# Patient Record
Sex: Female | Born: 1945 | Race: Black or African American | Hispanic: No | Marital: Single
Health system: Midwestern US, Community
[De-identification: ages and names within clinical notes are randomized; demographics above are authoritative.]

## PROBLEM LIST (undated history)

## (undated) DIAGNOSIS — R000 Tachycardia, unspecified: Secondary | ICD-10-CM

## (undated) DIAGNOSIS — I10 Essential (primary) hypertension: Secondary | ICD-10-CM

## (undated) DIAGNOSIS — E119 Type 2 diabetes mellitus without complications: Secondary | ICD-10-CM

## (undated) DIAGNOSIS — J449 Chronic obstructive pulmonary disease, unspecified: Secondary | ICD-10-CM

## (undated) DIAGNOSIS — J40 Bronchitis, not specified as acute or chronic: Secondary | ICD-10-CM

## (undated) DIAGNOSIS — M1A9XX0 Chronic gout, unspecified, without tophus (tophi): Secondary | ICD-10-CM

## (undated) HISTORY — PX: OTHER SURGICAL HISTORY: SHX169

## (undated) HISTORY — PX: CHOLECYSTECTOMY: SHX55

## (undated) HISTORY — PX: ABDOMINAL HYSTERECTOMY: SHX81

---

## 2013-03-11 ENCOUNTER — Emergency Department (HOSPITAL_BASED_OUTPATIENT_CLINIC_OR_DEPARTMENT_OTHER): Payer: Medicare Other

## 2013-03-11 ENCOUNTER — Emergency Department (HOSPITAL_BASED_OUTPATIENT_CLINIC_OR_DEPARTMENT_OTHER)
Admission: EM | Admit: 2013-03-11 | Discharge: 2013-03-11 | Disposition: A | Discharge status: Home / Self Care | Payer: Medicare Other | Attending: Emergency Medicine | Admitting: Emergency Medicine

## 2013-03-11 ENCOUNTER — Encounter (HOSPITAL_BASED_OUTPATIENT_CLINIC_OR_DEPARTMENT_OTHER): Payer: Self-pay

## 2013-03-11 DIAGNOSIS — E119 Type 2 diabetes mellitus without complications: Secondary | ICD-10-CM | POA: Insufficient documentation

## 2013-03-11 DIAGNOSIS — I10 Essential (primary) hypertension: Secondary | ICD-10-CM | POA: Insufficient documentation

## 2013-03-11 DIAGNOSIS — Z794 Long term (current) use of insulin: Secondary | ICD-10-CM | POA: Insufficient documentation

## 2013-03-11 DIAGNOSIS — Z8709 Personal history of other diseases of the respiratory system: Secondary | ICD-10-CM | POA: Insufficient documentation

## 2013-03-11 DIAGNOSIS — R0989 Other specified symptoms and signs involving the circulatory and respiratory systems: Secondary | ICD-10-CM | POA: Insufficient documentation

## 2013-03-11 DIAGNOSIS — J449 Chronic obstructive pulmonary disease, unspecified: Secondary | ICD-10-CM | POA: Insufficient documentation

## 2013-03-11 DIAGNOSIS — Z8679 Personal history of other diseases of the circulatory system: Secondary | ICD-10-CM | POA: Insufficient documentation

## 2013-03-11 DIAGNOSIS — I509 Heart failure, unspecified: Secondary | ICD-10-CM | POA: Insufficient documentation

## 2013-03-11 DIAGNOSIS — J4489 Other specified chronic obstructive pulmonary disease: Secondary | ICD-10-CM | POA: Insufficient documentation

## 2013-03-11 DIAGNOSIS — F17200 Nicotine dependence, unspecified, uncomplicated: Secondary | ICD-10-CM | POA: Insufficient documentation

## 2013-03-11 DIAGNOSIS — R0609 Other forms of dyspnea: Secondary | ICD-10-CM | POA: Insufficient documentation

## 2013-03-11 DIAGNOSIS — R0601 Orthopnea: Secondary | ICD-10-CM | POA: Insufficient documentation

## 2013-03-11 DIAGNOSIS — R0602 Shortness of breath: Secondary | ICD-10-CM | POA: Insufficient documentation

## 2013-03-11 HISTORY — DX: Chronic obstructive pulmonary disease, unspecified: J44.9

## 2013-03-11 HISTORY — DX: Bronchitis, not specified as acute or chronic: J40

## 2013-03-11 HISTORY — DX: Tachycardia, unspecified: R00.0

## 2013-03-11 HISTORY — DX: Essential (primary) hypertension: I10

## 2013-03-11 HISTORY — DX: Type 2 diabetes mellitus without complications: E11.9

## 2013-03-11 LAB — COMPREHENSIVE METABOLIC PANEL
AST: 28 U/L (ref 0–37)
Albumin: 3 g/dL — ABNORMAL LOW (ref 3.5–5.2)
Calcium: 9.3 mg/dL (ref 8.4–10.5)
Creatinine, Ser: 1.4 mg/dL — ABNORMAL HIGH (ref 0.50–1.10)
GFR calc non Af Amer: 38 mL/min — ABNORMAL LOW (ref >90)

## 2013-03-11 LAB — CBC WITH DIFFERENTIAL/PLATELET
Basophils Absolute: 0 10*3/uL (ref 0.0–0.1)
Basophils Relative: 1 % (ref 0–1)
Eosinophils Relative: 1 % (ref 0–5)
HCT: 37.5 % (ref 36.0–46.0)
MCHC: 33.9 g/dL (ref 30.0–36.0)
MCV: 87.2 fL (ref 78.0–100.0)
Monocytes Absolute: 0.4 10*3/uL (ref 0.1–1.0)
RDW: 13.2 % (ref 11.5–15.5)

## 2013-03-11 LAB — PRO B NATRIURETIC PEPTIDE: Pro B Natriuretic peptide (BNP): 19107 pg/mL — ABNORMAL HIGH (ref 0–125)

## 2013-03-11 MED ORDER — FUROSEMIDE 10 MG/ML IJ SOLN
40.0000 mg | Freq: Once | INTRAMUSCULAR | Status: AC
  Administered 2013-03-11: 40 mg via INTRAVENOUS
  Filled 2013-03-11: qty 4

## 2013-03-11 MED ORDER — FUROSEMIDE 10 MG/ML IJ SOLN: 60.0000 mg | Freq: Once | INTRAMUSCULAR | Status: DC

## 2013-03-11 MED ORDER — FUROSEMIDE 20 MG PO TABS: 20.0000 mg | ORAL_TABLET | Freq: Two times a day (BID) | ORAL | Status: DC

## 2013-03-11 NOTE — ED Provider Notes (Addendum)
History     CSN: [id number]  Arrival date & time 03/11/13  1203   First MD Initiated Contact with Patient 03/11/13 1216      Chief Complaint  Patient presents with  . Tachycardia  . Shortness of Breath  . Chest Pain     HPI Patient's had some fast heartbeat associated with shortness of breath worse the last few days.  Also has nocturnal dyspnea and orthopnea.  Patient is a smoker has COPD and has history of tachycardia.  Patient has some very brief chest pains but nothing that lasts for very long. Past Medical History  Diagnosis Date  . COPD (chronic obstructive pulmonary disease)   . Diabetes mellitus without complication   . Hypertension   . Bronchitis   . Tachycardia     Past Surgical History  Procedure Laterality Date  . Abdominal hysterectomy    . Cholecystectomy    . Spleenectomy      No family history on file.  History  Substance Use Topics  . Smoking status: Current Every Day Smoker  . Smokeless tobacco: Not on file  . Alcohol Use: No    OB History   Grav Para Term Preterm Abortions TAB SAB Ect Mult Living                  Review of Systems  Constitutional: Negative for fever.  Cardiovascular: Negative for leg swelling.    Allergies  Penicillins  Home Medications   Current Outpatient Rx  Name  Route  Sig  Dispense  Refill  . ATENOLOL PO   Oral   Take by mouth.         . insulin glargine (LANTUS) 100 UNIT/ML injection   Subcutaneous   Inject 10 Units into the skin every morning.         Marland Kitchen METFORMIN HCL PO   Oral   Take 1,000 mg by mouth.         . Pantoprazole Sodium (PROTONIX PO)   Oral   Take by mouth.         . furosemide (LASIX) 20 MG tablet   Oral   Take 1 tablet (20 mg total) by mouth 2 (two) times daily.   15 tablet   0     There were no vitals taken for this visit.  Physical Exam  Nursing note and vitals reviewed. Constitutional: She is oriented to person, place, and time. She appears well-developed and  well-nourished. No distress.  HENT:  Head: Normocephalic and atraumatic.  Eyes: Pupils are equal, round, and reactive to light.  Neck: Normal range of motion. JVD (Begins at about 45) present.  Cardiovascular: Normal rate and intact distal pulses.   Pulmonary/Chest: No respiratory distress. She has rales (Scattered at the bases).  Abdominal: Normal appearance. She exhibits no distension.  Musculoskeletal: Normal range of motion.  Neurological: She is alert and oriented to person, place, and time. No cranial nerve deficit.  Skin: Skin is warm and dry. No rash noted.  Psychiatric: She has a normal mood and affect. Her behavior is normal.    ED Course  Procedures (including critical care time)  Date: 03/11/2013  Rate: 96  Rhythm: normal sinus rhythm  QRS Axis: normal  Intervals: normal  ST/T Wave abnormalities: normal  Conduction Disutrbances: Poor R. wave progression  Narrative Interpretation: Nonspecific EKG  Meds ordered this encounter  Medications  . DISCONTD: furosemide (LASIX) injection 60 mg    Sig:   . furosemide (LASIX) injection  40 mg    Sig:   . furosemide (LASIX) 20 MG tablet    Sig: Take 1 tablet (20 mg total) by mouth 2 (two) times daily.    Dispense:  15 tablet    Refill:  0      Labs Reviewed  CBC WITH DIFFERENTIAL - Abnormal; Notable for the following:    WBC 3.4 (*)    Neutrophils Relative 30 (*)    Neutro Abs 1.0 (*)    Lymphocytes Relative 59 (*)    All other components within normal limits  COMPREHENSIVE METABOLIC PANEL - Abnormal; Notable for the following:    Sodium 128 (*)    CO2 16 (*)    Glucose, Bld 165 (*)    BUN 28 (*)    Creatinine, Ser 1.40 (*)    Albumin 3.0 (*)    GFR calc non Af Amer 38 (*)    GFR calc Af Amer 44 (*)    All other components within normal limits  PRO B NATRIURETIC PEPTIDE - Abnormal; Notable for the following:    Pro B Natriuretic peptide (BNP) 19107.0 (*)    All other components within normal limits  TROPONIN  I   Dg Chest 2 View  03/11/2013  *RADIOLOGY REPORT*  Clinical Data: Intermittent chest pain, shortness of breath on exertion, heart palpitations, history of COPD, hypertension, diabetes, chronic bronchitis, smoker, tachycardia  CHEST - 2 VIEW  Comparison: None  Findings: Minimal enlargement of cardiac silhouette. Tortuous aorta. Pulmonary vascularity normal. Lungs clear. No pleural effusion or pneumothorax. Minimal central peribronchial thickening. Bones unremarkable. Surgical clips right upper quadrant question cholecystectomy.  IMPRESSION: Minimal enlargement of cardiac silhouette and bronchitic changes. No acute abnormalities.   Original Report Authenticated By: Ulyses Southward, M.D.      1. CHF (congestive heart failure)       MDM  I discussed the findings with the patient and recommended that she be admitted.  Patient refused admission to the hospital but states she would follow up with her doctor tomorrow.  I did give her some Lasix and prescribe some Lasix prior to discharge.  The patient is comfortable with our plan.       Nelia Shi, MD 03/11/13 1346  Nelia Shi, MD 03/11/13 [phone]

## 2013-03-11 NOTE — ED Notes (Signed)
Pt. Has vague c/o palpitations and chest pain.  States she has this constantly since 1991.  C/o SHOB.  Denies n/v.  Denies diaphoresis.

## 2013-03-11 NOTE — ED Notes (Signed)
Pt reports she has a fast heartbeat all the time and "since 1987".  The past 2 days she reports intermittent chest pain, SHOB on exertion and heart palpitations.

## 2014-10-19 DIAGNOSIS — E0500 Thyrotoxicosis with diffuse goiter without thyrotoxic crisis or storm: Secondary | ICD-10-CM | POA: Insufficient documentation

## 2016-11-03 DIAGNOSIS — R6510 Systemic inflammatory response syndrome (SIRS) of non-infectious origin without acute organ dysfunction: Secondary | ICD-10-CM | POA: Insufficient documentation

## 2016-11-03 DIAGNOSIS — I10 Essential (primary) hypertension: Secondary | ICD-10-CM | POA: Insufficient documentation

## 2016-11-03 DIAGNOSIS — N189 Chronic kidney disease, unspecified: Secondary | ICD-10-CM | POA: Insufficient documentation

## 2016-11-03 DIAGNOSIS — I509 Heart failure, unspecified: Secondary | ICD-10-CM | POA: Insufficient documentation

## 2016-11-03 DIAGNOSIS — E119 Type 2 diabetes mellitus without complications: Secondary | ICD-10-CM | POA: Insufficient documentation

## 2016-11-03 DIAGNOSIS — J984 Other disorders of lung: Secondary | ICD-10-CM | POA: Insufficient documentation

## 2016-11-04 DIAGNOSIS — J9601 Acute respiratory failure with hypoxia: Secondary | ICD-10-CM | POA: Insufficient documentation

## 2016-11-04 DIAGNOSIS — J449 Chronic obstructive pulmonary disease, unspecified: Secondary | ICD-10-CM | POA: Insufficient documentation

## 2018-02-13 ENCOUNTER — Other Ambulatory Visit: Payer: Self-pay

## 2018-02-13 ENCOUNTER — Encounter (HOSPITAL_BASED_OUTPATIENT_CLINIC_OR_DEPARTMENT_OTHER): Payer: Self-pay | Admitting: *Deleted

## 2018-02-13 ENCOUNTER — Emergency Department (HOSPITAL_BASED_OUTPATIENT_CLINIC_OR_DEPARTMENT_OTHER): Payer: Medicare Other

## 2018-02-13 ENCOUNTER — Emergency Department (HOSPITAL_BASED_OUTPATIENT_CLINIC_OR_DEPARTMENT_OTHER)
Admission: EM | Admit: 2018-02-13 | Discharge: 2018-02-13 | Discharge status: Against Medical Advice | Payer: Medicare Other | Attending: Emergency Medicine | Admitting: Emergency Medicine

## 2018-02-13 DIAGNOSIS — F17200 Nicotine dependence, unspecified, uncomplicated: Secondary | ICD-10-CM | POA: Insufficient documentation

## 2018-02-13 DIAGNOSIS — Z7982 Long term (current) use of aspirin: Secondary | ICD-10-CM | POA: Diagnosis not present

## 2018-02-13 DIAGNOSIS — J449 Chronic obstructive pulmonary disease, unspecified: Secondary | ICD-10-CM | POA: Diagnosis not present

## 2018-02-13 DIAGNOSIS — Z9049 Acquired absence of other specified parts of digestive tract: Secondary | ICD-10-CM | POA: Insufficient documentation

## 2018-02-13 DIAGNOSIS — Z794 Long term (current) use of insulin: Secondary | ICD-10-CM | POA: Insufficient documentation

## 2018-02-13 DIAGNOSIS — R1010 Upper abdominal pain, unspecified: Secondary | ICD-10-CM | POA: Diagnosis not present

## 2018-02-13 DIAGNOSIS — E119 Type 2 diabetes mellitus without complications: Secondary | ICD-10-CM | POA: Diagnosis not present

## 2018-02-13 DIAGNOSIS — R1013 Epigastric pain: Secondary | ICD-10-CM | POA: Diagnosis present

## 2018-02-13 DIAGNOSIS — I10 Essential (primary) hypertension: Secondary | ICD-10-CM | POA: Diagnosis not present

## 2018-02-13 DIAGNOSIS — Z79899 Other long term (current) drug therapy: Secondary | ICD-10-CM | POA: Insufficient documentation

## 2018-02-13 LAB — CBC WITH DIFFERENTIAL/PLATELET
Basophils Absolute: 0 10*3/uL (ref 0.0–0.1)
Basophils Relative: 0 %
EOS ABS: 0 10*3/uL (ref 0.0–0.7)
EOS PCT: 0 %
HCT: 40.5 % (ref 36.0–46.0)
HEMOGLOBIN: 14.2 g/dL (ref 12.0–15.0)
LYMPHS ABS: 2 10*3/uL (ref 0.7–4.0)
LYMPHS PCT: 27 %
MCH: 31.6 pg (ref 26.0–34.0)
MCHC: 35.1 g/dL (ref 30.0–36.0)
MCV: 90.2 fL (ref 78.0–100.0)
MONOS PCT: 8 %
Monocytes Absolute: 0.6 10*3/uL (ref 0.1–1.0)
Neutro Abs: 4.6 10*3/uL (ref 1.7–7.7)
Neutrophils Relative %: 65 %
PLATELETS: 198 10*3/uL (ref 150–400)
RBC: 4.49 MIL/uL (ref 3.87–5.11)
RDW: 15.1 % (ref 11.5–15.5)
WBC: 7.2 10*3/uL (ref 4.0–10.5)

## 2018-02-13 LAB — COMPREHENSIVE METABOLIC PANEL
ALK PHOS: 83 U/L (ref 38–126)
ALT: 17 U/L (ref 14–54)
ANION GAP: 8 (ref 5–15)
AST: 21 U/L (ref 15–41)
Albumin: 3.5 g/dL (ref 3.5–5.0)
BILIRUBIN TOTAL: 0.8 mg/dL (ref 0.3–1.2)
BUN: 61 mg/dL — ABNORMAL HIGH (ref 6–20)
CALCIUM: 9.1 mg/dL (ref 8.9–10.3)
CO2: 21 mmol/L — ABNORMAL LOW (ref 22–32)
Chloride: 98 mmol/L — ABNORMAL LOW (ref 101–111)
Creatinine, Ser: 2.02 mg/dL — ABNORMAL HIGH (ref 0.44–1.00)
GFR, EST AFRICAN AMERICAN: 27 mL/min — AB (ref >60)
GFR, EST NON AFRICAN AMERICAN: 23 mL/min — AB (ref >60)
Glucose, Bld: 186 mg/dL — ABNORMAL HIGH (ref 65–99)
Potassium: 5.6 mmol/L — ABNORMAL HIGH (ref 3.5–5.1)
SODIUM: 127 mmol/L — AB (ref 135–145)
Total Protein: 8.1 g/dL (ref 6.5–8.1)

## 2018-02-13 LAB — I-STAT CG4 LACTIC ACID, ED: Lactic Acid, Venous: 0.8 mmol/L (ref 0.5–1.9)

## 2018-02-13 LAB — LIPASE, BLOOD: LIPASE: 99 U/L — AB (ref 11–51)

## 2018-02-13 LAB — CBG MONITORING, ED: GLUCOSE-CAPILLARY: 105 mg/dL — AB (ref 65–99)

## 2018-02-13 LAB — TROPONIN I: Troponin I: <0.03 ng/mL (ref <0.03)

## 2018-02-13 MED ORDER — FENTANYL CITRATE (PF) 100 MCG/2ML IJ SOLN
25.0000 ug | Freq: Once | INTRAMUSCULAR | Status: AC
  Administered 2018-02-13: 25 ug via INTRAVENOUS
  Filled 2018-02-13: qty 2

## 2018-02-13 MED ORDER — INSULIN ASPART 100 UNIT/ML IV SOLN
10.0000 [IU] | Freq: Once | INTRAVENOUS | Status: AC
Start: 2018-02-13 — End: 2018-02-13
  Administered 2018-02-13: 10 [IU] via INTRAVENOUS
  Filled 2018-02-13: qty 1

## 2018-02-13 MED ORDER — DEXTROSE 50 % IV SOLN
1.0000 | Freq: Once | INTRAVENOUS | Status: AC
  Administered 2018-02-13: 50 mL via INTRAVENOUS
  Filled 2018-02-13: qty 50

## 2018-02-13 MED ORDER — SODIUM BICARBONATE 8.4 % IV SOLN
INTRAVENOUS | Status: AC
  Filled 2018-02-13: qty 50

## 2018-02-13 MED ORDER — SODIUM CHLORIDE 0.9 % IV SOLN
INTRAVENOUS | Status: AC
  Filled 2018-02-13: qty 10

## 2018-02-13 MED ORDER — SODIUM CHLORIDE 0.9 % IV SOLN
1.0000 g | Freq: Once | INTRAVENOUS | Status: AC
  Administered 2018-02-13: 1 g via INTRAVENOUS
  Filled 2018-02-13: qty 10

## 2018-02-13 MED ORDER — SODIUM BICARBONATE 8.4 % IV SOLN
50.0000 meq | Freq: Once | INTRAVENOUS | Status: AC
Start: 2018-02-13 — End: 2018-02-13
  Administered 2018-02-13: 50 meq via INTRAVENOUS
  Filled 2018-02-13: qty 50

## 2018-02-13 MED ORDER — ONDANSETRON HCL 4 MG/2ML IJ SOLN
4.0000 mg | Freq: Once | INTRAMUSCULAR | Status: AC
  Administered 2018-02-13: 4 mg via INTRAVENOUS
  Filled 2018-02-13: qty 2

## 2018-02-13 MED ORDER — SODIUM CHLORIDE 0.9 % IV BOLUS (SEPSIS)
1000.0000 mL | Freq: Once | INTRAVENOUS | Status: AC
  Administered 2018-02-13: 1000 mL via INTRAVENOUS

## 2018-02-13 MED ORDER — SODIUM CHLORIDE 0.9 % IV BOLUS (SEPSIS): 500.0000 mL | Freq: Once | INTRAVENOUS | Status: DC

## 2018-02-13 NOTE — ED Provider Notes (Signed)
[facility] EMERGENCY DEPARTMENT Provider Note   CSN: [id number] Arrival date & time: 02/13/18  1003     History   Chief Complaint Chief Complaint  Patient presents with  . Abdominal Pain    HPI Joyce Palmer is a 72 y.o. female.  The history is provided by the patient and medical records. No language interpreter was used.  Abdominal Pain   Associated symptoms include nausea. Pertinent negatives include diarrhea, vomiting and constipation.  Joyce Palmer is a 72 y.o. female  with a PMH of prior episode of pancreatitis leading to cholecystectomy, hypertension, diabetes, possible chronic kidney disease, congestive heart failure unknown ejection fraction, Graves' diseasewho presents to the Emergency Department complaining of epigastric pain over the last 2 weeks. Unable to characterize pain. Feels as if pain has gotten worse over the last 3-4 days. She has not been taking any medication other than her Zantac due to pain. Pain is worse with eating. She is on Atenolol and has not taken this medication in 2 weeks, again due to the pain. She has been nauseous, but not having any vomiting or diarrhea. No fever, chills, shortness of breath, chest pain.    Past Medical History:  Diagnosis Date  . Bronchitis   . COPD (chronic obstructive pulmonary disease) (HCC)   . Diabetes mellitus without complication (HCC)   . Hypertension   . Tachycardia     There are no active problems to display for this patient.   Past Surgical History:  Procedure Laterality Date  . ABDOMINAL HYSTERECTOMY    . CHOLECYSTECTOMY    . spleenectomy      OB History    No data available       Home Medications    Prior to Admission medications   Medication Sig Start Date End Date Taking? Authorizing Provider  amitriptyline (ELAVIL) 50 MG tablet Take 50 mg by mouth at bedtime.   Yes [provider]  aspirin 325 MG tablet Take 325 mg by mouth daily.   Yes [provider]    meloxicam (MOBIC) 15 MG tablet Take 15 mg by mouth daily.   Yes [provider]  methocarbamol (ROBAXIN) 500 MG tablet Take 500 mg by mouth 4 (four) times daily.   Yes [provider]  oxyCODONE-acetaminophen (PERCOCET) 7.5-325 MG tablet Take 1 tablet by mouth every 4 (four) hours as needed for severe pain.   Yes [provider]  pioglitazone (ACTOS) 30 MG tablet Take 30 mg by mouth daily.   Yes [provider]  ranitidine (ZANTAC) 150 MG capsule Take 150 mg by mouth 2 (two) times daily.   Yes [provider]  sertraline (ZOLOFT) 50 MG tablet Take 50 mg by mouth daily.   Yes [provider]  Vitamin D, Ergocalciferol, (DRISDOL) 50000 units CAPS capsule Take 50,000 Units by mouth every 7 (seven) days.   Yes [provider]  ATENOLOL PO Take by mouth.    [provider]  furosemide (LASIX) 20 MG tablet Take 1 tablet (20 mg total) by mouth 2 (two) times daily. 03/11/13   Nelva NayBeaton, Robert, MD  insulin glargine (LANTUS) 100 UNIT/ML injection Inject 10 Units into the skin every morning.    [provider]  METFORMIN HCL PO Take 1,000 mg by mouth.    [provider]  Pantoprazole Sodium (PROTONIX PO) Take by mouth.    [provider]    Family History History reviewed. No pertinent family history.  Social History Social  History   Tobacco Use  . Smoking status: Current Every Day Smoker  . Smokeless tobacco: Never Used  Substance Use Topics  . Alcohol use: No  . Drug use: No     Allergies   Penicillins   Review of Systems Review of Systems  Gastrointestinal: Positive for abdominal pain and nausea. Negative for constipation, diarrhea and vomiting.  All other systems reviewed and are negative.    Physical Exam Updated Vital Signs BP (!) 150/84   Pulse (!) 111   Temp 98.1 F (36.7 C) (Oral)   Resp 17   SpO2 95%   Physical Exam  Constitutional: She is oriented to person, place, and  time. She appears well-developed and well-nourished. No distress.  HENT:  Head: Normocephalic and atraumatic.  Neck: Neck supple.  Cardiovascular: Normal rate, regular rhythm and normal heart sounds.  No murmur heard. Pulmonary/Chest: Effort normal and breath sounds normal. No respiratory distress.  Abdominal: Soft. She exhibits no distension.  Diffuse tenderness to palpation of the upper abdomen. Negative Murphy's. No rebound or guarding.  Neurological: She is alert and oriented to person, place, and time.  Skin: Skin is warm and dry.  Nursing note and vitals reviewed.    ED Treatments / Results  Labs (all labs ordered are listed, but only abnormal results are displayed) Labs Reviewed  COMPREHENSIVE METABOLIC PANEL - Abnormal; Notable for the following components:      Result Value   Sodium 127 (*)    Potassium 5.6 (*)    Chloride 98 (*)    CO2 21 (*)    Glucose, Bld 186 (*)    BUN 61 (*)    Creatinine, Ser 2.02 (*)    GFR calc non Af Amer 23 (*)    GFR calc Af Amer 27 (*)    All other components within normal limits  LIPASE, BLOOD - Abnormal; Notable for the following components:   Lipase 99 (*)    All other components within normal limits  CBG MONITORING, ED - Abnormal; Notable for the following components:   Glucose-Capillary 105 (*)    All other components within normal limits  CBC WITH DIFFERENTIAL/PLATELET  TROPONIN I  I-STAT CG4 LACTIC ACID, ED  I-STAT CG4 LACTIC ACID, ED    EKG  EKG Interpretation  Date/Time:  Thursday February 13 2018 10:22:43 EST Ventricular Rate:  126 PR Interval:    QRS Duration: 86 QT Interval:  309 QTC Calculation: 448 R Axis:   36 Text Interpretation:  Sinus tachycardia Confirmed by Raeford Razor [phone]) on 02/13/2018 11:54:39 AM       Radiology Ct Abdomen Pelvis Wo Contrast  Result Date: 02/13/2018 CLINICAL DATA:  Acute epigastric abdominal pain. EXAM: CT ABDOMEN AND PELVIS WITHOUT CONTRAST TECHNIQUE: Multidetector CT imaging  of the abdomen and pelvis was performed following the standard protocol without IV contrast. COMPARISON:  None. FINDINGS: Lower chest: No acute abnormality. Hepatobiliary: No focal liver abnormality is seen. Status post cholecystectomy. No biliary dilatation. Pancreas: Unremarkable. No pancreatic ductal dilatation or surrounding inflammatory changes. Spleen: Normal in size without focal abnormality. Adrenals/Urinary Tract: Adrenal glands are unremarkable. Small left-sided renal stone is noted. Exophytic left renal cyst is noted. No hydronephrosis or renal obstruction is noted. Urinary bladder is unremarkable. Stomach/Bowel: Stomach is within normal limits. Appendix appears normal. No evidence of bowel wall thickening, distention, or inflammatory changes. Vascular/Lymphatic: Aortic atherosclerosis. No enlarged abdominal or pelvic lymph nodes. Reproductive: Status post hysterectomy. No adnexal masses. Other: No abdominal wall hernia or abnormality.  No abdominopelvic ascites. Musculoskeletal: No acute or significant osseous findings. IMPRESSION: Nonobstructive left nephrolithiasis. No hydronephrosis or renal obstruction is noted. No acute abnormality seen in the abdomen or pelvis. Aortic Atherosclerosis (ICD10-I70.0). Electronically Signed   By: Lupita Raider, M.D.   On: 02/13/2018 15:18    Procedures Procedures (including critical care time)  Medications Ordered in ED Medications  sodium chloride 0.9 % with calcium gluconate ADS Med (not administered)  ondansetron (ZOFRAN) injection 4 mg (4 mg Intravenous Given 02/13/18 1128)  fentaNYL (SUBLIMAZE) injection 25 mcg (25 mcg Intravenous Given 02/13/18 1128)  sodium chloride 0.9 % bolus 1,000 mL (0 mLs Intravenous Stopped 02/13/18 1522)  calcium chloride 1 g in sodium chloride 0.9 % 100 mL IVPB (0 g Intravenous Stopped 02/13/18 1356)  sodium bicarbonate injection 50 mEq (50 mEq Intravenous Given 02/13/18 1343)  insulin aspart (novoLOG) injection 10 Units (10 Units  Intravenous Given 02/13/18 1344)  dextrose 50 % solution 50 mL (50 mLs Intravenous Given 02/13/18 1344)     Initial Impression / Assessment and Plan / ED Course  I have reviewed the triage vital signs and the nursing notes.  Pertinent labs & imaging results that were available during my care of the patient were reviewed by me and considered in my medical decision making (see chart for details).    JASLYNE BEECK is a 72 y.o. female who presents to ED for upper abdominal pain which has been worsening over the last 2 weeks. She is tachycardic. She has not taken atenolol in the last two weeks, so tachycardia could be related to this vs. Infectious etiology. She is afebrile in ED today. She has tenderness across upper abdomen but no peritoneal signs. Will obtain labs and CT scan for further evaluation.   Lactic and wbc count normal.  CMP reviewed with multiple abnormalities. BUN and Cr elevated. Hyponatremic at 127 and hyperkalemia at 5.6. Fluids, calcium, insulin/dextrose and bicarb given for hyperkalemia.   CT with no acute findings. Given multiple lab abnormalities and persistent symptoms, recommended admission.   Patient seen by and discussed with Dr. Juleen China who agrees with treatment plan.   Discussed admission recommendations with patient who would like to go home.   I have discussed my concerns as a provider and the possibility that this may worsen. I spoke with family in the room as well. We discussed the nature, risks and benefits, and alternatives to treatment. I have specifically discussed that without further evaluation I cannot guarantee there is not a life threatening event occuring.  Time was given to allow the opportunity to ask questions. I gave patient time to discuss option with family as well and after the discussion, the patient decided to refuse admission. Pt is A&Ox4, her own POA and states understanding of my concerns and the possible consequences. After refusal, I made every  reasonable opportunity to treat them to the best of my ability. I discussed that she must  I have made the patient aware that this is an AMA discharge, but he may return at any time for further evaluation and treatment.   Discussed plan of care change with oncoming attending, Dr. Rush Landmark who agrees with plan as well.           Final Clinical Impressions(s) / ED Diagnoses   Final diagnoses:  Pain of upper abdomen    ED Discharge Orders    None       Cataleya Cristina, Chase Picket, PA-C 02/13/18 1751    Raeford Razor, MD  02/14/18 0813  

## 2018-02-13 NOTE — ED Triage Notes (Signed)
Pt reports epigastric pains with nauea, no vomitting, states she is having good bm's "2 today". ekg performed while pt being triaged, hr noted at 130's. Pt denies any cp,denies any sob beyond her usual sob. md at bedside for eval.

## 2018-02-13 NOTE — Discharge Instructions (Signed)
It was my pleasure taking care of you today!   You must follow up with your primary care doctor for recheck of your lab work today. I especially want them to recheck your potassium and kidney function.   Return to ER immediately for fever, vomiting, new or worsening symptoms or any additional concerns. As we discussed, you can return to ER at any point!

## 2021-01-17 DIAGNOSIS — E871 Hypo-osmolality and hyponatremia: Secondary | ICD-10-CM | POA: Insufficient documentation

## 2021-01-25 ENCOUNTER — Encounter (HOSPITAL_BASED_OUTPATIENT_CLINIC_OR_DEPARTMENT_OTHER): Payer: Self-pay | Admitting: Radiology

## 2021-01-25 ENCOUNTER — Other Ambulatory Visit: Payer: Self-pay

## 2021-01-25 ENCOUNTER — Emergency Department (HOSPITAL_BASED_OUTPATIENT_CLINIC_OR_DEPARTMENT_OTHER): Payer: Medicare Other

## 2021-01-25 ENCOUNTER — Inpatient Hospital Stay (HOSPITAL_BASED_OUTPATIENT_CLINIC_OR_DEPARTMENT_OTHER)
Admission: EM | Admit: 2021-01-25 | Discharge: 2021-01-30 | DRG: 175 | Disposition: A | Discharge status: Home Health | Payer: Medicare Other | Attending: Internal Medicine | Admitting: Internal Medicine

## 2021-01-25 DIAGNOSIS — Z9049 Acquired absence of other specified parts of digestive tract: Secondary | ICD-10-CM

## 2021-01-25 DIAGNOSIS — R4701 Aphasia: Secondary | ICD-10-CM | POA: Diagnosis present

## 2021-01-25 DIAGNOSIS — R4182 Altered mental status, unspecified: Secondary | ICD-10-CM

## 2021-01-25 DIAGNOSIS — R531 Weakness: Secondary | ICD-10-CM

## 2021-01-25 DIAGNOSIS — E876 Hypokalemia: Secondary | ICD-10-CM | POA: Diagnosis present

## 2021-01-25 DIAGNOSIS — I110 Hypertensive heart disease with heart failure: Secondary | ICD-10-CM | POA: Diagnosis present

## 2021-01-25 DIAGNOSIS — E860 Dehydration: Secondary | ICD-10-CM | POA: Diagnosis present

## 2021-01-25 DIAGNOSIS — I071 Rheumatic tricuspid insufficiency: Secondary | ICD-10-CM | POA: Diagnosis present

## 2021-01-25 DIAGNOSIS — Z9081 Acquired absence of spleen: Secondary | ICD-10-CM

## 2021-01-25 DIAGNOSIS — Z20822 Contact with and (suspected) exposure to covid-19: Secondary | ICD-10-CM | POA: Diagnosis present

## 2021-01-25 DIAGNOSIS — E440 Moderate protein-calorie malnutrition: Secondary | ICD-10-CM | POA: Diagnosis present

## 2021-01-25 DIAGNOSIS — R000 Tachycardia, unspecified: Secondary | ICD-10-CM | POA: Diagnosis present

## 2021-01-25 DIAGNOSIS — F419 Anxiety disorder, unspecified: Secondary | ICD-10-CM | POA: Diagnosis present

## 2021-01-25 DIAGNOSIS — E039 Hypothyroidism, unspecified: Secondary | ICD-10-CM | POA: Diagnosis present

## 2021-01-25 DIAGNOSIS — J449 Chronic obstructive pulmonary disease, unspecified: Secondary | ICD-10-CM | POA: Diagnosis present

## 2021-01-25 DIAGNOSIS — I2693 Single subsegmental pulmonary embolism without acute cor pulmonale: Principal | ICD-10-CM | POA: Diagnosis present

## 2021-01-25 DIAGNOSIS — M25561 Pain in right knee: Secondary | ICD-10-CM | POA: Diagnosis present

## 2021-01-25 DIAGNOSIS — F17200 Nicotine dependence, unspecified, uncomplicated: Secondary | ICD-10-CM | POA: Diagnosis present

## 2021-01-25 DIAGNOSIS — G8929 Other chronic pain: Secondary | ICD-10-CM | POA: Diagnosis present

## 2021-01-25 DIAGNOSIS — J9601 Acute respiratory failure with hypoxia: Secondary | ICD-10-CM | POA: Diagnosis not present

## 2021-01-25 DIAGNOSIS — E871 Hypo-osmolality and hyponatremia: Secondary | ICD-10-CM | POA: Diagnosis present

## 2021-01-25 DIAGNOSIS — R5381 Other malaise: Secondary | ICD-10-CM | POA: Diagnosis present

## 2021-01-25 DIAGNOSIS — Z681 Body mass index (BMI) 19 or less, adult: Secondary | ICD-10-CM

## 2021-01-25 DIAGNOSIS — Z7984 Long term (current) use of oral hypoglycemic drugs: Secondary | ICD-10-CM

## 2021-01-25 DIAGNOSIS — Z79899 Other long term (current) drug therapy: Secondary | ICD-10-CM

## 2021-01-25 DIAGNOSIS — E119 Type 2 diabetes mellitus without complications: Secondary | ICD-10-CM | POA: Diagnosis present

## 2021-01-25 DIAGNOSIS — R471 Dysarthria and anarthria: Secondary | ICD-10-CM | POA: Diagnosis present

## 2021-01-25 DIAGNOSIS — G252 Other specified forms of tremor: Secondary | ICD-10-CM | POA: Diagnosis present

## 2021-01-25 DIAGNOSIS — G9341 Metabolic encephalopathy: Secondary | ICD-10-CM | POA: Diagnosis present

## 2021-01-25 DIAGNOSIS — E785 Hyperlipidemia, unspecified: Secondary | ICD-10-CM | POA: Diagnosis present

## 2021-01-25 DIAGNOSIS — R52 Pain, unspecified: Secondary | ICD-10-CM

## 2021-01-25 DIAGNOSIS — Z90710 Acquired absence of both cervix and uterus: Secondary | ICD-10-CM

## 2021-01-25 DIAGNOSIS — N179 Acute kidney failure, unspecified: Secondary | ICD-10-CM | POA: Diagnosis present

## 2021-01-25 DIAGNOSIS — F32A Depression, unspecified: Secondary | ICD-10-CM | POA: Diagnosis present

## 2021-01-25 DIAGNOSIS — I5032 Chronic diastolic (congestive) heart failure: Secondary | ICD-10-CM | POA: Diagnosis present

## 2021-01-25 LAB — HEPATIC FUNCTION PANEL
ALT: 13 U/L (ref 0–44)
AST: 20 U/L (ref 15–41)
Albumin: 3.2 g/dL — ABNORMAL LOW (ref 3.5–5.0)
Alkaline Phosphatase: 50 U/L (ref 38–126)
Bilirubin, Direct: 0.4 mg/dL — ABNORMAL HIGH (ref 0.0–0.2)
Indirect Bilirubin: 0.7 mg/dL (ref 0.3–0.9)
Total Bilirubin: 1.1 mg/dL (ref 0.3–1.2)
Total Protein: 7.9 g/dL (ref 6.5–8.1)

## 2021-01-25 LAB — BASIC METABOLIC PANEL
Anion gap: 11 (ref 5–15)
BUN: 21 mg/dL (ref 8–23)
CO2: 25 mmol/L (ref 22–32)
Calcium: 8.7 mg/dL — ABNORMAL LOW (ref 8.9–10.3)
Chloride: 95 mmol/L — ABNORMAL LOW (ref 98–111)
Creatinine, Ser: 1.14 mg/dL — ABNORMAL HIGH (ref 0.44–1.00)
GFR, Estimated: 50 mL/min — ABNORMAL LOW (ref >60)
Glucose, Bld: 139 mg/dL — ABNORMAL HIGH (ref 70–99)
Potassium: 4.3 mmol/L (ref 3.5–5.1)
Sodium: 131 mmol/L — ABNORMAL LOW (ref 135–145)

## 2021-01-25 LAB — URINALYSIS, ROUTINE W REFLEX MICROSCOPIC
Bilirubin Urine: NEGATIVE
Glucose, UA: NEGATIVE mg/dL
Hgb urine dipstick: NEGATIVE
Ketones, ur: NEGATIVE mg/dL
Leukocytes,Ua: NEGATIVE
Nitrite: NEGATIVE
Protein, ur: NEGATIVE mg/dL
Specific Gravity, Urine: 1.005 (ref 1.005–1.030)
pH: 7.5 (ref 5.0–8.0)

## 2021-01-25 LAB — RESP PANEL BY RT-PCR (FLU A&B, COVID) ARPGX2
Influenza A by PCR: NEGATIVE
Influenza B by PCR: NEGATIVE
SARS Coronavirus 2 by RT PCR: NEGATIVE

## 2021-01-25 LAB — CBC
HCT: 37.7 % (ref 36.0–46.0)
Hemoglobin: 12.5 g/dL (ref 12.0–15.0)
MCH: 29.2 pg (ref 26.0–34.0)
MCHC: 33.2 g/dL (ref 30.0–36.0)
MCV: 88.1 fL (ref 80.0–100.0)
Platelets: 270 10*3/uL (ref 150–400)
RBC: 4.28 MIL/uL (ref 3.87–5.11)
RDW: 18.1 % — ABNORMAL HIGH (ref 11.5–15.5)
WBC: 7.4 10*3/uL (ref 4.0–10.5)
nRBC: 0 % (ref 0.0–0.2)

## 2021-01-25 LAB — CBG MONITORING, ED: Glucose-Capillary: 135 mg/dL — ABNORMAL HIGH (ref 70–99)

## 2021-01-25 LAB — TROPONIN I (HIGH SENSITIVITY): Troponin I (High Sensitivity): 9 ng/L (ref <18)

## 2021-01-25 LAB — LACTIC ACID, PLASMA: Lactic Acid, Venous: 0.8 mmol/L (ref 0.5–1.9)

## 2021-01-25 MED ORDER — SODIUM CHLORIDE 0.9 % IV BOLUS
1000.0000 mL | Freq: Once | INTRAVENOUS | Status: AC
  Administered 2021-01-25: 1000 mL via INTRAVENOUS

## 2021-01-25 MED ORDER — IOHEXOL 350 MG/ML SOLN
75.0000 mL | Freq: Once | INTRAVENOUS | Status: AC | PRN
  Administered 2021-01-25: 75 mL via INTRAVENOUS

## 2021-01-25 NOTE — ED Notes (Signed)
Niece Misty Stanley updated on patient. [phone]

## 2021-01-25 NOTE — ED Triage Notes (Addendum)
Pt arrives pov with niece with report of right side weakness since 0730 today. Dr Clarene Duke notified

## 2021-01-25 NOTE — ED Notes (Signed)
1# contact Steward Drone  [phone]

## 2021-01-25 NOTE — ED Notes (Signed)
Pt awake alert called out for bedpan

## 2021-01-25 NOTE — Progress Notes (Signed)
Patient accepted to [facility]. [facility] hospitalists to assume care upon arrival to accepting facility.

## 2021-01-25 NOTE — ED Notes (Signed)
Patient transported to CT 

## 2021-01-25 NOTE — ED Provider Notes (Signed)
[facility] EMERGENCY DEPARTMENT Provider Note   CSN: [id number] Arrival date & time: 01/25/21  1504     History Chief Complaint  Patient presents with  . Weakness    Joyce Palmer is a 75 y.o. female PMH/o COPD, DM, HTN who presents for evaluation of weakness.  Patient is accompanied by her niece who states that another aunt came and checked on the patient this morning around 730 and noted her to be weak.  They reported that she was sitting on the toilet and was having difficulty getting up because she was so weak.  They state that this is not her normal baseline.  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  They do not know when she was last seen normal.  They do not know she has fallen.  They are unsure if she is on a blood thinner.  Patient states she just feels "bad."  She has trouble verbalizing exactly what she means.  She does feel like her chest is hurting and she feels nauseous.  She denies any abdominal pain.  EM LEVEL 5 CAVEAT DUE TO ACUITY TO CONDITION   The history is provided by the patient.       Past Medical History:  Diagnosis Date  . Bronchitis   . COPD (chronic obstructive pulmonary disease) (HCC)   . Diabetes mellitus without complication (HCC)   . Hypertension   . Tachycardia     Patient Active Problem List   Diagnosis Date Noted  . Acute metabolic encephalopathy 01/25/2021    Past Surgical History:  Procedure Laterality Date  . ABDOMINAL HYSTERECTOMY    . CHOLECYSTECTOMY    . spleenectomy       OB History   No obstetric history on file.     No family history on file.  Social History   Tobacco Use  . Smoking status: Current Every Day Smoker  . Smokeless tobacco: Never Used  Substance Use Topics  . Alcohol use: No  . Drug use: No    Home Medications Prior to Admission medications   Medication Sig Start Date End Date Taking? Authorizing  Provider  alendronate (FOSAMAX) 70 MG tablet Take by mouth.    [provider]  allopurinol (ZYLOPRIM) 100 MG tablet Take by mouth.    [provider]  amitriptyline (ELAVIL) 50 MG tablet Take 50 mg by mouth at bedtime.    [provider]  aspirin 325 MG tablet Take 325 mg by mouth daily.    [provider]  ATENOLOL PO Take by mouth.    [provider]  furosemide (LASIX) 20 MG tablet Take 1 tablet (20 mg total) by mouth 2 (two) times daily. 03/11/13   Nelva Nay, MD  insulin glargine (LANTUS) 100 UNIT/ML injection Inject 10 Units into the skin every morning.    [provider]  meloxicam (MOBIC) 15 MG tablet Take 15 mg by mouth daily.    [provider]  METFORMIN HCL PO Take 1,000 mg by mouth.    [provider]  methocarbamol (ROBAXIN) 500 MG tablet Take 500 mg by mouth 4 (four) times daily.    [provider]  oxyCODONE-acetaminophen (PERCOCET) 7.5-325 MG tablet Take 1 tablet by mouth every 4 (four) hours as needed for severe pain.    [provider]  Pantoprazole Sodium (PROTONIX PO) Take by mouth.    [provider]  pioglitazone (ACTOS) 30 MG tablet Take 30 mg by mouth daily.    [provider]  ranitidine (ZANTAC) 150 MG capsule Take 150 mg by mouth 2 (two) times daily.    [provider]  sertraline (ZOLOFT) 50 MG tablet Take 50 mg by mouth daily.    [provider]  Vitamin D, Ergocalciferol, (DRISDOL) 50000 units CAPS capsule Take 50,000 Units by mouth every 7 (seven) days.    [provider]    Allergies    Penicillins  Review of Systems   Review of Systems  Unable to perform ROS: Acuity of condition    Physical Exam Updated Vital Signs BP 129/86 (BP Location: Right Arm)   Pulse (!) 120   Temp 98.5 F (36.9 C) (Rectal) Comment: assisted with EMT Tanya  Resp 12   Wt 48.1 kg   SpO2 98%   Physical Exam Vitals and nursing note  reviewed.  Constitutional:      Appearance: Normal appearance. She is well-developed and well-nourished.     Comments: Frail and elderly appearing.   HENT:     Head: Normocephalic and atraumatic.     Mouth/Throat:     Mouth: Oropharynx is clear and moist and mucous membranes are normal.  Eyes:     General: Lids are normal.     Extraocular Movements: EOM normal.     Conjunctiva/sclera: Conjunctivae normal.     Pupils: Pupils are equal, round, and reactive to light.     Comments: PERRL. EOMs intact. No nystagmus. No neglect.   Cardiovascular:     Rate and Rhythm: Regular rhythm. Tachycardia present.     Pulses: Normal pulses.     Heart sounds: Normal heart sounds. No murmur heard. No friction rub. No gallop.   Pulmonary:     Effort: Pulmonary effort is normal.     Breath sounds: Normal breath sounds.     Comments: Limited evaluation due to poor effort. No obvious wheezing.  Abdominal:     Palpations: Abdomen is soft. Abdomen is not rigid.     Tenderness: There is no abdominal tenderness. There is no guarding.     Comments: Abdomen is soft, non-distended, non-tender. No rigidity, No guarding. No peritoneal signs.  Musculoskeletal:        General: Normal range of motion.     Cervical back: Full passive range of motion without pain.  Skin:    General: Skin is warm and dry.     Capillary Refill: Capillary refill takes less than 2 seconds.  Neurological:     Mental Status: She is alert.     Comments: Alert and oriented x2.  She can tell me her name and where she is at but when asked her what year she can states "2, 2, 2, 2." Generalized weakness with no focal point.  Equal and symmetric grip strength.  Equal strength noted to bilateral upper and lower extremities.  Difficulty obtaining full neuro exam secondary to patient's cooperation.  Psychiatric:        Mood and Affect: Mood and affect normal.        Speech: Speech normal.     ED Results / Procedures / Treatments   Labs (all  labs ordered are listed, but only abnormal results are displayed) Labs Reviewed  BASIC METABOLIC PANEL - Abnormal; Notable for the following components:      Result Value   Sodium 131 (*)    Chloride 95 (*)    Glucose, Bld 139 (*)  Creatinine, Ser 1.14 (*)    Calcium 8.7 (*)    GFR, Estimated 50 (*)    All other components within normal limits  CBC - Abnormal; Notable for the following components:   RDW 18.1 (*)    All other components within normal limits  HEPATIC FUNCTION PANEL - Abnormal; Notable for the following components:   Albumin 3.2 (*)    Bilirubin, Direct 0.4 (*)    All other components within normal limits  CBG MONITORING, ED - Abnormal; Notable for the following components:   Glucose-Capillary 135 (*)    All other components within normal limits  RESP PANEL BY RT-PCR (FLU A&B, COVID) [id number]  URINALYSIS, ROUTINE W REFLEX MICROSCOPIC  LACTIC ACID, PLASMA  LACTIC ACID, PLASMA  TSH  TROPONIN I (HIGH SENSITIVITY)    EKG EKG Interpretation  Date/Time:  Wednesday January 25 2021 15:07:40 EST Ventricular Rate:  142 PR Interval:  122 QRS Duration: 62 QT Interval:  298 QTC Calculation: 458 R Axis:   48 Text Interpretation: Sinus tachycardia Right atrial enlargement Borderline ECG Interpretation limited secondary to artifact overall similar to previous Confirmed by Frederick Peers [phone]) on 01/25/2021 6:10:03 PM   Radiology CT Head Wo Contrast  Result Date: 01/25/2021 CLINICAL DATA:  Mental status change, unknown cause. Additional history provided: Right-sided weakness since 07:30 today. EXAM: CT HEAD WITHOUT CONTRAST TECHNIQUE: Contiguous axial images were obtained from the base of the skull through the vertex without intravenous contrast. COMPARISON:  No pertinent prior exams available for comparison. FINDINGS: Brain: Moderate cerebral atrophy. Associated prominence of the ventricles and sulci. Comparatively mild cerebellar atrophy. Moderate ill-defined  hypoattenuation within the cerebral white matter is nonspecific, but compatible with chronic small vessel ischemic disease. There is no acute intracranial hemorrhage. No demarcated cortical infarct. No extra-axial fluid collection. No evidence of intracranial mass. No midline shift. Vascular: No hyperdense vessel. Atherosclerotic calcifications. Skull: Normal. Negative for fracture or focal lesion. Sinuses/Orbits: Visualized orbits show no acute finding. Complete opacification of the partially imaged right maxillary sinus. Extensive partial opacification of the partially imaged left maxillary sinus. Moderate ethmoid sinus mucosal thickening. IMPRESSION: No CT evidence of acute intracranial abnormality. Moderate cerebral atrophy and chronic small vessel ischemic disease. Comparatively mild cerebellar atrophy. Bilateral ethmoid and maxillary paranasal sinus disease at the imaged levels, as described. Electronically Signed   By: Jackey Loge DO   On: 01/25/2021 16:55   CT Angio Chest PE W and/or Wo Contrast  Result Date: 01/25/2021 CLINICAL DATA:  Right-sided weakness since 07:30 today. Concern for PE. Abdominal pain. Prior hysterectomy and cholecystectomy. EXAM: CT ANGIOGRAPHY CHEST CT ABDOMEN AND PELVIS WITH CONTRAST TECHNIQUE: Multidetector CT imaging of the chest was performed using the standard protocol during bolus administration of intravenous contrast. Multiplanar CT image reconstructions and MIPs were obtained to evaluate the vascular anatomy. Multidetector CT imaging of the abdomen and pelvis was performed using the standard protocol during bolus administration of intravenous contrast. CONTRAST:  68mL OMNIPAQUE IOHEXOL 350 MG/ML SOLN COMPARISON:  Chest radiograph from earlier today. 02/13/2018 CT abdomen/pelvis. FINDINGS: CTA CHEST FINDINGS Cardiovascular: The study is high quality for the evaluation of pulmonary embolism. Acute nonocclusive pulmonary embolus in the lobar branch of the right middle lobe  (series 6/image 143). No additional pulmonary emboli. No saddle embolus. Atherosclerotic nonaneurysmal thoracic aorta. Dilated main pulmonary artery (3.5 cm diameter). Top-normal heart size. No significant pericardial fluid/thickening. Three-vessel coronary atherosclerosis. Top-normal RV/LV ratio 0.9. Mediastinum/Nodes: No discrete thyroid nodules. Unremarkable esophagus. No axillary or mediastinal adenopathy. Mild right  hilar adenopathy up to 1.1 cm (series 6/image 122). No left hilar adenopathy. Lungs/Pleura: No pneumothorax. No pleural effusion. Mild centrilobular emphysema with mild diffuse bronchial wall thickening. No acute consolidative airspace disease or lung masses. Solid 3 mm left upper lobe pulmonary nodule (series 5/image 30). No additional significant pulmonary nodules. Musculoskeletal: No aggressive appearing focal osseous lesions. Moderate thoracic spondylosis. Review of the MIP images confirms the above findings. CT ABDOMEN and PELVIS FINDINGS Hepatobiliary: Normal liver with no liver mass. Cholecystectomy. Bile ducts are within normal post cholecystectomy limits with CBD diameter 6 mm. Pancreas: Normal, with no mass or duct dilation. Spleen: Normal size. No mass. Adrenals/Urinary Tract: Normal adrenals. No hydronephrosis. Hypodense exophytic 1.4 cm posterior lower left renal cortical lesion (series 5/image 24), slightly decreased from 1.6 cm on 02/13/2018 CT. Simple 1.2 cm upper right renal cyst. Numerous subcentimeter hypodense renal cortical lesions scattered in both kidneys are too small to characterize. Normal bladder. Stomach/Bowel: Normal non-distended stomach. Normal caliber small bowel with no small bowel wall thickening. Appendix is stable and within normal limits. Scattered fluid levels in the relatively collapsed large bowel with no large bowel wall thickening, significant diverticulosis or significant pericolonic fat stranding. Vascular/Lymphatic: Atherosclerotic nonaneurysmal abdominal  aorta. Patent portal, splenic, hepatic and renal veins. No pathologically enlarged lymph nodes in the abdomen or pelvis. Reproductive: Status post hysterectomy, with no abnormal findings at the vaginal cuff. No adnexal mass. Other: No pneumoperitoneum, ascites or focal fluid collection. Small fat containing umbilical hernia is unchanged. Musculoskeletal: No aggressive appearing focal osseous lesions. Marked lower lumbar spondylosis. Review of the MIP images confirms the above findings. IMPRESSION: 1. Acute nonocclusive pulmonary embolus in the lobar branch of the right middle lobe. No central pulmonary emboli. 2. Dilated main pulmonary artery, suggesting pulmonary arterial hypertension. 3. Nonspecific mild right hilar adenopathy. Suggest attention on follow-up chest CT with IV contrast in 3 months given smoking related changes in the lungs. 4. Scattered fluid levels in the relatively collapsed large bowel, suggesting a nonspecific malabsorptive / diarrheal state. No evidence of bowel obstruction or acute bowel inflammation. 5. Indeterminate exophytic 1.4 cm posterior lower left renal cortical lesion, slightly decreased in size since 02/13/2018 CT. Outpatient MRI or CT abdomen without and with IV contrast recommended for further characterization when clinically feasible. 6. Aortic Atherosclerosis (ICD10-I70.0) and Emphysema (ICD10-J43.9). Critical Value/emergent results were called by telephone at the time of interpretation on 01/25/2021 at 8:37 pm to provider [facility] , who verbally acknowledged these results. Electronically Signed   By: Delbert Phenix M.D.   On: 01/25/2021 20:38   CT ABDOMEN PELVIS W CONTRAST  Result Date: 01/25/2021 CLINICAL DATA:  Right-sided weakness since 07:30 today. Concern for PE. Abdominal pain. Prior hysterectomy and cholecystectomy. EXAM: CT ANGIOGRAPHY CHEST CT ABDOMEN AND PELVIS WITH CONTRAST TECHNIQUE: Multidetector CT imaging of the chest was performed using the standard  protocol during bolus administration of intravenous contrast. Multiplanar CT image reconstructions and MIPs were obtained to evaluate the vascular anatomy. Multidetector CT imaging of the abdomen and pelvis was performed using the standard protocol during bolus administration of intravenous contrast. CONTRAST:  75mL OMNIPAQUE IOHEXOL 350 MG/ML SOLN COMPARISON:  Chest radiograph from earlier today. 02/13/2018 CT abdomen/pelvis. FINDINGS: CTA CHEST FINDINGS Cardiovascular: The study is high quality for the evaluation of pulmonary embolism. Acute nonocclusive pulmonary embolus in the lobar branch of the right middle lobe (series 6/image 143). No additional pulmonary emboli. No saddle embolus. Atherosclerotic nonaneurysmal thoracic aorta. Dilated main pulmonary artery (3.5 cm diameter). Top-normal heart  size. No significant pericardial fluid/thickening. Three-vessel coronary atherosclerosis. Top-normal RV/LV ratio 0.9. Mediastinum/Nodes: No discrete thyroid nodules. Unremarkable esophagus. No axillary or mediastinal adenopathy. Mild right hilar adenopathy up to 1.1 cm (series 6/image 122). No left hilar adenopathy. Lungs/Pleura: No pneumothorax. No pleural effusion. Mild centrilobular emphysema with mild diffuse bronchial wall thickening. No acute consolidative airspace disease or lung masses. Solid 3 mm left upper lobe pulmonary nodule (series 5/image 30). No additional significant pulmonary nodules. Musculoskeletal: No aggressive appearing focal osseous lesions. Moderate thoracic spondylosis. Review of the MIP images confirms the above findings. CT ABDOMEN and PELVIS FINDINGS Hepatobiliary: Normal liver with no liver mass. Cholecystectomy. Bile ducts are within normal post cholecystectomy limits with CBD diameter 6 mm. Pancreas: Normal, with no mass or duct dilation. Spleen: Normal size. No mass. Adrenals/Urinary Tract: Normal adrenals. No hydronephrosis. Hypodense exophytic 1.4 cm posterior lower left renal cortical  lesion (series 5/image 24), slightly decreased from 1.6 cm on 02/13/2018 CT. Simple 1.2 cm upper right renal cyst. Numerous subcentimeter hypodense renal cortical lesions scattered in both kidneys are too small to characterize. Normal bladder. Stomach/Bowel: Normal non-distended stomach. Normal caliber small bowel with no small bowel wall thickening. Appendix is stable and within normal limits. Scattered fluid levels in the relatively collapsed large bowel with no large bowel wall thickening, significant diverticulosis or significant pericolonic fat stranding. Vascular/Lymphatic: Atherosclerotic nonaneurysmal abdominal aorta. Patent portal, splenic, hepatic and renal veins. No pathologically enlarged lymph nodes in the abdomen or pelvis. Reproductive: Status post hysterectomy, with no abnormal findings at the vaginal cuff. No adnexal mass. Other: No pneumoperitoneum, ascites or focal fluid collection. Small fat containing umbilical hernia is unchanged. Musculoskeletal: No aggressive appearing focal osseous lesions. Marked lower lumbar spondylosis. Review of the MIP images confirms the above findings. IMPRESSION: 1. Acute nonocclusive pulmonary embolus in the lobar branch of the right middle lobe. No central pulmonary emboli. 2. Dilated main pulmonary artery, suggesting pulmonary arterial hypertension. 3. Nonspecific mild right hilar adenopathy. Suggest attention on follow-up chest CT with IV contrast in 3 months given smoking related changes in the lungs. 4. Scattered fluid levels in the relatively collapsed large bowel, suggesting a nonspecific malabsorptive / diarrheal state. No evidence of bowel obstruction or acute bowel inflammation. 5. Indeterminate exophytic 1.4 cm posterior lower left renal cortical lesion, slightly decreased in size since 02/13/2018 CT. Outpatient MRI or CT abdomen without and with IV contrast recommended for further characterization when clinically feasible. 6. Aortic Atherosclerosis  (ICD10-I70.0) and Emphysema (ICD10-J43.9). Critical Value/emergent results were called by telephone at the time of interpretation on 01/25/2021 at 8:37 pm to provider [facility] , who verbally acknowledged these results. Electronically Signed   By: Delbert Phenix M.D.   On: 01/25/2021 20:38   DG Chest Portable 1 View  Result Date: 01/25/2021 CLINICAL DATA:  Weakness. EXAM: PORTABLE CHEST 1 VIEW COMPARISON:  Chest x-ray 03/11/2013. FINDINGS: Patient is rotated to the right. Mediastinum and hilar structures normal. Heart size normal. Thoracic aorta is tortuous. No focal infiltrate. No pleural effusion or pneumothorax. No acute bony abnormality. Surgical clips right upper quadrant. Well-circumscribed calcification noted over the left axilla, most likely calcification in a benign lymph node. IMPRESSION: No acute cardiopulmonary disease. Electronically Signed   By: Maisie Fus  Register   On: 01/25/2021 16:56    Procedures Procedures   Medications Ordered in ED Medications  sodium chloride 0.9 % bolus 1,000 mL (0 mLs Intravenous Stopped 01/25/21 1756)  iohexol (OMNIPAQUE) 350 MG/ML injection 75 mL (75 mLs Intravenous Contrast Given 01/25/21  1956)    ED Course  I have reviewed the triage vital signs and the nursing notes.  Pertinent labs & imaging results that were available during my care of the patient were reviewed by me and considered in my medical decision making (see chart for details).    MDM Rules/Calculators/A&P                          75 year old female brought in by niece for concerns of generalized weakness, altered mental status.  Niece states that her aunt came over to the patient's house today at around 730 and patient was having trouble getting off the toilet.  He states she was slightly more confused and weak.  They state that this is not her normal baseline.  They do not know of any recent fevers, falls.  They do not think she is on blood thinners.  On initial arrival, she is  afebrile, is tachycardic and appears confused.  She fears diffusely weak with no actual focal neuro deficit but difficult obtaining exam secondary to cooperation.  We will plan for sepsis work-up given tachycardia as well as CT head for concerns of possible intracranial abnormality.  UA negative for any infectious etiology.  Lactic acid is normal.  LFTs are within normal limits.  Troponin is normal. BMP shows BUN 21, creatinine 1.14.  CBC shows no leukocytosis or anemia.   CT head shows no acute abnormality. CXR negative for any infectious etiology.   Patient persistently tachycardia even after fluids here in the ED.  Patient still unable to provide me an accurate history.  Will obtain imaging to ensure that there is no acute infectious or PE etiology that is causing her symptoms.  CTA shows a small PE in the lobar branch of the right middle lobe.  No acute infectious etiology.  CT abdomen pelvis shows no acute abnormality.  Discussed results with patient.  Patient still confused and unable to answer all my questions.  At this time, concern that she may have had an acute stroke.  I do not know what the potential cause of her PE was.  Question if she is having small ischemic strokes that is causing her confusion.  At this time, will want to defer blood thinner use until she is able to obtain an MRI to determine if she's had an acute event.  At this time, she is hemodynamically stable without any signs of hypoxia.  We will plan for admission.  Discussed with Dr. Ok Edwards (hosiptalist) who accepts patient for admission.   Portions of this note were generated with Scientist, clinical (histocompatibility and immunogenetics). Dictation errors may occur despite best attempts at proofreading.   Final  Clinical Impression(s) / ED Diagnoses Final diagnoses:  Generalized weakness  Altered mental status, unspecified altered mental status type  Single subsegmental pulmonary embolism without acute [facility])    Rx / DC  Orders ED Discharge Orders    None       Rosana Hoes 01/25/21 2338    Little, Ambrose Finland, MD 01/28/21 [phone]

## 2021-01-25 NOTE — ED Notes (Signed)
ED Provider at bedside. 

## 2021-01-25 NOTE — ED Notes (Signed)
Attempted to call Misty Stanley niece no answer and VM left

## 2021-01-26 ENCOUNTER — Observation Stay (HOSPITAL_BASED_OUTPATIENT_CLINIC_OR_DEPARTMENT_OTHER): Payer: Medicare Other

## 2021-01-26 ENCOUNTER — Encounter (HOSPITAL_COMMUNITY): Payer: Self-pay | Admitting: Family Medicine

## 2021-01-26 ENCOUNTER — Observation Stay (HOSPITAL_COMMUNITY): Payer: Medicare Other

## 2021-01-26 DIAGNOSIS — G9341 Metabolic encephalopathy: Secondary | ICD-10-CM | POA: Diagnosis not present

## 2021-01-26 DIAGNOSIS — I2699 Other pulmonary embolism without acute cor pulmonale: Secondary | ICD-10-CM | POA: Diagnosis not present

## 2021-01-26 DIAGNOSIS — J449 Chronic obstructive pulmonary disease, unspecified: Secondary | ICD-10-CM | POA: Diagnosis not present

## 2021-01-26 DIAGNOSIS — E119 Type 2 diabetes mellitus without complications: Secondary | ICD-10-CM | POA: Diagnosis not present

## 2021-01-26 DIAGNOSIS — J9601 Acute respiratory failure with hypoxia: Secondary | ICD-10-CM | POA: Diagnosis not present

## 2021-01-26 DIAGNOSIS — E440 Moderate protein-calorie malnutrition: Secondary | ICD-10-CM | POA: Diagnosis not present

## 2021-01-26 DIAGNOSIS — Z20822 Contact with and (suspected) exposure to covid-19: Secondary | ICD-10-CM | POA: Diagnosis not present

## 2021-01-26 DIAGNOSIS — R5381 Other malaise: Secondary | ICD-10-CM | POA: Diagnosis not present

## 2021-01-26 DIAGNOSIS — I110 Hypertensive heart disease with heart failure: Secondary | ICD-10-CM | POA: Diagnosis not present

## 2021-01-26 DIAGNOSIS — R531 Weakness: Secondary | ICD-10-CM | POA: Diagnosis present

## 2021-01-26 DIAGNOSIS — N179 Acute kidney failure, unspecified: Secondary | ICD-10-CM | POA: Diagnosis not present

## 2021-01-26 DIAGNOSIS — E876 Hypokalemia: Secondary | ICD-10-CM | POA: Diagnosis not present

## 2021-01-26 DIAGNOSIS — Z681 Body mass index (BMI) 19 or less, adult: Secondary | ICD-10-CM | POA: Diagnosis not present

## 2021-01-26 DIAGNOSIS — F17200 Nicotine dependence, unspecified, uncomplicated: Secondary | ICD-10-CM | POA: Diagnosis not present

## 2021-01-26 DIAGNOSIS — I2693 Single subsegmental pulmonary embolism without acute cor pulmonale: Secondary | ICD-10-CM | POA: Diagnosis not present

## 2021-01-26 DIAGNOSIS — R000 Tachycardia, unspecified: Secondary | ICD-10-CM | POA: Diagnosis not present

## 2021-01-26 DIAGNOSIS — F32A Depression, unspecified: Secondary | ICD-10-CM | POA: Diagnosis not present

## 2021-01-26 DIAGNOSIS — F419 Anxiety disorder, unspecified: Secondary | ICD-10-CM | POA: Diagnosis not present

## 2021-01-26 DIAGNOSIS — E871 Hypo-osmolality and hyponatremia: Secondary | ICD-10-CM | POA: Diagnosis not present

## 2021-01-26 DIAGNOSIS — E039 Hypothyroidism, unspecified: Secondary | ICD-10-CM | POA: Diagnosis not present

## 2021-01-26 DIAGNOSIS — Z79899 Other long term (current) drug therapy: Secondary | ICD-10-CM | POA: Diagnosis not present

## 2021-01-26 DIAGNOSIS — I5032 Chronic diastolic (congestive) heart failure: Secondary | ICD-10-CM | POA: Diagnosis not present

## 2021-01-26 DIAGNOSIS — Z9049 Acquired absence of other specified parts of digestive tract: Secondary | ICD-10-CM | POA: Diagnosis not present

## 2021-01-26 DIAGNOSIS — E785 Hyperlipidemia, unspecified: Secondary | ICD-10-CM | POA: Diagnosis not present

## 2021-01-26 DIAGNOSIS — R4701 Aphasia: Secondary | ICD-10-CM | POA: Diagnosis not present

## 2021-01-26 LAB — COMPREHENSIVE METABOLIC PANEL
ALT: 15 U/L (ref 0–44)
AST: 17 U/L (ref 15–41)
Albumin: 2.9 g/dL — ABNORMAL LOW (ref 3.5–5.0)
Alkaline Phosphatase: 53 U/L (ref 38–126)
Anion gap: 12 (ref 5–15)
BUN: 16 mg/dL (ref 8–23)
CO2: 25 mmol/L (ref 22–32)
Calcium: 8.5 mg/dL — ABNORMAL LOW (ref 8.9–10.3)
Chloride: 97 mmol/L — ABNORMAL LOW (ref 98–111)
Creatinine, Ser: 1.02 mg/dL — ABNORMAL HIGH (ref 0.44–1.00)
GFR, Estimated: 57 mL/min — ABNORMAL LOW (ref >60)
Glucose, Bld: 89 mg/dL (ref 70–99)
Potassium: 3.5 mmol/L (ref 3.5–5.1)
Sodium: 134 mmol/L — ABNORMAL LOW (ref 135–145)
Total Bilirubin: 1.2 mg/dL (ref 0.3–1.2)
Total Protein: 7.4 g/dL (ref 6.5–8.1)

## 2021-01-26 LAB — PHOSPHORUS: Phosphorus: 3.2 mg/dL (ref 2.5–4.6)

## 2021-01-26 LAB — MAGNESIUM: Magnesium: 1.6 mg/dL — ABNORMAL LOW (ref 1.7–2.4)

## 2021-01-26 LAB — CBC WITH DIFFERENTIAL/PLATELET
Abs Immature Granulocytes: 0.06 10*3/uL (ref 0.00–0.07)
Basophils Absolute: 0.1 10*3/uL (ref 0.0–0.1)
Basophils Relative: 1 %
Eosinophils Absolute: 0 10*3/uL (ref 0.0–0.5)
Eosinophils Relative: 0 %
HCT: 39 % (ref 36.0–46.0)
Hemoglobin: 12.6 g/dL (ref 12.0–15.0)
Immature Granulocytes: 1 %
Lymphocytes Relative: 29 %
Lymphs Abs: 2.2 10*3/uL (ref 0.7–4.0)
MCH: 29.7 pg (ref 26.0–34.0)
MCHC: 32.3 g/dL (ref 30.0–36.0)
MCV: 92 fL (ref 80.0–100.0)
Monocytes Absolute: 0.6 10*3/uL (ref 0.1–1.0)
Monocytes Relative: 8 %
Neutro Abs: 4.6 10*3/uL (ref 1.7–7.7)
Neutrophils Relative %: 61 %
Platelets: 232 10*3/uL (ref 150–400)
RBC: 4.24 MIL/uL (ref 3.87–5.11)
RDW: 18 % — ABNORMAL HIGH (ref 11.5–15.5)
WBC: 7.5 10*3/uL (ref 4.0–10.5)
nRBC: 0 % (ref 0.0–0.2)

## 2021-01-26 LAB — GLUCOSE, CAPILLARY
Glucose-Capillary: 91 mg/dL (ref 70–99)
Glucose-Capillary: 102 mg/dL — ABNORMAL HIGH (ref 70–99)
Glucose-Capillary: 89 mg/dL (ref 70–99)

## 2021-01-26 LAB — CBG MONITORING, ED: Glucose-Capillary: 89 mg/dL (ref 70–99)

## 2021-01-26 LAB — TSH: TSH: 2.968 u[IU]/mL (ref 0.350–4.500)

## 2021-01-26 LAB — HEMOGLOBIN A1C
Hgb A1c MFr Bld: 7.7 % — ABNORMAL HIGH (ref 4.8–5.6)
Mean Plasma Glucose: 174.29 mg/dL

## 2021-01-26 LAB — LACTIC ACID, PLASMA: Lactic Acid, Venous: 0.8 mmol/L (ref 0.5–1.9)

## 2021-01-26 LAB — AMMONIA: Ammonia: 13 umol/L (ref 9–35)

## 2021-01-26 MED ORDER — ACETAMINOPHEN 650 MG RE SUPP: 650.0000 mg | RECTAL | Status: DC | PRN

## 2021-01-26 MED ORDER — ACETAMINOPHEN 160 MG/5ML PO SOLN: 650.0000 mg | ORAL | Status: DC | PRN

## 2021-01-26 MED ORDER — INSULIN ASPART 100 UNIT/ML ~~LOC~~ SOLN
0.0000 [IU] | Freq: Three times a day (TID) | SUBCUTANEOUS | Status: DC
  Administered 2021-01-29 – 2021-01-30 (×2): 1 [IU] via SUBCUTANEOUS

## 2021-01-26 MED ORDER — SODIUM CHLORIDE 0.9 % IV SOLN: Freq: Once | INTRAVENOUS | Status: AC

## 2021-01-26 MED ORDER — HEPARIN (PORCINE) 25000 UT/250ML-% IV SOLN
800.0000 [IU]/h | INTRAVENOUS | Status: DC
  Administered 2021-01-26: 800 [IU]/h via INTRAVENOUS
  Filled 2021-01-26: qty 250

## 2021-01-26 MED ORDER — ACETAMINOPHEN 325 MG PO TABS
650.0000 mg | ORAL_TABLET | ORAL | Status: DC | PRN
  Administered 2021-01-26 – 2021-01-30 (×4): 650 mg via ORAL
  Filled 2021-01-26 (×4): qty 2

## 2021-01-26 MED ORDER — STROKE: EARLY STAGES OF RECOVERY BOOK: Freq: Once | Status: DC

## 2021-01-26 MED ORDER — HEPARIN BOLUS VIA INFUSION
1500.0000 [IU] | Freq: Once | INTRAVENOUS | Status: AC
  Administered 2021-01-26: 1500 [IU] via INTRAVENOUS
  Filled 2021-01-26: qty 1500

## 2021-01-26 NOTE — Progress Notes (Addendum)
Shift Summary:   Patient remained alert and oriented with periods of confusion. Needed assistance with feeding, HR remained tachy in the upper 120-130's during this shift. Otherwise vital signs where stable during this shift. Patient is continent and aware of when she has to stool/pee.  Doppler ultrasound of bilateral legs completed . MRI completed during this shift.  CT done at previous hospital. Steward Drone [place] cousin, live in [place], endorsed she will be the in person  point of contact for patient, but cousin [name] will the one that decides on the plane of care for patient during hospital stay.  She also endorsed patient fell 1x at [facility] this week and also fell at home this week x1. Heparin gtt initiate per pharmacy orders and MAR. Heparin recheck due 0030. This nurse was under the impression per the patient that the point of contact for patient would be Ozella Rocks whom is patient adopted daughter, per Steward Drone, "Lanora Manis is not to be  Contacted unless someone dies." No other needs identified. Will continue to monitor.

## 2021-01-26 NOTE — Progress Notes (Signed)
ANTICOAGULATION CONSULT NOTE - Initial Consult  Pharmacy Consult for IV heparin Indication: pulmonary embolus  Allergies  Allergen Reactions  . Ace Inhibitors Anaphylaxis and Swelling    Possible angioedema/anaphylaxis. Tongue swelling, throat closing sensation, shortness of breath. Admission to hospital on 06/03/2020   . Penicillins Hives, Itching and Rash  . Iron Diarrhea  . Rofecoxib Diarrhea and Other (See Comments)  . Nickel Rash    Patient Measurements: Height: 5\' 3"  (160 cm) Weight: 46.1 kg (101 lb 10.1 oz) IBW/kg (Calculated) : 52.4 Heparin Dosing Weight: 46 kg  Vital Signs: Temp: 98.5 F (36.9 C) (02/17 1332) Temp Source: Axillary (02/17 1332) BP: 129/90 (02/17 1600) Pulse Rate: 130 (02/17 1600)  Labs: Recent Labs    01/25/21 1537 01/26/21 1217  HGB 12.5 12.6  HCT 37.7 39.0  PLT 270 232  CREATININE 1.14* 1.02*  TROPONINIHS 9  --     Estimated Creatinine Clearance: 34.7 mL/min (A) (by C-G formula based on SCr of 1.02 mg/dL (H)).   Medical History: Past Medical History:  Diagnosis Date  . Bronchitis   . COPD (chronic obstructive pulmonary disease) (HCC)   . Diabetes mellitus without complication (HCC)   . Hypertension   . Tachycardia     Medications:  Scheduled:  .  stroke: mapping our early stages of recovery book   Does not apply Once  . insulin aspart  0-6 Units Subcutaneous TID WC   Infusions:    Assessment: 75 yo female presented to ED with AMS with concern for possible TIA but MRI showed no acute changes. In addition, a new PE was found to now start IV heparin per Rx after CVA was ruled out. Baseline labs have been drawn.  Goal of Therapy:  Heparin level 0.3-0.7 units/ml Monitor platelets by anticoagulation protocol: Yes   Plan:   IV heparin 1500 unit bolus then  IV heparin rate of 800 units/hr  Check heparin level 8 hours after start of IV heparin  Daily CBC and heparin level   61 01/26/2021,5:49 PM

## 2021-01-26 NOTE — H&P (Addendum)
History and Physical    Joyce Palmer:[id number] DOB: 10-Dec-1946 DOA: 01/25/2021  PCP: Drucie Opitz, MD  Patient coming from: [facility]  Chief Complaint: AMS  HPI: Joyce Palmer is a 75 y.o. female with medical history significant of DM2, hyponatremia, HTN, HFpEF. Presenting with altered mental status. History is from chart review and conversation with niece. Patient is a poor historian. Her niece reports that the patient was in the bathroom this morning and had difficulty getting up. When she was assisted to standing, she seemed very weak and on the verge of falling as her balance was off. Other family members had reported to the niece that she had some falls. The niece also states that the patient seemed confused at some moments and clear the next. They became concerned and brought her to the ED.   ED Course: Patient was found to be confused and only oriented to name, place. CTH was negative. CTA PE was positive for PE. She was not started on anticoagulation d/t ongoing concern for possible stroke. TRH was called for admission.   Review of Systems:  Unable to obtained due to mentation.    PMHx Past Medical History:  Diagnosis Date  . Bronchitis   . COPD (chronic obstructive pulmonary disease) (HCC)   . Diabetes mellitus without complication (HCC)   . Hypertension   . Tachycardia     PSHx Past Surgical History:  Procedure Laterality Date  . ABDOMINAL HYSTERECTOMY    . CHOLECYSTECTOMY    . spleenectomy      SocHx  reports that she has been smoking. She has never used smokeless tobacco. She reports that she does not drink alcohol and does not use drugs.  Allergies  Allergen Reactions  . Ace Inhibitors Anaphylaxis and Swelling    Possible angioedema/anaphylaxis. Tongue swelling, throat closing sensation, shortness of breath. Admission to hospital on 06/03/2020   . Penicillins Hives, Itching and Rash  . Iron Diarrhea  . Rofecoxib Diarrhea and Other (See Comments)  . Nickel  Rash    FamHx No family history on file.  Prior to Admission medications   Medication Sig Start Date End Date Taking? Authorizing Provider  calcium carbonate (OS-CAL) 1250 (500 Ca) MG chewable tablet Chew by mouth. 01/23/21  Yes [provider]  alendronate (FOSAMAX) 70 MG tablet Take by mouth.    [provider]  allopurinol (ZYLOPRIM) 100 MG tablet Take by mouth.    [provider]  amitriptyline (ELAVIL) 50 MG tablet Take 50 mg by mouth at bedtime.    [provider]  aspirin 325 MG tablet Take 325 mg by mouth daily.    [provider]  ATENOLOL PO Take by mouth.    [provider]  Azelastine-Fluticasone 137-50 MCG/ACT SUSP Place into the nose.    [provider]  clotrimazole-betamethasone (LOTRISONE) cream Apply topically.    [provider]  furosemide (LASIX) 20 MG tablet Take 1 tablet (20 mg total) by mouth 2 (two) times daily. 03/11/13   Nelva Nay, MD  insulin glargine (LANTUS) 100 UNIT/ML injection Inject 10 Units into the skin every morning.    [provider]  levothyroxine (SYNTHROID) 50 MCG tablet Take by mouth.    [provider]  meloxicam (MOBIC) 15 MG tablet Take 15 mg by mouth daily.    [provider]  METFORMIN HCL PO Take 1,000 mg by mouth.    [provider]  naloxone [facility]) nasal spray 4 mg/0.1 mL Place into the  nose.    [provider]  oxyCODONE-acetaminophen (PERCOCET) 7.5-325 MG tablet Take 1 tablet by mouth every 4 (four) hours as needed for severe pain.    [provider]  pantoprazole (PROTONIX) 40 MG tablet Take 40 mg by mouth daily. 10/31/20   [provider]  pioglitazone (ACTOS) 30 MG tablet Take 30 mg by mouth daily.    [provider]  sertraline (ZOLOFT) 50 MG tablet Take 50 mg by mouth daily.    [provider]  tiZANidine (ZANAFLEX) 4 MG tablet Take 4 mg by mouth 3 (three) times daily as needed.  10/31/20   [provider]  Vitamin D, Ergocalciferol, (DRISDOL) 50000 units CAPS capsule Take 50,000 Units by mouth every 7 (seven) days.    [provider]    Physical Exam: Vitals:   01/26/21 0730 01/26/21 0800 01/26/21 0930 01/26/21 0957  BP: 129/80 125/81 127/78 122/73  Pulse: (!) 130 (!) 122 (!) 133 (!) 130  Resp: (!) 27 16 15 19   Temp:  99.3 F (37.4 C)    TempSrc:  Oral    SpO2: 98% 98% 99% 97%  Weight:        General: 75 y.o. female resting in bed in NAD Eyes: PERRL, normal sclera ENMT: Nares patent w/o discharge, orophaynx clear, dentition normal, ears w/o discharge/lesions/ulcers Neck: Supple, trachea midline Cardiovascular: tachy, +S1, S2, no m/g/r, equal pulses throughout Respiratory: CTABL, no w/r/r, normal WOB GI: BS+, NDNT, no masses noted, no organomegaly noted MSK: No e/c/c Skin: No rashes, bruises, ulcerations noted Neuro: A&O x name, year, decreased rV1, B/l UE 4-5/5, B/l LE 4/5 Psyc: Somewhat confused and flat affect, calm/cooperative  Labs on Admission: I have personally reviewed following labs and imaging studies  CBC: Recent Labs  Lab 01/25/21 1537  WBC 7.4  HGB 12.5  HCT 37.7  MCV 88.1  PLT 270   Basic Metabolic Panel: Recent Labs  Lab 01/25/21 1537  NA 131*  K 4.3  CL 95*  CO2 25  GLUCOSE 139*  BUN 21  CREATININE 1.14*  CALCIUM 8.7*   GFR: CrCl cannot be calculated (Unknown ideal weight.). Liver Function Tests: Recent Labs  Lab 01/25/21 1537  AST 20  ALT 13  ALKPHOS 50  BILITOT 1.1  PROT 7.9  ALBUMIN 3.2*   No results for input(s): LIPASE, AMYLASE in the last 168 hours. No results for input(s): AMMONIA in the last 168 hours. Coagulation Profile: No results for input(s): INR, PROTIME in the last 168 hours. Cardiac Enzymes: No results for input(s): CKTOTAL, CKMB, CKMBINDEX, TROPONINI in the last 168 hours. BNP (last 3 results) No results for input(s): PROBNP in the last 8760 hours. HbA1C: No results  for input(s): HGBA1C in the last 72 hours. CBG: Recent Labs  Lab 01/25/21 1525 01/26/21 1006  GLUCAP 135* 89   Lipid Profile: No results for input(s): CHOL, HDL, LDLCALC, TRIG, CHOLHDL, LDLDIRECT in the last 72 hours. Thyroid Function Tests: Recent Labs    01/25/21 2211  TSH 2.968   Anemia Panel: No results for input(s): VITAMINB12, FOLATE, FERRITIN, TIBC, IRON, RETICCTPCT in the last 72 hours. Urine analysis:    Component Value Date/Time   COLORURINE YELLOW 01/25/2021 1630   APPEARANCEUR CLEAR 01/25/2021 1630   LABSPEC 1.005 01/25/2021 1630   PHURINE 7.5 01/25/2021 1630   GLUCOSEU NEGATIVE 01/25/2021 1630   HGBUR NEGATIVE 01/25/2021 1630   BILIRUBINUR NEGATIVE 01/25/2021 1630   KETONESUR NEGATIVE 01/25/2021 1630   PROTEINUR NEGATIVE 01/25/2021 1630   NITRITE  NEGATIVE 01/25/2021 1630   LEUKOCYTESUR NEGATIVE 01/25/2021 1630    Radiological Exams on Admission: CT Head Wo Contrast  Result Date: 01/25/2021 CLINICAL DATA:  Mental status change, unknown cause. Additional history provided: Right-sided weakness since 07:30 today. EXAM: CT HEAD WITHOUT CONTRAST TECHNIQUE: Contiguous axial images were obtained from the base of the skull through the vertex without intravenous contrast. COMPARISON:  No pertinent prior exams available for comparison. FINDINGS: Brain: Moderate cerebral atrophy. Associated prominence of the ventricles and sulci. Comparatively mild cerebellar atrophy. Moderate ill-defined hypoattenuation within the cerebral white matter is nonspecific, but compatible with chronic small vessel ischemic disease. There is no acute intracranial hemorrhage. No demarcated cortical infarct. No extra-axial fluid collection. No evidence of intracranial mass. No midline shift. Vascular: No hyperdense vessel. Atherosclerotic calcifications. Skull: Normal. Negative for fracture or focal lesion. Sinuses/Orbits: Visualized orbits show no acute finding. Complete opacification of the partially  imaged right maxillary sinus. Extensive partial opacification of the partially imaged left maxillary sinus. Moderate ethmoid sinus mucosal thickening. IMPRESSION: No CT evidence of acute intracranial abnormality. Moderate cerebral atrophy and chronic small vessel ischemic disease. Comparatively mild cerebellar atrophy. Bilateral ethmoid and maxillary paranasal sinus disease at the imaged levels, as described. Electronically Signed   By: Jackey Loge DO   On: 01/25/2021 16:55   CT Angio Chest PE W and/or Wo Contrast  Result Date: 01/25/2021 CLINICAL DATA:  Right-sided weakness since 07:30 today. Concern for PE. Abdominal pain. Prior hysterectomy and cholecystectomy. EXAM: CT ANGIOGRAPHY CHEST CT ABDOMEN AND PELVIS WITH CONTRAST TECHNIQUE: Multidetector CT imaging of the chest was performed using the standard protocol during bolus administration of intravenous contrast. Multiplanar CT image reconstructions and MIPs were obtained to evaluate the vascular anatomy. Multidetector CT imaging of the abdomen and pelvis was performed using the standard protocol during bolus administration of intravenous contrast. CONTRAST:  75mL OMNIPAQUE IOHEXOL 350 MG/ML SOLN COMPARISON:  Chest radiograph from earlier today. 02/13/2018 CT abdomen/pelvis. FINDINGS: CTA CHEST FINDINGS Cardiovascular: The study is high quality for the evaluation of pulmonary embolism. Acute nonocclusive pulmonary embolus in the lobar branch of the right middle lobe (series 6/image 143). No additional pulmonary emboli. No saddle embolus. Atherosclerotic nonaneurysmal thoracic aorta. Dilated main pulmonary artery (3.5 cm diameter). Top-normal heart size. No significant pericardial fluid/thickening. Three-vessel coronary atherosclerosis. Top-normal RV/LV ratio 0.9. Mediastinum/Nodes: No discrete thyroid nodules. Unremarkable esophagus. No axillary or mediastinal adenopathy. Mild right hilar adenopathy up to 1.1 cm (series 6/image 122). No left hilar  adenopathy. Lungs/Pleura: No pneumothorax. No pleural effusion. Mild centrilobular emphysema with mild diffuse bronchial wall thickening. No acute consolidative airspace disease or lung masses. Solid 3 mm left upper lobe pulmonary nodule (series 5/image 30). No additional significant pulmonary nodules. Musculoskeletal: No aggressive appearing focal osseous lesions. Moderate thoracic spondylosis. Review of the MIP images confirms the above findings. CT ABDOMEN and PELVIS FINDINGS Hepatobiliary: Normal liver with no liver mass. Cholecystectomy. Bile ducts are within normal post cholecystectomy limits with CBD diameter 6 mm. Pancreas: Normal, with no mass or duct dilation. Spleen: Normal size. No mass. Adrenals/Urinary Tract: Normal adrenals. No hydronephrosis. Hypodense exophytic 1.4 cm posterior lower left renal cortical lesion (series 5/image 24), slightly decreased from 1.6 cm on 02/13/2018 CT. Simple 1.2 cm upper right renal cyst. Numerous subcentimeter hypodense renal cortical lesions scattered in both kidneys are too small to characterize. Normal bladder. Stomach/Bowel: Normal non-distended stomach. Normal caliber small bowel with no small bowel wall thickening. Appendix is stable and within normal limits. Scattered fluid levels in the relatively  collapsed large bowel with no large bowel wall thickening, significant diverticulosis or significant pericolonic fat stranding. Vascular/Lymphatic: Atherosclerotic nonaneurysmal abdominal aorta. Patent portal, splenic, hepatic and renal veins. No pathologically enlarged lymph nodes in the abdomen or pelvis. Reproductive: Status post hysterectomy, with no abnormal findings at the vaginal cuff. No adnexal mass. Other: No pneumoperitoneum, ascites or focal fluid collection. Small fat containing umbilical hernia is unchanged. Musculoskeletal: No aggressive appearing focal osseous lesions. Marked lower lumbar spondylosis. Review of the MIP images confirms the above findings.  IMPRESSION: 1. Acute nonocclusive pulmonary embolus in the lobar branch of the right middle lobe. No central pulmonary emboli. 2. Dilated main pulmonary artery, suggesting pulmonary arterial hypertension. 3. Nonspecific mild right hilar adenopathy. Suggest attention on follow-up chest CT with IV contrast in 3 months given smoking related changes in the lungs. 4. Scattered fluid levels in the relatively collapsed large bowel, suggesting a nonspecific malabsorptive / diarrheal state. No evidence of bowel obstruction or acute bowel inflammation. 5. Indeterminate exophytic 1.4 cm posterior lower left renal cortical lesion, slightly decreased in size since 02/13/2018 CT. Outpatient MRI or CT abdomen without and with IV contrast recommended for further characterization when clinically feasible. 6. Aortic Atherosclerosis (ICD10-I70.0) and Emphysema (ICD10-J43.9). Critical Value/emergent results were called by telephone at the time of interpretation on 01/25/2021 at 8:37 pm to provider [facility] , who verbally acknowledged these results. Electronically Signed   By: Delbert Phenix M.D.   On: 01/25/2021 20:38   CT ABDOMEN PELVIS W CONTRAST  Result Date: 01/25/2021 CLINICAL DATA:  Right-sided weakness since 07:30 today. Concern for PE. Abdominal pain. Prior hysterectomy and cholecystectomy. EXAM: CT ANGIOGRAPHY CHEST CT ABDOMEN AND PELVIS WITH CONTRAST TECHNIQUE: Multidetector CT imaging of the chest was performed using the standard protocol during bolus administration of intravenous contrast. Multiplanar CT image reconstructions and MIPs were obtained to evaluate the vascular anatomy. Multidetector CT imaging of the abdomen and pelvis was performed using the standard protocol during bolus administration of intravenous contrast. CONTRAST:  71mL OMNIPAQUE IOHEXOL 350 MG/ML SOLN COMPARISON:  Chest radiograph from earlier today. 02/13/2018 CT abdomen/pelvis. FINDINGS: CTA CHEST FINDINGS Cardiovascular: The study is high  quality for the evaluation of pulmonary embolism. Acute nonocclusive pulmonary embolus in the lobar branch of the right middle lobe (series 6/image 143). No additional pulmonary emboli. No saddle embolus. Atherosclerotic nonaneurysmal thoracic aorta. Dilated main pulmonary artery (3.5 cm diameter). Top-normal heart size. No significant pericardial fluid/thickening. Three-vessel coronary atherosclerosis. Top-normal RV/LV ratio 0.9. Mediastinum/Nodes: No discrete thyroid nodules. Unremarkable esophagus. No axillary or mediastinal adenopathy. Mild right hilar adenopathy up to 1.1 cm (series 6/image 122). No left hilar adenopathy. Lungs/Pleura: No pneumothorax. No pleural effusion. Mild centrilobular emphysema with mild diffuse bronchial wall thickening. No acute consolidative airspace disease or lung masses. Solid 3 mm left upper lobe pulmonary nodule (series 5/image 30). No additional significant pulmonary nodules. Musculoskeletal: No aggressive appearing focal osseous lesions. Moderate thoracic spondylosis. Review of the MIP images confirms the above findings. CT ABDOMEN and PELVIS FINDINGS Hepatobiliary: Normal liver with no liver mass. Cholecystectomy. Bile ducts are within normal post cholecystectomy limits with CBD diameter 6 mm. Pancreas: Normal, with no mass or duct dilation. Spleen: Normal size. No mass. Adrenals/Urinary Tract: Normal adrenals. No hydronephrosis. Hypodense exophytic 1.4 cm posterior lower left renal cortical lesion (series 5/image 24), slightly decreased from 1.6 cm on 02/13/2018 CT. Simple 1.2 cm upper right renal cyst. Numerous subcentimeter hypodense renal cortical lesions scattered in both kidneys are too small to characterize. Normal bladder. Stomach/Bowel:  Normal non-distended stomach. Normal caliber small bowel with no small bowel wall thickening. Appendix is stable and within normal limits. Scattered fluid levels in the relatively collapsed large bowel with no large bowel wall  thickening, significant diverticulosis or significant pericolonic fat stranding. Vascular/Lymphatic: Atherosclerotic nonaneurysmal abdominal aorta. Patent portal, splenic, hepatic and renal veins. No pathologically enlarged lymph nodes in the abdomen or pelvis. Reproductive: Status post hysterectomy, with no abnormal findings at the vaginal cuff. No adnexal mass. Other: No pneumoperitoneum, ascites or focal fluid collection. Small fat containing umbilical hernia is unchanged. Musculoskeletal: No aggressive appearing focal osseous lesions. Marked lower lumbar spondylosis. Review of the MIP images confirms the above findings. IMPRESSION: 1. Acute nonocclusive pulmonary embolus in the lobar branch of the right middle lobe. No central pulmonary emboli. 2. Dilated main pulmonary artery, suggesting pulmonary arterial hypertension. 3. Nonspecific mild right hilar adenopathy. Suggest attention on follow-up chest CT with IV contrast in 3 months given smoking related changes in the lungs. 4. Scattered fluid levels in the relatively collapsed large bowel, suggesting a nonspecific malabsorptive / diarrheal state. No evidence of bowel obstruction or acute bowel inflammation. 5. Indeterminate exophytic 1.4 cm posterior lower left renal cortical lesion, slightly decreased in size since 02/13/2018 CT. Outpatient MRI or CT abdomen without and with IV contrast recommended for further characterization when clinically feasible. 6. Aortic Atherosclerosis (ICD10-I70.0) and Emphysema (ICD10-J43.9). Critical Value/emergent results were called by telephone at the time of interpretation on 01/25/2021 at 8:37 pm to [facility] , who verbally acknowledged these results. Electronically Signed   By: Delbert PhenixJason A Poff M.D.   On: 01/25/2021 20:38   DG Chest Portable 1 View  Result Date: 01/25/2021 CLINICAL DATA:  Weakness. EXAM: PORTABLE CHEST 1 VIEW COMPARISON:  Chest x-ray 03/11/2013. FINDINGS: Patient is rotated to the right.  Mediastinum and hilar structures normal. Heart size normal. Thoracic aorta is tortuous. No focal infiltrate. No pleural effusion or pneumothorax. No acute bony abnormality. Surgical clips right upper quadrant. Well-circumscribed calcification noted over the left axilla, most likely calcification in a benign lymph node. IMPRESSION: No acute cardiopulmonary disease. Electronically Signed   By: Maisie Fushomas  Register   On: 01/25/2021 16:56    EKG: Independently reviewed. Sinus tach, no st elevation  Assessment/Plan Acute metabolic encephalopathy Weakness Falls     - admit to obs, tele     - concern for possible TIA; CTH was negative, but during EDP eval she had focal findings; she currently has b/l UE/LE weakness with right V1 decreased sensation; check MRI brain, consult neurology     - PT/OT/SLP     - check lipid panel     - last A1c was 7.4 on 01/17/21     - bedside nursing swallow screen  Pulmonary Embolism     - hold on starting anticoagulation until MRI complete     - respiratory status is ok     - check BLE venous dopplers  Chronic Hyponatremia     - mild, stable, follow  DM2     - last A1c on 01/17/21 was 7.4     - SSI, glucose checks  Hx of HFpEF     - watch fluids status     - need med rec completed, she is unable to tell us her meds  AKI     - will get gentle hydration  Moderate protein calorie malnutrition     - dietary consult  DVT prophylaxis: TED  Code Status: FULL  Family Communication: Spoke with niece,  [name], by phone  Consults called: Paged neurology, awaiting call back   Status is: Observation  The patient remains OBS appropriate and will d/c before 2 midnights.  Dispo: The patient is from: Home              Anticipated d/c is to: TBD              Anticipated d/c date is: 1 day              Patient currently is not medically stable to d/c.   Difficult to place patient No  Teddy Spike DO [facility]  If 7PM-7AM, please contact  night-coverage [email or web address]  01/26/2021, 10:50 AM

## 2021-01-26 NOTE — CV Procedure (Signed)
BLE venous duplex completed  Results can be found under chart review under CV PROC. 01/26/2021 3:34 PM Emmerson Shuffield RVT, RDMS

## 2021-01-26 NOTE — ED Notes (Signed)
Spoke to pharmacist at [facility].  Pt needs to have negative MRI before Eliquis can be started

## 2021-01-26 NOTE — Plan of Care (Signed)

## 2021-01-26 NOTE — Evaluation (Signed)
SLP Cancellation Note  Patient Details Name: Joyce Palmer MRN: [id number] DOB: 03-15-1946   Cancelled treatment:       Reason Eval/Treat Not Completed: Other (comment) (pt having vascular testing done momentarily and then for MRI *MRI staff arrived to take pt for testing, Will continue efforts.)  Rolena Infante, MS [facility] SLP Acute Rehab Services Office [phone] Pager [phone]   Chales Abrahams 01/26/2021, 2:34 PM

## 2021-01-26 NOTE — ED Notes (Signed)
Informed Misty Stanley (niece) of patient transfer to [facility]. Steward Drone (cousin) called as well, left voicemail.

## 2021-01-26 NOTE — ED Notes (Signed)
Report given to [facility]. 

## 2021-01-26 NOTE — ED Notes (Signed)
Darnell Level (cousin) states pt lives with her brother, who she cares for. Arrangements will need to be made for both of them for placement if she is unable to care for him.

## 2021-01-26 NOTE — Progress Notes (Addendum)
   01/26/21 1104  Vitals  BP 112/79  MAP (mmHg) 91  BP Location Right Arm  BP Method Automatic  Patient Position (if appropriate) Lying  Pulse Rate (!) 128  ECG Heart Rate (!) 129  Resp 20  Level of Consciousness  Level of Consciousness Alert  MEWS COLOR  MEWS Score Color Yellow  Oxygen Therapy  SpO2 96 %  O2 Device Room Air  Arrived to 1428-W. Patient alert and oriented x 4. Expressive aphasia noted with speech. With bouts of confusion, when asked what happened yesterday, or why she was in the hospital patient was unable to answer. Patient was also unable to answer questions about med rec. Pupils 3+ and brisk bilaterally. Sensation intact all over, when asked if patient could lift up legs for NIHSS, patient endorses, "my legs are too weak today."   Drift noted to upper extremities and lower extremities.  Musculoskeletal RUE 4/5 LUE 4/5 LLE 3/5 LLE  3/5   Skin intact, but scattered hardened scars noted to bilateral legs, dry skin noted to bilateral speech. Sinus tach noted in the monitors in the upper 120s, asymptomatic at this time. Will continue to monitor patient throughout the shift.

## 2021-01-26 NOTE — Progress Notes (Signed)
Patient to MRI. Neurologically intact.

## 2021-01-26 NOTE — Consult Note (Addendum)
Neurology Consultation  Reason for Consult: Acute encephalopathy Referring Physician: Dr. Ronaldo Miyamoto  CC: Altered mental status  History is obtained from: Daughter via telephone, chart review  HPI: Joyce Palmer is a 75 y.o. female with a medical history significant for type 2 diabetes mellitus, hyponatremia, hypertension, hypothyroidism, severe pulmonary hypertension, heart failure with preserved ejection fraction, hypertension, chronic kidney disease stage III, and COPD on 325 mg home aspirin daily who was initially admitted to [facility] with generalized weakness, fatigue, abdominal pain, weight loss, and decreased appetite for 2 weeks. She was found to be hyponatremic, dehydrated, hypokalemic with hypomagnesemia and hypophosphatemia and with a mild acute kidney injury. She was admitted from 2/8 through 2/14 and discharged with discontinuation of her furosemide thought to contribute to her dehydration and electrolyte abnormalities. She then presented to [facility] ED on 2/16 for evaluation of weakness. Her niece had gone to check on her and found her to have difficulty getting up from the restroom and felt unsteady with standing. She complained of chest pain and nausea at this time and was noticed to have some trouble with word finding. A CT angio chest was obtained revealing an acute nonocclusive pulmonary embolus in the lobar branch of the right middle lobe. Further findings indicate that she may have a nonspecific malabsorptive/diarrheal state. A CT head was obtained without evidence of acute intracranial abnormality with moderate cerebral atrophy and chronic small vessel ischemic disease. With evidence of a PE, a decision was made to hold anticoagulation and transfer to [facility] for MRI brain to evaluate for acute stroke with evidence of disorientation and aphasia. Neurology was consulted for ongoing encephalopathy prior to MRI brain completion.  At baseline,  Joyce Palmer is able to walk with a cane, communicate without difficulties, cares for her husband with Alzheimer's dementia, and is noted to be oriented. Her daughter states that she has recently needed a bit more help with getting around and states she feels her mother needs help at home with normal chores. She states that when her mother is not feeling well normally, she has decreased PO intake.  LKW: Prior to 2/16 at 07:30 tpa given?: no, outside of time window  ROS: Unable to obtain due to altered mental status.   Past Medical History:  Diagnosis Date  . Bronchitis   . COPD (chronic obstructive pulmonary disease) (HCC)   . Diabetes mellitus without complication (HCC)   . Hypertension   . Tachycardia    No family history on file.  Social History:   reports that she has been smoking. She has never used smokeless tobacco. She reports that she does not drink alcohol and does not use drugs.  Medications Current Outpatient Medications  Medication Instructions  . alendronate (FOSAMAX) 70 mg, Oral, Weekly  . allopurinol (ZYLOPRIM) 100 mg, Oral, Daily  . amitriptyline (ELAVIL) 50 mg, Oral, Daily at bedtime  . aspirin 325 mg, Oral, Daily  . atenolol (TENORMIN) 25 mg, Oral, Daily  . atorvastatin (LIPITOR) 10 mg, Oral, Daily  . Azelastine-Fluticasone 137-50 MCG/ACT SUSP Nasal  . Azelastine-Fluticasone 137-50 MCG/ACT SUSP 2 sprays, Nasal, 2 times daily  . calcium carbonate (OS-CAL) 1250 (500 Ca) MG chewable tablet 1 tablet, Oral, 3 times daily  . clotrimazole-betamethasone (LOTRISONE) cream 1 application, Topical, 2 times daily  . furosemide (LASIX) 20 mg, Oral, 2 times daily  . ipratropium (ATROVENT HFA) 17 MCG/ACT inhaler 2 puffs, Inhalation, Every 6 hours PRN  . levothyroxine (SYNTHROID) 50 mcg, Oral,  Daily before breakfast  . magnesium oxide (MAG-OX) 400 mg, Oral, 2 times daily  . naloxone (NARCAN) nasal spray 4 mg/0.1 mL 1 spray, Nasal, Daily PRN  . oxyCODONE-acetaminophen (PERCOCET)  7.5-325 MG tablet 1 tablet, Oral, Every 8 hours PRN  . pantoprazole (PROTONIX) 40 mg, Oral, Daily  . pioglitazone (ACTOS) 30 mg, Oral, Daily  . sertraline (ZOLOFT) 50 mg, Oral, Daily  . tiZANidine (ZANAFLEX) 4 mg, Oral, 3 times daily PRN  . Vitamin D (Ergocalciferol) (DRISDOL) 50,000 Units, Oral, Every 7 days   Exam: Current vital signs: BP 130/88   Pulse (!) 132   Temp 98.5 F (36.9 C) (Axillary)   Resp 20   Ht 5\' 3"  (1.6 m)   Wt 46.1 kg   SpO2 94%   BMI 18.00 kg/m  Vital signs in last 24 hours: Temp:  [97.8 F (36.6 C)-99.3 F (37.4 C)] 98.5 F (36.9 C) (02/17 1332) Pulse Rate:  [107-133] 132 (02/17 1200) Resp:  [12-27] 20 (02/17 1200) BP: (107-151)/(70-97) 130/88 (02/17 1200) SpO2:  [87 %-100 %] 94 % (02/17 1200) Weight:  [46.1 kg-48.1 kg] 46.1 kg (02/17 1104)  GENERAL: Drowsy, sitting in bed feeding herself lunch, cachetic appearing. HEAD: Normocephalic and atraumatic EENT: Normal conjunctiva, no OP obstruction LUNGS - Normal respiratory effort, non-labored breathing CV - tachycardic on cardiac monitor with heart rate 130-140's, extremities without edema ABDOMEN - Soft, non-distended Ext: warm, without obvious deformity  NEURO:  Mental Status: drowsy, wakes to voice. Alert to person. When asked about place she states "I am in a doctor and nurses". She states the year is "two-two-two-two" and is a poor historian. When asked about the events leading up to her hospitalization she states "I don't remember that". She claims she has not fallen and then later perseverates on "my knees just gave out on me in front of the house" but is unable to provide further information. She has trouble with word finding and states "I know what I want to tell you but it just don't want to come out". Attention is poor. She states the months in order from 07-25-1997 and then perseverates on the month July, unable to name the rest of the months of the year. Fluency, attention, and repetition are  not intact. Naming is intact.  Speech is mildly dysarthric.  Cranial Nerves:  II: PERRL 3 mm/brisk. Visual fields full.  III, IV, VI: EOMI. Intermittent trouble with fixation of examiner.  V: States sensation is not equal to face, but is unable to elaborate further.   VII: Face is symmetric resting and smiling.  VIII: Hearing intact to voice IX, X: Palate elevation is symmetric. Hypophonic speech.  XI: Normal sternocleidomastoid and trapezius muscle strength XII: Tongue protrudes midline.   Motor: 4/5 strength in bilateral upper extremities with antigravity movement and without pronator drift. Grip strength is weak with decreased spontaneous and fine motor movements of the hands bilaterally. August is noted in bilateral upper and lower extremities. Lower extremities 3/5 with antigravity movement, patient barely able to lift legs off of bed but is able to maintain elevation without drift.  Sensation: intact to light touch bilaterally in upper and lower extremities, when asked about laterality in upper extremities she states they feel slightly different due to one side being "more rough" but does not provide further information. Extinction intact. Coordination: FTN intact bilaterally. HKS unable to be assessed due to knee pain. No pronator drift. DTRs: 2+ patellae, 3+ bilateral biceps and brachioradialis  Gait- deferred  Labs I  have reviewed labs in epic and the results pertinent to this consultation are: CBC    Component Value Date/Time   WBC 7.5 01/26/2021 1217   RBC 4.24 01/26/2021 1217   HGB 12.6 01/26/2021 1217   HCT 39.0 01/26/2021 1217   PLT 232 01/26/2021 1217   MCV 92.0 01/26/2021 1217   MCH 29.7 01/26/2021 1217   MCHC 32.3 01/26/2021 1217   RDW 18.0 (H) 01/26/2021 1217   LYMPHSABS 2.2 01/26/2021 1217   MONOABS 0.6 01/26/2021 1217   EOSABS 0.0 01/26/2021 1217   BASOSABS 0.1 01/26/2021 1217  CMP     Component Value Date/Time   NA 134 (L) 01/26/2021 1217   K 3.5  01/26/2021 1217   CL 97 (L) 01/26/2021 1217   CO2 25 01/26/2021 1217   GLUCOSE 89 01/26/2021 1217   BUN 16 01/26/2021 1217   CREATININE 1.02 (H) 01/26/2021 1217   CALCIUM 8.5 (L) 01/26/2021 1217   PROT 7.4 01/26/2021 1217   ALBUMIN 2.9 (L) 01/26/2021 1217   AST 17 01/26/2021 1217   ALT 15 01/26/2021 1217   ALKPHOS 53 01/26/2021 1217   BILITOT 1.2 01/26/2021 1217   GFRNONAA 57 (L) 01/26/2021 1217   GFRAA 27 (L) 02/13/2018 1224   Lab Results  Component Value Date   CALCIUM 8.5 (L) 01/26/2021  Magnesium 1.6 Lab Results  Component Value Date   TSH 2.968 01/25/2021   Lipid Panel  No results found for: CHOL, TRIG, HDL, CHOLHDL, VLDL, LDLCALC, LDLDIRECT  Imaging I have reviewed the images obtained: CT-scan of the brain No CT evidence of acute intracranial abnormality. Moderate cerebral atrophy and chronic small vessel ischemic disease. Comparatively mild cerebellar atrophy.  Bilateral ethmoid and maxillary paranasal sinus disease at the imaged levels, as described.  MRI examination of the brain 1. No acute or reversible finding. Extensive chronic small-vessel ischemic changes throughout the brain. 2. Rhinosinusitis of the maxillary sinuses. Left mastoid effusion.  Echocardiogram pending  Vascular ultrasound lower extremities without evidence of DVT  Assessment: 75 year old female with history as above who presents for the second time this month for evaluation of weakness with electrolyte abnormalities with acute onset encephalopathy as evidence by disorientation, drowsiness, and aphasia. Was transferred from [facility] to [place] after CT imaging revealed evidence of PE for MRI brain evaluation of stroke prior to starting anticoagulation therapy.  - Examination reveals confusion, disorientation, aphasia, drowsy patient with poor attention, increased tone throughout with coarse tremor. Findings indicate acute change from reported baseline mental and physical  status.  -CT head without evidence of acute intracranial abnormality but with moderate cerebral atrophy and chronic small vessel ischemic disease. -MRI brain without acute or reversible findings. Extensive chronic small-vessel ischemic changes noted throughout the brain.  - Labs significant for hypocalcemia, hypomagnesemia,  - DDx includes ischemic stroke (current acute PE), toxic metabolic encephalopathy, and acute delirium. Further work-up pending.  - Home medications include multiple sedating agents including amitriptyline, percocet, sertraline, and tizanidine that can contribute to weakness and falls in the elderly.   Impression:  Acute encephalopathy, likely metabolic Delirium  Acute PE  Recommendations: - Management of electrolyte abnormalities: hypomagnesemia, hypocalcemia - Phosphorous level, lipid panel, hemoglobin a1c, vitamin b12 pending - Recommend PT/PTT/INR  - MRI without evidence of acute findings, okay for anticoagulation as appropriate for treatment of PE.  - Try to minimize deliriogenic medications as much as possible (J Am Geriatr Soc. 2012 Apr;60(4):616-31): benzodiazepines, anticholinergics, diphenhydramine, antihistamines, narcotics, Ambien/Lunesta/Sonata etc. - Environmental support for delirium: Lights  on during the day, patient up and out of bed as much as is feasible, OT/PT, quiet dimly lit room at night, reorient patient often.    Joyce BoastStevi Palmer, AGAC-NP [facility] Pager: [phone]  Attending addendum Agree with history physical above I [facility] In brief, the patient's family is concerned that she is deteriorating in terms of her ADLs and mentation over the past few months. She has a past medical history of diabetes, hyponatremia, hypertension, hypothyroidism, severe pulmonary hypertension, heart failure with preserved ejection fraction, hypertension, CKD 3, admitted to [facility]  a few weeks ago with fatigue abdominal pain weight loss and what seems like failure to thrive.  Found to be hyper hyponatremic, dehydrated, hypokalemic, hypomagnesemic and hypophosphatemic with a mild AKI.  Discharged home with discontinuation of her diuretics.  Also noted to have a pulmonary embolus. Continues to be weak to a point where she requires assistance walking and that is why she was brought in for evaluation at [facility] from where she was sent into vascular hospital for concern for stroke and need for an MRI. [facility] [place]-feels that she has deteriorated quite a lot but that timeline is over a year.  Not a very good and consistent history provided.  Her examination: She is awake alert and oriented x3. She has poor attention concentration She does not have dysarthria which has slight vocal tremor She is able to follow all commands Cranial nerves: Pupils equal round react light, extraocular movements intact, visual fields full, face symmetric, tongue and palate midline. Motor examination shows tremors at rest with possible cogwheeling in both upper extremities.  Both upper extremities are 4+/5.  Both lower extremities are barely 3/5. Sensory exam: Intact to touch without extinction Coordination: Increased tone with cogwheeling in both upper extremities-there is a component of paratonia as well. Labs revealed hyponatremia, AKI.  Also hypomagnesemia and hypo-Cal C. Mia. An MRI of the brain was just completed-personally reviewed-no stroke.    Assessment: Subacute to chronic cognitive decline along with tremulousness and multiple metabolic derangements likely contributing to the subacute decline. Pulmonary embolus also not helping.  Multiple metabolic derangements could be causing the tremulousness including hypocalcemia but could also be a primary underlying neurological etiology-this should be investigated after correction of the toxic  metabolic derangements. Home medication list includes a lot of sedating medications-polypharmacy also could be contributing  Impression: -Subacute decline in cognition with acute encephalopathy-likely metabolic -Polypharmacy -Pulmonary embolism -Tremulousness-multifactorial including considering a underlying neurological etiology such as Parkinson's-that evaluation needs to be done outpatient.  Recommendations MRI brain completed-reviewed-no acute changes. No need for further imaging of the head. No contraindication to anticoagulation from a neurological standpoint I will check B12 and thiamine levels along with ammonia.  TSH was normal. Minimize sedating medications Management of metabolic derangements per primary team as you are We will make further recommendations based on test results but mainstay of her management is fixing metabolic derangements and outpatient neurology follow up for dementia evaluation.  -- Milon DikesAshish Zyrion Coey, MD Neurologist [facility] Pager: [phone]

## 2021-01-26 NOTE — Progress Notes (Signed)
Patient was able to drink 10ccs of water, with no coughing. Made MD aware. No other needs identified. Will continue to monitor.

## 2021-01-26 NOTE — Progress Notes (Signed)
Delay in patient care. MRI staff attempted to get patient down for MRI; however, MD was in the room and vascular radiology waiting outside of the room to be seen next.

## 2021-01-27 ENCOUNTER — Other Ambulatory Visit (HOSPITAL_COMMUNITY): Payer: Self-pay | Admitting: Internal Medicine

## 2021-01-27 ENCOUNTER — Observation Stay (HOSPITAL_COMMUNITY): Payer: Medicare Other

## 2021-01-27 DIAGNOSIS — I110 Hypertensive heart disease with heart failure: Secondary | ICD-10-CM | POA: Diagnosis present

## 2021-01-27 DIAGNOSIS — F32A Depression, unspecified: Secondary | ICD-10-CM | POA: Diagnosis present

## 2021-01-27 DIAGNOSIS — R5381 Other malaise: Secondary | ICD-10-CM | POA: Diagnosis present

## 2021-01-27 DIAGNOSIS — E440 Moderate protein-calorie malnutrition: Secondary | ICD-10-CM | POA: Diagnosis present

## 2021-01-27 DIAGNOSIS — E785 Hyperlipidemia, unspecified: Secondary | ICD-10-CM | POA: Diagnosis present

## 2021-01-27 DIAGNOSIS — Z20822 Contact with and (suspected) exposure to covid-19: Secondary | ICD-10-CM | POA: Diagnosis present

## 2021-01-27 DIAGNOSIS — Z79899 Other long term (current) drug therapy: Secondary | ICD-10-CM | POA: Diagnosis not present

## 2021-01-27 DIAGNOSIS — G9341 Metabolic encephalopathy: Secondary | ICD-10-CM | POA: Diagnosis present

## 2021-01-27 DIAGNOSIS — N179 Acute kidney failure, unspecified: Secondary | ICD-10-CM | POA: Diagnosis present

## 2021-01-27 DIAGNOSIS — I2693 Single subsegmental pulmonary embolism without acute cor pulmonale: Secondary | ICD-10-CM | POA: Diagnosis present

## 2021-01-27 DIAGNOSIS — E876 Hypokalemia: Secondary | ICD-10-CM | POA: Diagnosis present

## 2021-01-27 DIAGNOSIS — R4701 Aphasia: Secondary | ICD-10-CM | POA: Diagnosis present

## 2021-01-27 DIAGNOSIS — E119 Type 2 diabetes mellitus without complications: Secondary | ICD-10-CM | POA: Diagnosis present

## 2021-01-27 DIAGNOSIS — I2609 Other pulmonary embolism with acute cor pulmonale: Secondary | ICD-10-CM | POA: Diagnosis not present

## 2021-01-27 DIAGNOSIS — I5032 Chronic diastolic (congestive) heart failure: Secondary | ICD-10-CM | POA: Diagnosis present

## 2021-01-27 DIAGNOSIS — F419 Anxiety disorder, unspecified: Secondary | ICD-10-CM | POA: Diagnosis present

## 2021-01-27 DIAGNOSIS — Z9049 Acquired absence of other specified parts of digestive tract: Secondary | ICD-10-CM | POA: Diagnosis not present

## 2021-01-27 DIAGNOSIS — R531 Weakness: Secondary | ICD-10-CM | POA: Diagnosis present

## 2021-01-27 DIAGNOSIS — Z681 Body mass index (BMI) 19 or less, adult: Secondary | ICD-10-CM | POA: Diagnosis not present

## 2021-01-27 DIAGNOSIS — J449 Chronic obstructive pulmonary disease, unspecified: Secondary | ICD-10-CM | POA: Diagnosis present

## 2021-01-27 DIAGNOSIS — J9601 Acute respiratory failure with hypoxia: Secondary | ICD-10-CM | POA: Diagnosis not present

## 2021-01-27 DIAGNOSIS — E871 Hypo-osmolality and hyponatremia: Secondary | ICD-10-CM | POA: Diagnosis present

## 2021-01-27 DIAGNOSIS — F17200 Nicotine dependence, unspecified, uncomplicated: Secondary | ICD-10-CM | POA: Diagnosis present

## 2021-01-27 DIAGNOSIS — E039 Hypothyroidism, unspecified: Secondary | ICD-10-CM | POA: Diagnosis present

## 2021-01-27 DIAGNOSIS — R000 Tachycardia, unspecified: Secondary | ICD-10-CM | POA: Diagnosis present

## 2021-01-27 LAB — LIPID PANEL
Cholesterol: 93 mg/dL (ref 0–200)
HDL: 33 mg/dL — ABNORMAL LOW (ref >40)
LDL Cholesterol: 45 mg/dL (ref 0–99)
Total CHOL/HDL Ratio: 2.8 RATIO
Triglycerides: 73 mg/dL (ref <150)
VLDL: 15 mg/dL (ref 0–40)

## 2021-01-27 LAB — GLUCOSE, CAPILLARY
Glucose-Capillary: 86 mg/dL (ref 70–99)
Glucose-Capillary: 121 mg/dL — ABNORMAL HIGH (ref 70–99)
Glucose-Capillary: 127 mg/dL — ABNORMAL HIGH (ref 70–99)
Glucose-Capillary: 95 mg/dL (ref 70–99)

## 2021-01-27 LAB — ECHOCARDIOGRAM COMPLETE
Height: 63 in
S' Lateral: 1.9 cm
Weight: 1626.11 oz

## 2021-01-27 LAB — CBC
HCT: 35.9 % — ABNORMAL LOW (ref 36.0–46.0)
Hemoglobin: 11.3 g/dL — ABNORMAL LOW (ref 12.0–15.0)
MCH: 29 pg (ref 26.0–34.0)
MCHC: 31.5 g/dL (ref 30.0–36.0)
MCV: 92.1 fL (ref 80.0–100.0)
Platelets: 201 10*3/uL (ref 150–400)
RBC: 3.9 MIL/uL (ref 3.87–5.11)
RDW: 18.3 % — ABNORMAL HIGH (ref 11.5–15.5)
WBC: 7.1 10*3/uL (ref 4.0–10.5)
nRBC: 0 % (ref 0.0–0.2)

## 2021-01-27 LAB — HEPARIN LEVEL (UNFRACTIONATED)
Heparin Unfractionated: <0.1 IU/mL — ABNORMAL LOW (ref 0.30–0.70)
Heparin Unfractionated: 0.32 IU/mL (ref 0.30–0.70)
Heparin Unfractionated: 0.32 IU/mL (ref 0.30–0.70)

## 2021-01-27 LAB — VITAMIN B12: Vitamin B-12: 912 pg/mL (ref 180–914)

## 2021-01-27 MED ORDER — APIXABAN (ELIQUIS) VTE STARTER PACK (10MG AND 5MG): ORAL_TABLET | ORAL | 0 refills | Status: DC

## 2021-01-27 MED ORDER — SERTRALINE HCL 50 MG PO TABS
50.0000 mg | ORAL_TABLET | Freq: Every day | ORAL | Status: DC
  Administered 2021-01-27 – 2021-01-30 (×4): 50 mg via ORAL
  Filled 2021-01-27 (×4): qty 1

## 2021-01-27 MED ORDER — ATENOLOL 25 MG PO TABS
25.0000 mg | ORAL_TABLET | Freq: Every day | ORAL | Status: DC
  Administered 2021-01-27 – 2021-01-30 (×4): 25 mg via ORAL
  Filled 2021-01-27 (×4): qty 1

## 2021-01-27 MED ORDER — HEPARIN BOLUS VIA INFUSION
1500.0000 [IU] | Freq: Once | INTRAVENOUS | Status: AC
  Administered 2021-01-27: 1500 [IU] via INTRAVENOUS
  Filled 2021-01-27: qty 1500

## 2021-01-27 MED ORDER — ATORVASTATIN CALCIUM 10 MG PO TABS
10.0000 mg | ORAL_TABLET | Freq: Every day | ORAL | Status: DC
  Administered 2021-01-27 – 2021-01-30 (×4): 10 mg via ORAL
  Filled 2021-01-27 (×4): qty 1

## 2021-01-27 MED ORDER — LEVOTHYROXINE SODIUM 50 MCG PO TABS
50.0000 ug | ORAL_TABLET | Freq: Every day | ORAL | Status: DC
  Administered 2021-01-28 – 2021-01-30 (×3): 50 ug via ORAL
  Filled 2021-01-27 (×3): qty 1

## 2021-01-27 MED ORDER — HEPARIN (PORCINE) 25000 UT/250ML-% IV SOLN
950.0000 [IU]/h | INTRAVENOUS | Status: DC
  Administered 2021-01-27: 950 [IU]/h via INTRAVENOUS
  Filled 2021-01-27: qty 250

## 2021-01-27 MED FILL — ELIQUIS STARTER PACK 5 MG T: 5 | 28 days supply | Qty: 74 | Fill #0

## 2021-01-27 NOTE — Progress Notes (Signed)
Pharmacy Brief Note - Anticoagulation Follow Up:  Patient is on heparin infusion for PE. For full history, see note by Larene Beach, PharmD from earlier today.   Assessment:  Confirmatory HL = 0.32 remains therapeutic on heparin infusion of 950 units/hr  Confirmed with RN that heparin infusing at correct rate with no issues. No signs of bleeding  Goal: HL 0.3 - 0.7  Plan:  Continue heparin infusion at current rate of 950 units/hr  CBC, HL with AM labs tomorrow  Cindi Carbon, PharmD 01/27/21 8:57 PM

## 2021-01-27 NOTE — Evaluation (Signed)
Speech Language Pathology Evaluation Patient Details Name: Joyce Palmer MRN: [id number] DOB: June 16, 1946 Today's Date: 01/27/2021 Time: 8182-9937 SLP Time Calculation (min) (ACUTE ONLY): 24 min  Problem List:  Patient Active Problem List   Diagnosis Date Noted  . Acute metabolic encephalopathy 01/25/2021  . Hyponatremia 01/17/2021  . Acute respiratory failure with hypoxia and hypercapnia (HCC) 11/04/2016  . Chronic obstructive pulmonary disease, unspecified (HCC) 11/04/2016  . CHF (congestive heart failure) (HCC) 11/03/2016  . CKD (chronic kidney disease) 11/03/2016  . Essential hypertension 11/03/2016  . Pulmonary insufficiency 11/03/2016  . SIRS (systemic inflammatory response syndrome) (HCC) 11/03/2016  . Type 2 diabetes mellitus (HCC) 11/03/2016  . Graves' disease 10/19/2014   Past Medical History:  Past Medical History:  Diagnosis Date  . Bronchitis   . COPD (chronic obstructive pulmonary disease) (HCC)   . Diabetes mellitus without complication (HCC)   . Hypertension   . Tachycardia    Past Surgical History:  Past Surgical History:  Procedure Laterality Date  . ABDOMINAL HYSTERECTOMY    . CHOLECYSTECTOMY    . spleenectomy     HPI:  75 y.o. female with medical history significant for DM2, hyponatremia, HTN, HFpEF. Presenting with altered mental status.  CT/MRI head negative for any acute intracranial abnormality.  Due to complaints of chest pain and nausea CT angio chest was done which came back positive for acute nonocclusive pulmonary embolus in the lobar branch of the right middle lobe. Dx include acute metabolic encephalopathy, ARF secondary to acute pulmonary embolism, debility.  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   Assessment / Plan / Recommendation Clinical Impression  Pt presents with mild disoriention to elements of time; otherwise oriented to person/place. Language comprehension and expression WNL. Basic attention,  verbal memory, and awareness were [facility]. Processing was mildly delayed.  Speech clear, fluent, and without dysarthria.  Pt expressed grief about losses she has experienced in her life.  She is tired and verbalized gratitude that she can be D/C'd to the care of her niece. Provided active listening and encouragement.  There are no SLP needs at this time. Pt will benefit from supervision initially. OT/PT evals are pending.  SLP will sign off.    SLP Assessment  SLP Recommendation/Assessment: Patient does not need any further Speech Lanaguage Pathology Services SLP Visit Diagnosis: Cognitive communication deficit (R41.841)    Follow Up Recommendations  None    Frequency and Duration   n/a        SLP Evaluation Cognition  Overall Cognitive Status: Impaired/Different from baseline Arousal/Alertness: Awake/alert Orientation Level: Oriented to person;Oriented to place;Disoriented to time Attention: Selective Selective Attention: Appears intact Memory: Appears intact Awareness: Impaired Awareness Impairment: Intellectual impairment Safety/Judgment: Appears intact       Comprehension  Auditory Comprehension Overall Auditory Comprehension: Appears within functional limits for tasks assessed Yes/No Questions: Within Functional Limits Commands: Within Functional Limits Conversation: Simple Visual Recognition/Discrimination Discrimination: Within Function Limits Reading Comprehension Reading Status: Not tested    Expression Expression Primary Mode of Expression: Verbal Verbal Expression Overall Verbal Expression: Appears within functional limits for tasks assessed Initiation: No impairment Level of Generative/Spontaneous Verbalization: Conversation Repetition: No impairment Naming: No impairment Pragmatics: No impairment Written Expression Dominant Hand:  (self-feeding with LUE) Written Expression: Not tested   Oral / Motor  Oral Motor/Sensory Function Overall Oral Motor/Sensory  Function: Within functional limits Motor Speech Overall Motor Speech: Appears within functional limits for tasks assessed   GO  [name] [name] 01/27/2021, 2:12 PM  [name]. [name], MA CCC/SLP Acute Rehabilitation Services Office number [phone] Pager [phone]

## 2021-01-27 NOTE — Progress Notes (Signed)
OT Cancellation Note  Patient Details Name: Joyce Palmer MRN: [id number] DOB: November 20, 1946   Cancelled Treatment:    Reason Eval/Treat Not Completed: Medical issues which prohibited therapy. Patient with subtherapeutic heparin level, will defer eval to 2/19 as appropriate.  Marlyce Huge OT OT pager: [phone]   Carmelia Roller 01/27/2021, 12:52 PM

## 2021-01-27 NOTE — Progress Notes (Addendum)
Shift Summary:   Patient remained alert and oriented with periods of confusion. Patient was able to feed self today, with assistance with setting up food and drinks. Worked with speech today. HR remained tachy in the upper 120-130's during this shift. Remained in a yellow MEW's on and off due to HR thoroughout this shift. Otherwise vital signs where stable during this shift. Echo completed during this shift. Heparin was therapeutic x 1 during this shift, next draw at 2000. Pharmacy will place order. Was placed on 2L of nasal cannula per night RN, patient did not need oxygen during the day, when transitioned off, patient SATs remained in the upper 90s. No signs and symptoms of distress noted when O2 was removed. Placed on c-diff precautions today for frequent loose stools on 2/17. No stool during this shift on 2/18. Cousin visited during this shift. Will continue to monitor.  POC: Work with PT/OT when warranted, Monitor oxygenation needs, monitor HR/Replace electrolytes. placement versus home with niece in [place]?

## 2021-01-27 NOTE — Progress Notes (Addendum)
PROGRESS NOTE  Joyce ContrasSara L Huhn EXB:[id number] DOB: [birth year] DOA: 01/25/2021 PCP: Drucie OpitzArnold, Gordon, MD  HPI/Recap of past 24 hours:  Joyce Palmer is a 75 y.o. female with medical history significant of DM2, hyponatremia, HTN, HFpEF. Presenting with altered mental status.  CT head was negative for any acute intracranial abnormality.  Due to complaints of chest pain and nausea CT angio chest was done which came back positive for acute nonocclusive pulmonary embolus in the lobar branch of the right middle lobe.  Due to her persistent confusion MRI was obtained to rule out any underlying intracranial abnormality prior to starting anticoagulation.  Neurology was consulted for encephalopathy.  No evidence of acute intracranial findings on MRI brain.  She was started on IV heparin for acute pulmonary embolism.  01/27/21: Seen and examined at bedside.  She is alert oriented x3.  She denies having any chest pain at the time of this visit.  She is tachycardic with heart rate in the 130's and on oxygen supplementation.  Assessment/Plan: Active Problems:   Acute metabolic encephalopathy  Resolved acute metabolic encephalopathy likely in the setting of TIA or acute illness This morning she is alert and oriented x3. CT head and MRI brain negative for acute intracranial abnormalities UA negative for pyuria. Chest x-ray no acute cardiopulmonary disease.  Acute right-sided pulmonary embolism. Bilateral lower extremity Doppler ultrasound negative for DVT on 01/26/21. Started on heparin drip in the ED, continue Switch to oral anticoagulation on 01/28/21. Will check for Eliquis coverage  Persistent sinus tachycardia Heart rate in the 140's on twelve-lead EKG, personally reviewed. Resume home atenolol Obtain 2D echo. Continue to closely monitor vital signs.  Acute hypoxic respiratory failure secondary to acute pulmonary embolism No nausea supplementation at baseline Currently requiring 2 L to maintain O2  saturation greater than 92%. Will obtain home oxygen evaluation prior to DC.  Physical debility PT OT to assess Fall precautions  Chronic mild hyponatremia Appears to be at her baseline with serum sodium 134. Asymptomatic.  Hypomagnesemia Magnesium 1.6 Replete orally  Hypothyroidism Resume home levothyroxine  Chronic anxiety/depression Resume home Zoloft  Hyperlipidemia Resume home Lipitor.  Type 2 diabetes, well controlled Continue to hold off home po hypoglycemics HgA1C 7.7 on 01/26/21   Code Status: Full code.  Family Communication: None at bedside.  Disposition Plan: Likely will discharge to home.   Consultants:  None.  Procedures:  2D echo.  Antimicrobials:  None.  DVT prophylaxis: Heparin drip.  Status is: Inpatient    Dispo: The patient is from: Home.               Anticipated d/c is to: Home.               Anticipated d/c date is: 01/29/2021.               Patient currently not stable for discharge due to ongoing tachycardia and acute hypoxemia.   Difficult to place patient N/A        Objective: Vitals:   01/27/21 1307 01/27/21 1406 01/27/21 1500 01/27/21 1600  BP: 139/83  (!) 144/84 133/88  Pulse:    (!) 119  Resp: 18  18 18   Temp:  98.3 F (36.8 C)    TempSrc:  Axillary    SpO2: 98%   97%  Weight:      Height:        Intake/Output Summary (Last 24 hours) at 01/27/2021 1651 Last data filed at 01/27/2021 1400 Gross per 24 hour  Intake 496.37 ml  Output 200 ml  Net 296.37 ml   Filed Weights   01/25/21 1638 01/26/21 1104  Weight: 48.1 kg 46.1 kg    Exam:  . General: 75 y.o. year-old female well developed well nourished in no acute distress.  Alert and oriented x3. . Cardiovascular: Tachycardic with no rubs or gallops.  No thyromegaly or JVD noted.   Marland Kitchen Respiratory: Clear to auscultation with no wheezes or rales. Good inspiratory effort. . Abdomen: Soft nontender nondistended with normal bowel sounds x4  quadrants. . Musculoskeletal: No lower extremity edema. 2/4 pulses in all 4 extremities. . Skin: No ulcerative lesions noted or rashes. . Psychiatry: Mood is appropriate for condition and setting   Data Reviewed: CBC: Recent Labs  Lab 01/25/21 1537 01/26/21 1217 01/27/21 0215  WBC 7.4 7.5 7.1  NEUTROABS  --  4.6  --   HGB 12.5 12.6 11.3*  HCT 37.7 39.0 35.9*  MCV 88.1 92.0 92.1  PLT 270 232 201   Basic Metabolic Panel: Recent Labs  Lab 01/25/21 1537 01/26/21 1217  NA 131* 134*  K 4.3 3.5  CL 95* 97*  CO2 25 25  GLUCOSE 139* 89  BUN 21 16  CREATININE 1.14* 1.02*  CALCIUM 8.7* 8.5*  MG  --  1.6*  PHOS  --  3.2   GFR: Estimated Creatinine Clearance: 34.7 mL/min (A) (by C-G formula based on SCr of 1.02 mg/dL (H)). Liver Function Tests: Recent Labs  Lab 01/25/21 1537 01/26/21 1217  AST 20 17  ALT 13 15  ALKPHOS 50 53  BILITOT 1.1 1.2  PROT 7.9 7.4  ALBUMIN 3.2* 2.9*   No results for input(s): LIPASE, AMYLASE in the last 168 hours. Recent Labs  Lab 01/26/21 2016  AMMONIA 13   Coagulation Profile: No results for input(s): INR, PROTIME in the last 168 hours. Cardiac Enzymes: No results for input(s): CKTOTAL, CKMB, CKMBINDEX, TROPONINI in the last 168 hours. BNP (last 3 results) No results for input(s): PROBNP in the last 8760 hours. HbA1C: Recent Labs    01/26/21 1229  HGBA1C 7.7*   CBG: Recent Labs  Lab 01/26/21 1734 01/26/21 2224 01/27/21 0757 01/27/21 1109 01/27/21 1616  GLUCAP 102* 89 86 121* 127*   Lipid Profile: Recent Labs    01/27/21 0215  CHOL 93  HDL 33*  LDLCALC 45  TRIG 73  CHOLHDL 2.8   Thyroid Function Tests: Recent Labs    01/25/21 2211  TSH 2.968   Anemia Panel: Recent Labs    01/27/21 0215  VITAMINB12 912   Urine analysis:    Component Value Date/Time   COLORURINE YELLOW 01/25/2021 1630   APPEARANCEUR CLEAR 01/25/2021 1630   LABSPEC 1.005 01/25/2021 1630   PHURINE 7.5 01/25/2021 1630   GLUCOSEU  NEGATIVE 01/25/2021 1630   HGBUR NEGATIVE 01/25/2021 1630   BILIRUBINUR NEGATIVE 01/25/2021 1630   KETONESUR NEGATIVE 01/25/2021 1630   PROTEINUR NEGATIVE 01/25/2021 1630   NITRITE NEGATIVE 01/25/2021 1630   LEUKOCYTESUR NEGATIVE 01/25/2021 1630   Sepsis Labs: (procalcitonin:4,lacticidven:4)  ) Recent Results (from the past 240 hour(s))  Resp Panel by RT-PCR (Flu A&B, Covid) Nasopharyngeal Swab     Status: None   Collection Time: 01/25/21  4:32 PM   Specimen: Nasopharyngeal Swab; Nasopharyngeal(NP) swabs in vial transport medium  Result Value Ref Range Status   SARS Coronavirus 2 by RT PCR NEGATIVE NEGATIVE Final    Comment: (NOTE) SARS-CoV-2 target nucleic acids are NOT DETECTED.  The SARS-CoV-2 RNA is generally detectable in  upper respiratory specimens during the acute phase of infection. The lowest concentration of SARS-CoV-2 viral copies this assay can detect is 138 copies/mL. A negative result does not preclude SARS-Cov-2 infection and should not be used as the sole basis for treatment or other patient management decisions. A negative result may occur with  improper specimen collection/handling, submission of specimen other than nasopharyngeal swab, presence of viral mutation(s) within the areas targeted by this assay, and inadequate number of viral copies(<138 copies/mL). A negative result must be combined with clinical observations, patient history, and epidemiological information. The expected result is Negative.  Fact Sheet for Patients:  BloggerCourse.com  Fact Sheet for Healthcare Providers:  SeriousBroker.it  This test is no t yet approved or cleared by the [place] FDA and  has been authorized for detection and/or diagnosis of SARS-CoV-2 by FDA under an Emergency Use Authorization (EUA). This EUA will remain  in effect (meaning this test can be used) for the duration of the COVID-19 declaration  under Section 564(b)(1) of the Act, 21 U.S.C.section 360bbb-3(b)(1), unless the authorization is terminated  or revoked sooner.       Influenza A by PCR NEGATIVE NEGATIVE Final   Influenza B by PCR NEGATIVE NEGATIVE Final    Comment: (NOTE) The Xpert Xpress SARS-CoV-2/FLU/RSV plus assay is intended as an aid in the diagnosis of influenza from Nasopharyngeal swab specimens and should not be used as a sole basis for treatment. Nasal washings and aspirates are unacceptable for Xpert Xpress SARS-CoV-2/FLU/RSV testing.  Fact Sheet for Patients: BloggerCourse.com  Fact Sheet for Healthcare Providers: SeriousBroker.it  This test is not yet approved or cleared by the [place] FDA and has been authorized for detection and/or diagnosis of SARS-CoV-2 by FDA under an Emergency Use Authorization (EUA). This EUA will remain in effect (meaning this test can be used) for the duration of the COVID-19 declaration under Section 564(b)(1) of the Act, 21 U.S.C. section 360bbb-3(b)(1), unless the authorization is terminated or revoked.  Performed at [facility], [address] [place], [place] [address]       Studies: MR BRAIN WO CONTRAST  Result Date: 01/26/2021 CLINICAL DATA:  Transient ischemic attack. Slurred speech and difficulty walking. EXAM: MRI HEAD WITHOUT CONTRAST TECHNIQUE: Multiplanar, multiecho pulse sequences of the brain and surrounding structures were obtained without intravenous contrast. COMPARISON:  Head CT yesterday. FINDINGS: Brain: Diffusion imaging does not show any acute or subacute infarction. Chronic small-vessel ischemic changes are seen throughout the pons. No focal cerebellar insult. Cerebral hemispheres show pronounced chronic small-vessel ischemic changes throughout the deep and subcortical white matter. No cortical or large vessel territory infarction. No mass lesion, hemorrhage, hydrocephalus or  extra-axial collection. Vascular: Major vessels at the base of the brain show flow. Skull and upper cervical spine: Negative Sinuses/Orbits: Rhinosinusitis of the maxillary sinuses. Orbits negative. Other: Left mastoid effusion. IMPRESSION: 1. No acute or reversible finding. Extensive chronic small-vessel ischemic changes throughout the brain. 2. Rhinosinusitis of the maxillary sinuses. Left mastoid effusion. Electronically Signed   By: Paulina Fusi M.D.   On: 01/26/2021 17:34   ECHOCARDIOGRAM COMPLETE  Result Date: 01/27/2021    ECHOCARDIOGRAM REPORT   Patient Name:   KIRANDEEP FARISS Date of Exam: 01/27/2021 Medical Rec #:  [id number]      Height:       63.0 in Accession #:    [id number]     Weight:       101.6 lb Date of Birth:  Aug 15, 1946  BSA:          1.450 m Patient Age:    74 years       BP:           139/83 mmHg Patient Gender: F              HR:           135 bpm. Exam Location:  Inpatient Procedure: Limited Echo, Color Doppler and Cardiac Doppler Indications:    Pulmonary embolus  History:        Patient has no prior history of Echocardiogram examinations.                 Arrythmias:Tachycardia, Signs/Symptoms:Shortness of Breath and                 Altered Mental Status; Risk Factors:Dyslipidemia and Pulmonary                 embolus.  Sonographer:    Lavenia Atlas Referring Phys: [id number] Teddy Spike  Sonographer Comments: Looking for RV strain - Limited because HR elevated IMPRESSIONS  1. Left ventricular ejection fraction, by estimation, is >75%. The left ventricle has hyperdynamic function. The left ventricle has no regional wall motion abnormalities. Left ventricular diastolic parameters are indeterminate.  2. Subcostal views of RV appear normal contraction. Right ventricular systolic function is normal. The right ventricular size is normal.  3. The mitral valve is normal in structure. No evidence of mitral valve regurgitation. No evidence of mitral stenosis.  4. The aortic valve is normal  in structure. Aortic valve regurgitation is not visualized. No aortic stenosis is present.  5. The inferior vena cava is normal in size with greater than 50% respiratory variability, suggesting right atrial pressure of 3 mmHg. FINDINGS  Left Ventricle: Left ventricular ejection fraction, by estimation, is >75%. The left ventricle has hyperdynamic function. The left ventricle has no regional wall motion abnormalities. The left ventricular internal cavity size was normal in size. There is no left ventricular hypertrophy. Left ventricular diastolic parameters are indeterminate. Right Ventricle: Subcostal views of RV appear normal contraction. The right ventricular size is normal. No increase in right ventricular wall thickness. Right ventricular systolic function is normal. Left Atrium: Left atrial size was normal in size. Right Atrium: Right atrial size was normal in size. Pericardium: There is no evidence of pericardial effusion. Mitral Valve: The mitral valve is normal in structure. No evidence of mitral valve regurgitation. No evidence of mitral valve stenosis. Tricuspid Valve: The tricuspid valve is normal in structure. Tricuspid valve regurgitation is mild . No evidence of tricuspid stenosis. Aortic Valve: The aortic valve is normal in structure. Aortic valve regurgitation is not visualized. No aortic stenosis is present. Pulmonic Valve: The pulmonic valve was normal in structure. Pulmonic valve regurgitation is not visualized. No evidence of pulmonic stenosis. Aorta: The aortic root is normal in size and structure. Venous: The inferior vena cava is normal in size with greater than 50% respiratory variability, suggesting right atrial pressure of 3 mmHg. IAS/Shunts: No atrial level shunt detected by color flow Doppler.  LEFT VENTRICLE PLAX 2D LVIDd:         3.50 cm LVIDs:         1.90 cm LV PW:         1.20 cm LV IVS:        1.20 cm  Donato Schultz MD Electronically signed by Donato Schultz MD Signature Date/Time:  01/27/2021/4:11:56 PM    Final  Scheduled Meds: .  stroke: mapping our early stages of recovery book   Does not apply Once  . atenolol  25 mg Oral Daily  . atorvastatin  10 mg Oral Daily  . insulin aspart  0-6 Units Subcutaneous TID WC  . levothyroxine  50 mcg Oral QAC breakfast  . sertraline  50 mg Oral Daily    Continuous Infusions: . heparin 950 Units/hr (01/27/21 1305)     LOS: 0 days     Darlin Drop, MD [facility] Pager [phone]  If 7PM-7AM, please contact night-coverage [email or web address] Password [facility] 01/27/2021, 4:51 PM

## 2021-01-27 NOTE — Progress Notes (Signed)
PT Cancellation Note  Patient Details Name: Joyce Palmer MRN: [id number] DOB: [birth year]   Cancelled Treatment:    Reason Eval/Treat Not Completed: Medical issues which prohibited therapy. Per chart review, pt with subtherapeutic heparin level. Will check back on 2/19 if appropriate.   Domenick Bookbinder PT, DPT 01/27/21, 2:28 PM

## 2021-01-27 NOTE — Plan of Care (Signed)
Patient alert and oriented x 4. RR even and unlabored on 2L of nasal canula. Dentures removed by care partner so that they can soak. Encouraging patient to only eat the softer foods until dentures are finished soaking. Meal tray at bedside. Patient denying any pain. Heparin gtt going per MAR. No other needs identified. Will continue to monitor.  Problem: Health Behavior/Discharge Planning: Goal: Ability to manage health-related needs will improve Outcome: Progressing   Problem: Clinical Measurements: Goal: Ability to maintain clinical measurements within normal limits will improve Outcome: Progressing   Problem: Clinical Measurements: Goal: Will remain free from infection Outcome: Progressing   Problem: Education: Goal: Knowledge of General Education information will improve Description: Including pain rating scale, medication(s)/side effects and non-pharmacologic comfort measures Outcome: Progressing   Problem: Health Behavior/Discharge Planning: Goal: Ability to manage health-related needs will improve Outcome: Progressing   Problem: Clinical Measurements: Goal: Ability to maintain clinical measurements within normal limits will improve Outcome: Progressing Goal: Will remain free from infection Outcome: Progressing Goal: Diagnostic test results will improve Outcome: Progressing Goal: Respiratory complications will improve Outcome: Progressing Goal: Cardiovascular complication will be avoided Outcome: Progressing   Problem: Activity: Goal: Risk for activity intolerance will decrease Outcome: Progressing   Problem: Nutrition: Goal: Adequate nutrition will be maintained Outcome: Progressing   Problem: Coping: Goal: Level of anxiety will decrease Outcome: Progressing   Problem: Elimination: Goal: Will not experience complications related to bowel motility Outcome: Progressing Goal: Will not experience complications related to urinary retention Outcome: Progressing    Problem: Pain Managment: Goal: General experience of comfort will improve Outcome: Progressing   Problem: Safety: Goal: Ability to remain free from injury will improve Outcome: Progressing   Problem: Skin Integrity: Goal: Risk for impaired skin integrity will decrease Outcome: Progressing

## 2021-01-27 NOTE — Progress Notes (Signed)
  Echocardiogram 2D Echocardiogram has been performed.  Joyce Palmer 01/27/2021, 2:17 PM

## 2021-01-27 NOTE — Progress Notes (Signed)
ANTICOAGULATION CONSULT NOTE   Pharmacy Consult for IV heparin Indication: pulmonary embolus  Allergies  Allergen Reactions  . Ace Inhibitors Anaphylaxis and Swelling    Possible angioedema/anaphylaxis. Tongue swelling, throat closing sensation, shortness of breath. Admission to hospital on 06/03/2020   . Penicillins Hives, Itching and Rash  . Iron Diarrhea  . Rofecoxib Diarrhea and Other (See Comments)  . Nickel Rash    Patient Measurements: Height: 5\' 3"  (160 cm) Weight: 46.1 kg (101 lb 10.1 oz) IBW/kg (Calculated) : 52.4 Heparin Dosing Weight: 46 kg  Vital Signs: Temp: 98.3 F (36.8 C) (02/17 2007) Temp Source: Oral (02/17 2007) BP: 140/92 (02/18 0000) Pulse Rate: 115 (02/17 2007)  Labs: Recent Labs    01/25/21 1537 01/26/21 1217 01/27/21 0215  HGB 12.5 12.6 11.3*  HCT 37.7 39.0 35.9*  PLT 270 232 201  HEPARINUNFRC  --   --  <0.10*  CREATININE 1.14* 1.02*  --   TROPONINIHS 9  --   --     Estimated Creatinine Clearance: 34.7 mL/min (A) (by C-G formula based on SCr of 1.02 mg/dL (H)).   Medical History: Past Medical History:  Diagnosis Date  . Bronchitis   . COPD (chronic obstructive pulmonary disease) (HCC)   . Diabetes mellitus without complication (HCC)   . Hypertension   . Tachycardia     Medications:  Scheduled:  .  stroke: mapping our early stages of recovery book   Does not apply Once  . heparin  1,500 Units Intravenous Once  . insulin aspart  0-6 Units Subcutaneous TID WC   Infusions:  . heparin      Assessment: 75 yo female presented to ED with AMS with concern for possible TIA but MRI showed no acute changes. In addition, a new PE was found to now start IV heparin per Rx after CVA was ruled out. Baseline labs have been drawn.  01/27/21  Heparin level < 0.1 with heparin gtt @ 800 units/hr  Confirmed with RN no interruption in therapy and no line issues  No signs/symptoms bleeding noted  Hgb = 11.3, PLTC wnl  Goal of Therapy:   Heparin level 0.3-0.7 units/ml Monitor platelets by anticoagulation protocol: Yes   Plan:   Rebolus IV heparin 1500 unit IV x 1 then  Increase IV heparin rate to 950 units/hr  Check heparin level 8 hours after heparin gtt rate increased  Daily CBC and heparin level   01/29/21, PharmD 01/27/2021,3:22 AM

## 2021-01-27 NOTE — Progress Notes (Signed)
ANTICOAGULATION CONSULT NOTE - Follow Up Consult  Pharmacy Consult for heparin Indication: acute pulmonary embolus  Allergies  Allergen Reactions  . Ace Inhibitors Anaphylaxis and Swelling    Possible angioedema/anaphylaxis. Tongue swelling, throat closing sensation, shortness of breath. Admission to hospital on 06/03/2020   . Penicillins Hives, Itching and Rash  . Iron Diarrhea  . Rofecoxib Diarrhea and Other (See Comments)  . Nickel Rash    Patient Measurements: Height: 5\' 3"  (160 cm) Weight: 46.1 kg (101 lb 10.1 oz) IBW/kg (Calculated) : 52.4 Heparin Dosing Weight: 46 kg  Vital Signs: Temp: 98.5 F (36.9 C) (02/18 0930) Temp Source: Oral (02/18 0930) BP: 138/84 (02/18 0930) Pulse Rate: 105 (02/18 0930)  Labs: Recent Labs    01/25/21 1537 01/26/21 1217 01/27/21 0215 01/27/21 1148  HGB 12.5 12.6 11.3*  --   HCT 37.7 39.0 35.9*  --   PLT 270 232 201  --   HEPARINUNFRC  --   --  <0.10* 0.32  CREATININE 1.14* 1.02*  --   --   TROPONINIHS 9  --   --   --     Estimated Creatinine Clearance: 34.7 mL/min (A) (by C-G formula based on SCr of 1.02 mg/dL (H)).    Assessment: Patient is a 75 y.o F presented to the ED on 2/16 with c/o generalized weakness and AMS.  Chest CTA on 2/16 showed PE in the right middle lobe. LE doppler on 2/17 was negative for DVT. Brain MRI showed no acute findings. She's currently on heparin drip for acute PE.  Today, 01/27/2021: - heparin level is therapeutic at 0.32 after rate increased to 950 units/hr - hgb 11.3, plts 201K - no bleeding documented  Goal of Therapy:  Heparin level 0.3-0.7 units/ml Monitor platelets by anticoagulation protocol: Yes   Plan:  - continue heparin drip at 950 units/hr - recheck heparin level at 8p to ensure level is still therapeutic before changing to daily monitoring - monitor for s/sx bleeding  Vahe Pienta P 01/27/2021,12:58 PM

## 2021-01-28 DIAGNOSIS — G9341 Metabolic encephalopathy: Secondary | ICD-10-CM | POA: Diagnosis not present

## 2021-01-28 LAB — CBC
HCT: 36.6 % (ref 36.0–46.0)
Hemoglobin: 11.7 g/dL — ABNORMAL LOW (ref 12.0–15.0)
MCH: 29.5 pg (ref 26.0–34.0)
MCHC: 32 g/dL (ref 30.0–36.0)
MCV: 92.2 fL (ref 80.0–100.0)
Platelets: 231 10*3/uL (ref 150–400)
RBC: 3.97 MIL/uL (ref 3.87–5.11)
RDW: 18 % — ABNORMAL HIGH (ref 11.5–15.5)
WBC: 7.2 10*3/uL (ref 4.0–10.5)
nRBC: 0 % (ref 0.0–0.2)

## 2021-01-28 LAB — BASIC METABOLIC PANEL
Anion gap: 12 (ref 5–15)
BUN: 18 mg/dL (ref 8–23)
CO2: 21 mmol/L — ABNORMAL LOW (ref 22–32)
Calcium: 8.1 mg/dL — ABNORMAL LOW (ref 8.9–10.3)
Chloride: 100 mmol/L (ref 98–111)
Creatinine, Ser: 0.87 mg/dL (ref 0.44–1.00)
GFR, Estimated: >60 mL/min (ref >60)
Glucose, Bld: 95 mg/dL (ref 70–99)
Potassium: 3.4 mmol/L — ABNORMAL LOW (ref 3.5–5.1)
Sodium: 133 mmol/L — ABNORMAL LOW (ref 135–145)

## 2021-01-28 LAB — GLUCOSE, CAPILLARY
Glucose-Capillary: 94 mg/dL (ref 70–99)
Glucose-Capillary: 174 mg/dL — ABNORMAL HIGH (ref 70–99)
Glucose-Capillary: 112 mg/dL — ABNORMAL HIGH (ref 70–99)
Glucose-Capillary: 179 mg/dL — ABNORMAL HIGH (ref 70–99)

## 2021-01-28 LAB — HEPARIN LEVEL (UNFRACTIONATED): Heparin Unfractionated: 0.22 IU/mL — ABNORMAL LOW (ref 0.30–0.70)

## 2021-01-28 LAB — MAGNESIUM: Magnesium: 1.4 mg/dL — ABNORMAL LOW (ref 1.7–2.4)

## 2021-01-28 MED ORDER — POTASSIUM CHLORIDE CRYS ER 20 MEQ PO TBCR: 40.0000 meq | EXTENDED_RELEASE_TABLET | Freq: Once | ORAL | Status: AC

## 2021-01-28 MED ORDER — MAGNESIUM SULFATE 4 GM/100ML IV SOLN
4.0000 g | Freq: Once | INTRAVENOUS | Status: AC
  Administered 2021-01-28: 4 g via INTRAVENOUS
  Filled 2021-01-28: qty 100

## 2021-01-28 MED ORDER — APIXABAN 5 MG PO TABS
10.0000 mg | ORAL_TABLET | Freq: Two times a day (BID) | ORAL | Status: DC
  Administered 2021-01-28 – 2021-01-30 (×5): 10 mg via ORAL
  Filled 2021-01-28 (×3): qty 2
  Filled 2021-01-28: qty 4
  Filled 2021-01-28: qty 2

## 2021-01-28 MED ORDER — APIXABAN 5 MG PO TABS: 5.0000 mg | ORAL_TABLET | Freq: Two times a day (BID) | ORAL | Status: DC

## 2021-01-28 MED ORDER — POTASSIUM CHLORIDE CRYS ER 20 MEQ PO TBCR
40.0000 meq | EXTENDED_RELEASE_TABLET | Freq: Once | ORAL | Status: AC
  Administered 2021-01-28: 40 meq via ORAL
  Filled 2021-01-28: qty 2

## 2021-01-28 MED ORDER — HEPARIN (PORCINE) 25000 UT/250ML-% IV SOLN
1050.0000 [IU]/h | INTRAVENOUS | Status: DC
  Administered 2021-01-28: 1050 [IU]/h via INTRAVENOUS

## 2021-01-28 NOTE — Plan of Care (Signed)
  Problem: Education: Goal: Knowledge of General Education information will improve Description: Including pain rating scale, medication(s)/side effects and non-pharmacologic comfort measures Outcome: Progressing   Problem: Clinical Measurements: Goal: Diagnostic test results will improve Outcome: Progressing   

## 2021-01-28 NOTE — Progress Notes (Signed)
PROGRESS NOTE  Joyce Palmer IRS:[id number] DOB: May 01, 1946 DOA: 01/25/2021 PCP: Drucie Opitz, MD  HPI/Recap of past 24 hours:  Joyce Palmer is a 75 y.o. female with medical history significant of DM2, hyponatremia, HTN, HFpEF. Presenting with altered mental status.  CT head was negative for any acute intracranial abnormality.  Due to complaints of chest pain and nausea CT angio chest was done which came back positive for acute nonocclusive pulmonary embolus in the lobar branch of the right middle lobe.  Due to her persistent confusion MRI was obtained to rule out any underlying intracranial abnormality prior to starting anticoagulation.  Neurology was consulted for encephalopathy.  No evidence of acute intracranial findings on MRI brain.  She was started on IV heparin for acute pulmonary embolism.  01/28/21: Seen and examined at bedside.  She is alert and oriented x3.  She denies any chest pain or dyspnea at the time of this visit.  She was assessed by PT with recommendation for SNF.  Switched to Eliquis for treatment of PE.  Assessment/Plan: Active Problems:   Acute metabolic encephalopathy  Resolved acute metabolic encephalopathy likely in the setting of TIA or acute illness This morning she is alert and oriented x3. CT head and MRI brain negative for acute intracranial abnormalities UA negative for pyuria. Chest x-ray no acute cardiopulmonary disease.  Acute right-sided pulmonary embolism. Bilateral lower extremity Doppler ultrasound negative for DVT on 01/26/21. Started on heparin drip in the ED Switch to oral anticoagulation on 01/28/21, Eliquis.  Hypokalemia Serum potassium 3.4 Repleted orally Repeat level in the morning  Magnesium Magnesium 1.4 Repleted intravenously Repeat level in the morning  Resolved sinus tachycardia Presented with heart rate in the 140's on twelve-lead EKG, personally reviewed. Continue home atenolol 25 mg daily. 2D echo done on 01/27/2021 showed  LVEF greater than 75%.  Acute hypoxic respiratory failure secondary to acute pulmonary embolism No nausea supplementation at baseline Currently requiring 2 L to maintain O2 saturation greater than 92%. Will obtain home oxygen evaluation prior to DC.  Physical debility PT assessment recommended SNF TOC consulted to assist with SNF Fall precautions  Chronic mild hyponatremia Appears to be at her baseline with serum sodium 134. Asymptomatic.  Hypothyroidism Continue home levothyroxine  Chronic anxiety/depression Continue home Zoloft  Hyperlipidemia Resume home Lipitor.  Type 2 diabetes, well controlled Continue to hold off home po hypoglycemics HgA1C 7.7 on 01/26/21 Insulin sliding scale.   Code Status: Full code.  Family Communication: None at bedside.  Disposition Plan: Likely will discharge to home on 01/29/2021..   Consultants:  None.  Procedures:  2D echo.  Antimicrobials:  None.  DVT prophylaxis: Heparin drip.  Status is: Inpatient    Dispo: The patient is from: Home.               Anticipated d/c is to: Home.               Anticipated d/c date is: 01/29/2021.               Patient currently not stable for discharge due to ongoing tachycardia and acute hypoxemia.   Difficult to place patient N/A        Objective: Vitals:   01/28/21 1000 01/28/21 1100 01/28/21 1400 01/28/21 1620  BP: 131/74 124/87 121/72 (!) 142/98  Pulse: (!) 101   87  Resp: 16 (!) 26 15 18   Temp:    97.9 F (36.6 C)  TempSrc:    Oral  SpO2: 94%  100%  Weight:      Height:        Intake/Output Summary (Last 24 hours) at 01/28/2021 1724 Last data filed at 01/28/2021 1556 Gross per 24 hour  Intake 818.37 ml  Output 400 ml  Net 418.37 ml   Filed Weights   01/25/21 1638 01/26/21 1104  Weight: 48.1 kg 46.1 kg    Exam:  . General: 75 y.o. year-old female frail-appearing no acute distress.  Alert and oriented x3.   . Cardiovascular: Regular rate and rhythm no  rubs or gallops.   Marland Kitchen Respiratory: Clear to auscultation no wheeze no rales.   . Abdomen: Soft nontender normal bowel sounds present.   . Musculoskeletal: No lower extremity edema bilaterally.   . Skin: No ulcerative lesions no rashes noted. Marland Kitchen Psychiatry: Mood is appropriate for condition and setting.   Data Reviewed: CBC: Recent Labs  Lab 01/25/21 1537 01/26/21 1217 01/27/21 0215 01/28/21 0132  WBC 7.4 7.5 7.1 7.2  NEUTROABS  --  4.6  --   --   HGB 12.5 12.6 11.3* 11.7*  HCT 37.7 39.0 35.9* 36.6  MCV 88.1 92.0 92.1 92.2  PLT 270 232 201 231   Basic Metabolic Panel: Recent Labs  Lab 01/25/21 1537 01/26/21 1217 01/28/21 0807  NA 131* 134* 133*  K 4.3 3.5 3.4*  CL 95* 97* 100  CO2 25 25 21*  GLUCOSE 139* 89 95  BUN 21 16 18   CREATININE 1.14* 1.02* 0.87  CALCIUM 8.7* 8.5* 8.1*  MG  --  1.6* 1.4*  PHOS  --  3.2  --    GFR: Estimated Creatinine Clearance: 40.7 mL/min (by C-G formula based on SCr of 0.87 mg/dL). Liver Function Tests: Recent Labs  Lab 01/25/21 1537 01/26/21 1217  AST 20 17  ALT 13 15  ALKPHOS 50 53  BILITOT 1.1 1.2  PROT 7.9 7.4  ALBUMIN 3.2* 2.9*   No results for input(s): LIPASE, AMYLASE in the last 168 hours. Recent Labs  Lab 01/26/21 2016  AMMONIA 13   Coagulation Profile: No results for input(s): INR, PROTIME in the last 168 hours. Cardiac Enzymes: No results for input(s): CKTOTAL, CKMB, CKMBINDEX, TROPONINI in the last 168 hours. BNP (last 3 results) No results for input(s): PROBNP in the last 8760 hours. HbA1C: Recent Labs    01/26/21 1229  HGBA1C 7.7*   CBG: Recent Labs  Lab 01/27/21 1616 01/27/21 2211 01/28/21 0820 01/28/21 1153 01/28/21 1640  GLUCAP 127* 95 94 174* 112*   Lipid Profile: Recent Labs    01/27/21 0215  CHOL 93  HDL 33*  LDLCALC 45  TRIG 73  CHOLHDL 2.8   Thyroid Function Tests: Recent Labs    01/25/21 2211  TSH 2.968   Anemia Panel: Recent Labs    01/27/21 0215  VITAMINB12 912    Urine analysis:    Component Value Date/Time   COLORURINE YELLOW 01/25/2021 1630   APPEARANCEUR CLEAR 01/25/2021 1630   LABSPEC 1.005 01/25/2021 1630   PHURINE 7.5 01/25/2021 1630   GLUCOSEU NEGATIVE 01/25/2021 1630   HGBUR NEGATIVE 01/25/2021 1630   BILIRUBINUR NEGATIVE 01/25/2021 1630   KETONESUR NEGATIVE 01/25/2021 1630   PROTEINUR NEGATIVE 01/25/2021 1630   NITRITE NEGATIVE 01/25/2021 1630   LEUKOCYTESUR NEGATIVE 01/25/2021 1630   Sepsis Labs: @LABRCNTIP (procalcitonin:4,lacticidven:4)  ) Recent Results (from the past 240 hour(s))  Resp Panel by RT-PCR (Flu A&B, Covid) Nasopharyngeal Swab     Status: None   Collection Time: 01/25/21  4:32 PM   Specimen:  Nasopharyngeal Swab; Nasopharyngeal(NP) swabs in vial transport medium  Result Value Ref Range Status   SARS Coronavirus 2 by RT PCR NEGATIVE NEGATIVE Final    Comment: (NOTE) SARS-CoV-2 target nucleic acids are NOT DETECTED.  The SARS-CoV-2 RNA is generally detectable in upper respiratory specimens during the acute phase of infection. The lowest concentration of SARS-CoV-2 viral copies this assay can detect is 138 copies/mL. A negative result does not preclude SARS-Cov-2 infection and should not be used as the sole basis for treatment or other patient management decisions. A negative result may occur with  improper specimen collection/handling, submission of specimen other than nasopharyngeal swab, presence of viral mutation(s) within the areas targeted by this assay, and inadequate number of viral copies(<138 copies/mL). A negative result must be combined with clinical observations, patient history, and epidemiological information. The expected result is Negative.  Fact Sheet for Patients:  BloggerCourse.com  Fact Sheet for Healthcare Providers:  SeriousBroker.it  This test is no t yet approved or cleared by the [place] FDA and  has been authorized for  detection and/or diagnosis of SARS-CoV-2 by FDA under an Emergency Use Authorization (EUA). This EUA will remain  in effect (meaning this test can be used) for the duration of the COVID-19 declaration under Section 564(b)(1) of the Act, 21 U.S.C.section 360bbb-3(b)(1), unless the authorization is terminated  or revoked sooner.       Influenza A by PCR NEGATIVE NEGATIVE Final   Influenza B by PCR NEGATIVE NEGATIVE Final    Comment: (NOTE) The Xpert Xpress SARS-CoV-2/FLU/RSV plus assay is intended as an aid in the diagnosis of influenza from Nasopharyngeal swab specimens and should not be used as a sole basis for treatment. Nasal washings and aspirates are unacceptable for Xpert Xpress SARS-CoV-2/FLU/RSV testing.  Fact Sheet for Patients: BloggerCourse.com  Fact Sheet for Healthcare Providers: SeriousBroker.it  This test is not yet approved or cleared by the [place] FDA and has been authorized for detection and/or diagnosis of SARS-CoV-2 by FDA under an Emergency Use Authorization (EUA). This EUA will remain in effect (meaning this test can be used) for the duration of the COVID-19 declaration under Section 564(b)(1) of the Act, 21 U.S.C. section 360bbb-3(b)(1), unless the authorization is terminated or revoked.  Performed at [facility], [address] [place], [place] [address]       Studies: No results found.  Scheduled Meds: .  stroke: mapping our early stages of recovery book   Does not apply Once  . apixaban  10 mg Oral BID   Followed by  . [START ON 02/04/2021] apixaban  5 mg Oral BID  . atenolol  25 mg Oral Daily  . atorvastatin  10 mg Oral Daily  . insulin aspart  0-6 Units Subcutaneous TID WC  . levothyroxine  50 mcg Oral QAC breakfast  . sertraline  50 mg Oral Daily    Continuous Infusions: . magnesium sulfate bolus IVPB 4 g (01/28/21 1556)     LOS: 1 day     Darlin Drop,  MD [facility] Pager [phone]  If 7PM-7AM, please contact night-coverage [email or web address] Password [facility] 01/28/2021, 5:24 PM

## 2021-01-28 NOTE — Progress Notes (Signed)
ANTICOAGULATION CONSULT NOTE - Follow Up Consult  Pharmacy Consult for heparin Indication: acute pulmonary embolus  Allergies  Allergen Reactions  . Ace Inhibitors Anaphylaxis and Swelling    Possible angioedema/anaphylaxis. Tongue swelling, throat closing sensation, shortness of breath. Admission to hospital on 06/03/2020   . Penicillins Hives, Itching and Rash  . Iron Diarrhea  . Rofecoxib Diarrhea and Other (See Comments)  . Nickel Rash    Patient Measurements: Height: 5\' 3"  (160 cm) Weight: 46.1 kg (101 lb 10.1 oz) IBW/kg (Calculated) : 52.4 Heparin Dosing Weight: 46 kg  Vital Signs: Temp: 99.8 F (37.7 C) (02/18 2200) Temp Source: Oral (02/18 2200) BP: 142/92 (02/18 1901) Pulse Rate: 91 (02/18 2200)  Labs: Recent Labs    01/25/21 1537 01/26/21 1217 01/26/21 1217 01/27/21 0215 01/27/21 1148 01/27/21 2010 01/28/21 0132  HGB 12.5 12.6  --  11.3*  --   --  11.7*  HCT 37.7 39.0  --  35.9*  --   --  36.6  PLT 270 232  --  201  --   --  231  HEPARINUNFRC  --   --    < > <0.10* 0.32 0.32 0.22*  CREATININE 1.14* 1.02*  --   --   --   --   --   TROPONINIHS 9  --   --   --   --   --   --    < > = values in this interval not displayed.    Estimated Creatinine Clearance: 34.7 mL/min (A) (by C-G formula based on SCr of 1.02 mg/dL (H)).    Assessment: Patient is a 75 y.o F presented to the ED on 2/16 with c/o generalized weakness and AMS.  Chest CTA on 2/16 showed PE in the right middle lobe. LE doppler on 2/17 was negative for DVT. Brain MRI showed no acute findings. She's currently on heparin drip for acute PE.  Today, 01/28/2021: - heparin level is subtherapeutic at 0.22 @ rate of 950 units/hr - hgb 11.7, plts 231K - no bleeding documented  Goal of Therapy:  Heparin level 0.3-0.7 units/ml Monitor platelets by anticoagulation protocol: Yes   Plan:  - increase heparin drip to 1050 units/hr - recheck heparin level 8 hr after rate increase  - monitor for s/sx  bleeding  01/30/2021, PharmD 01/28/2021,4:06 AM

## 2021-01-28 NOTE — Progress Notes (Signed)
ANTICOAGULATION CONSULT NOTE - Follow Up Consult  Pharmacy Consult for heparin Indication: acute pulmonary embolus  Allergies  Allergen Reactions  . Ace Inhibitors Anaphylaxis and Swelling    Possible angioedema/anaphylaxis. Tongue swelling, throat closing sensation, shortness of breath. Admission to hospital on 06/03/2020   . Penicillins Hives, Itching and Rash  . Iron Diarrhea  . Rofecoxib Diarrhea and Other (See Comments)  . Nickel Rash    Patient Measurements: Height: 5\' 3"  (160 cm) Weight: 46.1 kg (101 lb 10.1 oz) IBW/kg (Calculated) : 52.4 Heparin Dosing Weight: 46 kg  Vital Signs: Temp: 99.7 F (37.6 C) (02/19 0400) Temp Source: Oral (02/19 0400) Pulse Rate: 91 (02/18 2200)  Labs: Recent Labs    01/25/21 1537 01/26/21 1217 01/26/21 1217 01/27/21 0215 01/27/21 1148 01/27/21 2010 01/28/21 0132  HGB 12.5 12.6  --  11.3*  --   --  11.7*  HCT 37.7 39.0  --  35.9*  --   --  36.6  PLT 270 232  --  201  --   --  231  HEPARINUNFRC  --   --    < > <0.10* 0.32 0.32 0.22*  CREATININE 1.14* 1.02*  --   --   --   --   --   TROPONINIHS 9  --   --   --   --   --   --    < > = values in this interval not displayed.    Estimated Creatinine Clearance: 34.7 mL/min (A) (by C-G formula based on SCr of 1.02 mg/dL (H)).    Assessment: Patient is a 75 y.o F presented to the ED on 2/16 with c/o generalized weakness and AMS.  Chest CTA on 2/16 showed PE in the right middle lobe. LE doppler on 2/17 was negative for DVT. Brain MRI showed no acute findings. She's currently on heparin drip for acute PE.  Today, 01/28/2021: - heparin level is subtherapeutic at 0.22 @ rate of 950 units/hr - hgb 11.7, plts 231K - no bleeding documented  To transition to apixaban today - CrCl > 25 ml/min - CBC ok - No drug-drug interactions noted  Goal of Therapy:  Heparin level 0.3-0.7 units/ml Monitor platelets by anticoagulation protocol: Yes   Plan:  - DC heparin - Begin Apixaban 10mg  PO  bid x 7 days, then 5mg  PO bid thereafter - Will provide DOAC education and 30-day free coupon voucher prior to discharge  01/30/2021, PharmD, BCPS Pharmacy: [phone] 01/28/2021,7:26 AM

## 2021-01-28 NOTE — Evaluation (Signed)
Physical Therapy Evaluation Patient Details Name: Joyce Palmer MRN: [id number] DOB: 02/27/1946 Today's Date: 01/28/2021   History of Present Illness  Joyce Palmer is a 75 y.o. female with medical history significant of HTN, diabetes, COPD with current c/o altered mental status, weakness; found to have PE.  Clinical Impression  Pt admitted with above diagnosis. PTA, pt independent with SPC and providing housecleaning and cooking to brother with dementia who lives with her. Pt states three separate sets of stairs prior to entering home, none with handrails, previously no difficulty with navigating. Pt reports several falls over the past 6 months, unable to quantify. Pt currently requiring min G with bed mobility and min A with transfers and min-mod A with ambulation using RW due to R knee pain. Pt with R knee buckling and increased pain, limiting ambulation- notified RN. Pt a&ox4, but repeats same story multiple times throughout eval and changes answers when asked questions multiple times. Therapist educated pt on benefit of SNF due to repeated falls, weakness, and current assist level required, but pt consistently reports wanting to return home. Recommending SNF, but if continues to refuse recommend HHPT. Pt currently with functional limitations due to the deficits listed below (see PT Problem List). Pt will benefit from skilled PT to increase their independence and safety with mobility to allow discharge to the venue listed below.       Follow Up Recommendations SNF    Equipment Recommendations  Rolling walker with 5" wheels;3in1 (PT)    Recommendations for Other Services       Precautions / Restrictions Precautions Precautions: Fall Precaution Comments: R knee buckling Restrictions Weight Bearing Restrictions: No      Mobility  Bed Mobility Overal bed mobility: Needs Assistance Bed Mobility: Supine to Sit;Sit to Supine  Supine to sit: Min guard Sit to supine: Min guard    General bed mobility comments: increased time, use of bedrail, increased pain with R knee mobility    Transfers Overall transfer level: Needs assistance Equipment used: Rolling walker (2 wheeled) Transfers: Sit to/from [employer] Sit to Stand: Min assist Stand pivot transfers: Min assist  General transfer comment: min A to power up from sitting with BUE assisting, assist to steady initially and improves with time, min A to pivot over to [facility] with cues for hand placement  Ambulation/Gait Ambulation/Gait assistance: Min assist;Mod assist   Assistive device: Rolling walker (2 wheeled) Gait Pattern/deviations: Step-to pattern;Decreased weight shift to right;Trunk flexed Gait velocity: decreased  General Gait Details: pt limited to 4-5 steps at bedside with R knee buckling noted with weight acceptance, cued for shorter steps and UE weightbearing on RW to assist, no falls  Stairs            Wheelchair Mobility    Modified Rankin (Stroke Patients Only)       Balance Overall balance assessment: Needs assistance;History of Falls Sitting-balance support: Feet supported Sitting balance-Leahy Scale: Good Sitting balance - Comments: seated EOB   Standing balance support: During functional activity Standing balance-Leahy Scale: Poor Standing balance comment: UE support due to R knee instability                             Pertinent Vitals/Pain Pain Assessment: Faces Faces Pain Scale: Hurts whole lot Pain Location: R knee Pain Descriptors / Indicators: Grimacing;Guarding Pain Intervention(s): Limited activity within patient's tolerance;Monitored during session;Repositioned (notified RN)    Home Living Family/patient expects to be  discharged to:: Private residence Living Arrangements: Other relatives (brother and nephew) Available Help at Discharge: Family Type of Home: House Home Access: Stairs to enter Entrance Stairs-Rails: None Entrance  Stairs-Number of Steps: 1, 3, and 5 spread out, no handrails Home Layout: One level Home Equipment: Cane - single point Additional Comments: Pt reports brother with dementia lives with her, he doesn't require physical assist but pt has to cook and clean for him.    Prior Function Level of Independence: Independent with assistive device(s)         Comments: Pt reports independent with bathing, dressing, cleaning and cooking, ambulates in home with [facility] and hold onto shopping cart in grocery store.     Hand Dominance   Dominant Hand: Left    Extremity/Trunk Assessment   Upper Extremity Assessment Upper Extremity Assessment: Defer to OT evaluation    Lower Extremity Assessment Lower Extremity Assessment: RLE deficits/detail;LLE deficits/detail RLE Deficits / Details: knee AROM ~50% and strength 2+/5, hip and ankle 3/5 LLE Deficits / Details: knee AROM ~75% and strength 2+/5, hip and ankle 3/5    Cervical / Trunk Assessment Cervical / Trunk Assessment: Normal  Communication   Communication: No difficulties  Cognition Arousal/Alertness: Awake/alert Behavior During Therapy: WFL for tasks assessed/performed Overall Cognitive Status: No family/caregiver present to determine baseline cognitive functioning                                 General Comments: Pt a&o x4, but pt repetitive with story telling same story 3-4x during eval, answers change when therapist asks same question multiple times      General Comments      Exercises     Assessment/Plan    PT Assessment Patient needs continued PT services  PT Problem List Decreased strength;Decreased range of motion;Decreased activity tolerance;Decreased balance;Decreased mobility;Decreased knowledge of use of DME;Decreased safety awareness;Pain       PT Treatment Interventions DME instruction;Gait training;Functional mobility training;Therapeutic activities;Therapeutic exercise;Balance training;Patient/family  education    PT Goals (Current goals can be found in the Care Plan section)  Acute Rehab PT Goals Patient Stated Goal: "go home" PT Goal Formulation: With patient Time For Goal Achievement: 02/11/21 Potential to Achieve Goals: Good    Frequency Min 3X/week   Barriers to discharge        Co-evaluation PT/OT/SLP Co-Evaluation/Treatment: Yes Reason for Co-Treatment: For patient/therapist safety;To address functional/ADL transfers PT goals addressed during session: Mobility/safety with mobility;Balance;Proper use of DME         AM-PAC PT "6 Clicks" Mobility  Outcome Measure Help needed turning from your back to your side while in a flat bed without using bedrails?: None Help needed moving from lying on your back to sitting on the side of a flat bed without using bedrails?: A Little Help needed moving to and from a bed to a chair (including a wheelchair)?: A Little Help needed standing up from a chair using your arms (e.g., wheelchair or bedside chair)?: A Little Help needed to walk in hospital room?: A Lot Help needed climbing 3-5 steps with a railing? : A Lot 6 Click Score: 17    End of Session Equipment Utilized During Treatment: Gait belt Activity Tolerance: Patient limited by pain Patient left: in bed;with call bell/phone within reach;with bed alarm set;Other (comment) (pharmacist in room) Nurse Communication: Mobility status;Other (comment) (R knee pain possibly needing x-ray due to recent fall) PT Visit Diagnosis: Unsteadiness on feet (  R26.81);Other abnormalities of gait and mobility (R26.89);Repeated falls (R29.6);Muscle weakness (generalized) (M62.81);Pain Pain - Right/Left: Right Pain - part of body: Knee    Time: 0100-7121 PT Time Calculation (min) (ACUTE ONLY): 35 min   Charges:   PT Evaluation $PT Eval Moderate Complexity: 1 Mod           Tori Gerarda Conklin PT, DPT 01/28/21, 3:20 PM

## 2021-01-28 NOTE — Progress Notes (Signed)
Pt has not had BM since 2/18 0400, denies abd pain, afebrile, labs noted in CHL. MD updated. Enteric Precautions discontinued, education completed to niece and patient. Will continue to monitor patient. SRP, RN
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
## 2021-01-29 ENCOUNTER — Inpatient Hospital Stay (HOSPITAL_COMMUNITY): Payer: Medicare Other

## 2021-01-29 DIAGNOSIS — G9341 Metabolic encephalopathy: Secondary | ICD-10-CM | POA: Diagnosis not present

## 2021-01-29 LAB — BASIC METABOLIC PANEL
Anion gap: 6 (ref 5–15)
BUN: 24 mg/dL — ABNORMAL HIGH (ref 8–23)
CO2: 23 mmol/L (ref 22–32)
Calcium: 8.1 mg/dL — ABNORMAL LOW (ref 8.9–10.3)
Chloride: 98 mmol/L (ref 98–111)
Creatinine, Ser: 0.78 mg/dL (ref 0.44–1.00)
GFR, Estimated: >60 mL/min (ref >60)
Glucose, Bld: 124 mg/dL — ABNORMAL HIGH (ref 70–99)
Potassium: 4.7 mmol/L (ref 3.5–5.1)
Sodium: 127 mmol/L — ABNORMAL LOW (ref 135–145)

## 2021-01-29 LAB — CBC
HCT: 33.7 % — ABNORMAL LOW (ref 36.0–46.0)
Hemoglobin: 11 g/dL — ABNORMAL LOW (ref 12.0–15.0)
MCH: 29.3 pg (ref 26.0–34.0)
MCHC: 32.6 g/dL (ref 30.0–36.0)
MCV: 89.6 fL (ref 80.0–100.0)
Platelets: 223 10*3/uL (ref 150–400)
RBC: 3.76 MIL/uL — ABNORMAL LOW (ref 3.87–5.11)
RDW: 17.3 % — ABNORMAL HIGH (ref 11.5–15.5)
WBC: 8.5 10*3/uL (ref 4.0–10.5)
nRBC: 0 % (ref 0.0–0.2)

## 2021-01-29 LAB — GLUCOSE, CAPILLARY
Glucose-Capillary: 134 mg/dL — ABNORMAL HIGH (ref 70–99)
Glucose-Capillary: 162 mg/dL — ABNORMAL HIGH (ref 70–99)
Glucose-Capillary: 131 mg/dL — ABNORMAL HIGH (ref 70–99)
Glucose-Capillary: 132 mg/dL — ABNORMAL HIGH (ref 70–99)

## 2021-01-29 LAB — MAGNESIUM: Magnesium: 2.2 mg/dL (ref 1.7–2.4)

## 2021-01-29 LAB — SODIUM: Sodium: 125 mmol/L — ABNORMAL LOW (ref 135–145)

## 2021-01-29 NOTE — Progress Notes (Signed)
PROGRESS NOTE  Joyce Palmer OAC:[id number] DOB: November 27, 1946 DOA: 01/25/2021 PCP: Drucie Opitz, MD  HPI/Recap of past 24 hours:  Joyce Palmer is a 75 y.o. female with medical history significant of DM2, hyponatremia, HTN, HFpEF. Presenting with altered mental status.  CT head was negative for any acute intracranial abnormality.  Due to complaints of chest pain and nausea CT angio chest was done which came back positive for acute nonocclusive pulmonary embolus in the lobar branch of the right middle lobe.  Due to her persistent confusion MRI was obtained to rule out any underlying intracranial abnormality prior to starting anticoagulation.  Neurology was consulted for encephalopathy.  No evidence of acute intracranial findings on MRI brain.  She was started on IV heparin for acute pulmonary embolism.  Switched to Eliquis for treatment of PE on 01/28/21.  She was assessed by PT with recommendation for SNF.      01/29/21: She was seen and examined at bedside.  She reports right knee pain.  States it is chronic.  Noted R knee edema, portable x-ray showed suspected small effusion with no other abnormalities, no fractures or dislocations, we will continue analgesics.  Assessment/Plan: Active Problems:   Acute metabolic encephalopathy  Resolved acute metabolic encephalopathy likely in the setting of TIA or acute illness This morning she is alert and oriented x3. CT head and MRI brain negative for acute intracranial abnormalities UA negative for pyuria. Chest x-ray no acute cardiopulmonary disease.  Acute right-sided pulmonary embolism. Bilateral lower extremity Doppler ultrasound negative for DVT on 01/26/21. Started on heparin drip in the ED Switched to oral anticoagulation on 01/28/21, Eliquis.  Worsening acute on chronic mild hyponatremia Serum sodium downtrending 127 from 133 Repeat serum sodium this afternoon and in the morning.  Chronic right knee pain Suspected small right knee  effusion with no other abnormalities. Continue analgesics  Resolved post repletion: Hypokalemia Serum potassium 3.4>> 4.7.  Resolved post repletion: Hypomagnesemia. Magnesium 1.4>>2.2  Resolved sinus tachycardia Presented with heart rate in the 140's on twelve-lead EKG, personally reviewed. Continue home atenolol 25 mg daily. 2D echo done on 01/27/2021 showed LVEF greater than 75%.  Resolved acute hypoxic respiratory failure secondary to acute pulmonary embolism Not on oxygen supplementation at baseline.   Wean off oxygen supplementation as tolerated. Maintain O2 saturation greater than 90%.  Physical debility PT assessment recommended SNF TOC consulted to assist with SNF Patient has declined SNF and she wants to go home Continue fall precautions  Hypothyroidism TSH 2.96 Continue home levothyroxine  Chronic anxiety/depression Continue home Zoloft  Hyperlipidemia Resume home Lipitor.  Type 2 diabetes, well controlled Continue to hold off home po hypoglycemics HgA1C 7.7 on 01/26/21 Insulin sliding scale.   Code Status: Full code.  Family Communication: None at bedside.  Disposition Plan: Likely will discharge to home on 01/30/2021..   Consultants:  None.  Procedures:  2D echo.  Antimicrobials:  None.  DVT prophylaxis: Heparin drip.  Status is: Inpatient    Dispo: The patient is from: Home.               Anticipated d/c is to: Home.               Anticipated d/c date is: 01/30/2021.               Patient currently not stable for discharge due to management of hyponatremia.   Difficult to place patient N/A        Objective: Vitals:   01/29/21 0500  01/29/21 1200 01/29/21 1300 01/29/21 1400  BP: 128/74 (!) 138/112 (!) 138/118 116/68  Pulse:  94    Resp:  (!) 27 (!) 25 20  Temp: 98.7 F (37.1 C) 99.1 F (37.3 C)    TempSrc: Oral Oral    SpO2:  98%    Weight:      Height:        Intake/Output Summary (Last 24 hours) at 01/29/2021  1617 Last data filed at 01/29/2021 1300 Gross per 24 hour  Intake 600 ml  Output 1100 ml  Net -500 ml   Filed Weights   01/25/21 1638 01/26/21 1104  Weight: 48.1 kg 46.1 kg    Exam:  . General: 75 y.o. year-old female frail-appearing no acute distress.  Alert and oriented times 3.   . Cardiovascular: Regular rate and rhythm no rubs or gallops. Marland Kitchen Respiratory: Clear to auscultation no wheezes no rales. . Abdomen: Soft nontender normal bowel sounds present. . Musculoskeletal: No lower extremity edema bilaterally.   . Skin: No ulcerative lesions noted. Marland Kitchen Psychiatry: Mood is appropriate for condition and setting.   Data Reviewed: CBC: Recent Labs  Lab 01/25/21 1537 01/26/21 1217 01/27/21 0215 01/28/21 0132 01/29/21 0429  WBC 7.4 7.5 7.1 7.2 8.5  NEUTROABS  --  4.6  --   --   --   HGB 12.5 12.6 11.3* 11.7* 11.0*  HCT 37.7 39.0 35.9* 36.6 33.7*  MCV 88.1 92.0 92.1 92.2 89.6  PLT 270 232 201 231 223   Basic Metabolic Panel: Recent Labs  Lab 01/25/21 1537 01/26/21 1217 01/28/21 0807 01/29/21 0537  NA 131* 134* 133* 127*  K 4.3 3.5 3.4* 4.7  CL 95* 97* 100 98  CO2 25 25 21* 23  GLUCOSE 139* 89 95 124*  BUN 21 16 18  24*  CREATININE 1.14* 1.02* 0.87 0.78  CALCIUM 8.7* 8.5* 8.1* 8.1*  MG  --  1.6* 1.4* 2.2  PHOS  --  3.2  --   --    GFR: Estimated Creatinine Clearance: 44.2 mL/min (by C-G formula based on SCr of 0.78 mg/dL). Liver Function Tests: Recent Labs  Lab 01/25/21 1537 01/26/21 1217  AST 20 17  ALT 13 15  ALKPHOS 50 53  BILITOT 1.1 1.2  PROT 7.9 7.4  ALBUMIN 3.2* 2.9*   No results for input(s): LIPASE, AMYLASE in the last 168 hours. Recent Labs  Lab 01/26/21 2016  AMMONIA 13   Coagulation Profile: No results for input(s): INR, PROTIME in the last 168 hours. Cardiac Enzymes: No results for input(s): CKTOTAL, CKMB, CKMBINDEX, TROPONINI in the last 168 hours. BNP (last 3 results) No results for input(s): PROBNP in the last 8760  hours. HbA1C: No results for input(s): HGBA1C in the last 72 hours. CBG: Recent Labs  Lab 01/28/21 1153 01/28/21 1640 01/28/21 2136 01/29/21 0744 01/29/21 1138  GLUCAP 174* 112* 179* 134* 162*   Lipid Profile: Recent Labs    01/27/21 0215  CHOL 93  HDL 33*  LDLCALC 45  TRIG 73  CHOLHDL 2.8   Thyroid Function Tests: No results for input(s): TSH, T4TOTAL, FREET4, T3FREE, THYROIDAB in the last 72 hours. Anemia Panel: Recent Labs    01/27/21 0215  VITAMINB12 912   Urine analysis:    Component Value Date/Time   COLORURINE YELLOW 01/25/2021 1630   APPEARANCEUR CLEAR 01/25/2021 1630   LABSPEC 1.005 01/25/2021 1630   PHURINE 7.5 01/25/2021 1630   GLUCOSEU NEGATIVE 01/25/2021 1630   HGBUR NEGATIVE 01/25/2021 1630   BILIRUBINUR  NEGATIVE 01/25/2021 1630   KETONESUR NEGATIVE 01/25/2021 1630   PROTEINUR NEGATIVE 01/25/2021 1630   NITRITE NEGATIVE 01/25/2021 1630   LEUKOCYTESUR NEGATIVE 01/25/2021 1630   Sepsis Labs: @LABRCNTIP (procalcitonin:4,lacticidven:4)  ) Recent Results (from the past 240 hour(s))  Resp Panel by RT-PCR (Flu A&B, Covid) Nasopharyngeal Swab     Status: None   Collection Time: 01/25/21  4:32 PM   Specimen: Nasopharyngeal Swab; Nasopharyngeal(NP) swabs in vial transport medium  Result Value Ref Range Status   SARS Coronavirus 2 by RT PCR NEGATIVE NEGATIVE Final    Comment: (NOTE) SARS-CoV-2 target nucleic acids are NOT DETECTED.  The SARS-CoV-2 RNA is generally detectable in upper respiratory specimens during the acute phase of infection. The lowest concentration of SARS-CoV-2 viral copies this assay can detect is 138 copies/mL. A negative result does not preclude SARS-Cov-2 infection and should not be used as the sole basis for treatment or other patient management decisions. A negative result may occur with  improper specimen collection/handling, submission of specimen other than nasopharyngeal swab, presence of viral mutation(s) within  the areas targeted by this assay, and inadequate number of viral copies(<138 copies/mL). A negative result must be combined with clinical observations, patient history, and epidemiological information. The expected result is Negative.  Fact Sheet for Patients:  01/27/21  Fact Sheet for Healthcare Providers:  BloggerCourse.com  This test is no t yet approved or cleared by the [employer]ious[employer].[place] FDA and  has been authorized for detection and/or diagnosis of SARS-CoV-2 by FDA under an Emergency Use Authorization (EUA). This EUA will remain  in effect (meaning this test can be used) for the duration of the COVID-19 declaration under Section 564(b)(1) of the Act, 21 U.S.C.section 360bbb-3(b)(1), unless the authorization is terminated  or revoked sooner.       Influenza A by PCR NEGATIVE NEGATIVE Final   Influenza B by PCR NEGATIVE NEGATIVE Final    Comment: (NOTE) The Xpert Xpress SARS-CoV-2/FLU/RSV plus assay is intended as an aid in the diagnosis of influenza from Nasopharyngeal swab specimens and should not be used as a sole basis for treatment. Nasal washings and aspirates are unacceptable for Xpert Xpress SARS-CoV-2/FLU/RSV testing.  Fact Sheet for Patients: [place]  Fact Sheet for Healthcare Providers: BloggerCourse.com  This test is not yet approved or cleared by the Serious[employer]ker.it FDA and has been authorized for detection and/or diagnosis of SARS-CoV-2 by FDA under an Emergency Use Authorization (EUA). This EUA will remain in effect (meaning this test can be used) for the duration of the COVID-19 declaration under Section 564(b)(1) of the Act, 21 U.S.C. section 360bbb-3(b)(1), unless the authorization is terminated or revoked.  Performed at [facility], [address], [place], [place]       Studies: DG Knee Right Port  Result  Date: 01/29/2021 CLINICAL DATA:  Patient fell on knee multiple years ago.  Pain. EXAM: PORTABLE RIGHT KNEE - 1-2 VIEW COMPARISON:  None. FINDINGS: Vascular calcifications. No fractures or dislocations. Suspected small effusion. No other abnormalities. IMPRESSION: Suspected small effusion.  No other abnormalities. Electronically Signed   By: 01/31/2021 III M.D   On: 01/29/2021 12:24    Scheduled Meds: .  stroke: mapping our early stages of recovery book   Does not apply Once  . apixaban  10 mg Oral BID   Followed by  . [START ON 02/04/2021] apixaban  5 mg Oral BID  . atenolol  25 mg Oral Daily  . atorvastatin  10 mg Oral  Daily  . insulin aspart  0-6 Units Subcutaneous TID WC  . levothyroxine  50 mcg Oral QAC breakfast  . sertraline  50 mg Oral Daily    Continuous Infusions:    LOS: 2 days     Darlin Droparole N Euclid Cassetta, MD [facility] Pager [phone]  If 7PM-7AM, please contact night-coverage [email or web address] Password [email or web address] 01/29/2021, 4:17 PM

## 2021-01-30 DIAGNOSIS — G9341 Metabolic encephalopathy: Secondary | ICD-10-CM | POA: Diagnosis not present

## 2021-01-30 LAB — CBC
HCT: 31.6 % — ABNORMAL LOW (ref 36.0–46.0)
Hemoglobin: 10.3 g/dL — ABNORMAL LOW (ref 12.0–15.0)
MCH: 29.3 pg (ref 26.0–34.0)
MCHC: 32.6 g/dL (ref 30.0–36.0)
MCV: 90 fL (ref 80.0–100.0)
Platelets: 223 10*3/uL (ref 150–400)
RBC: 3.51 MIL/uL — ABNORMAL LOW (ref 3.87–5.11)
RDW: 17.2 % — ABNORMAL HIGH (ref 11.5–15.5)
WBC: 8.8 10*3/uL (ref 4.0–10.5)
nRBC: 0 % (ref 0.0–0.2)

## 2021-01-30 LAB — BASIC METABOLIC PANEL
Anion gap: 6 (ref 5–15)
BUN: 35 mg/dL — ABNORMAL HIGH (ref 8–23)
CO2: 22 mmol/L (ref 22–32)
Calcium: 8.3 mg/dL — ABNORMAL LOW (ref 8.9–10.3)
Chloride: 97 mmol/L — ABNORMAL LOW (ref 98–111)
Creatinine, Ser: 0.81 mg/dL (ref 0.44–1.00)
GFR, Estimated: >60 mL/min (ref >60)
Glucose, Bld: 126 mg/dL — ABNORMAL HIGH (ref 70–99)
Potassium: 4.6 mmol/L (ref 3.5–5.1)
Sodium: 125 mmol/L — ABNORMAL LOW (ref 135–145)

## 2021-01-30 LAB — MAGNESIUM: Magnesium: 1.8 mg/dL (ref 1.7–2.4)

## 2021-01-30 LAB — GLUCOSE, CAPILLARY
Glucose-Capillary: 124 mg/dL — ABNORMAL HIGH (ref 70–99)
Glucose-Capillary: 126 mg/dL — ABNORMAL HIGH (ref 70–99)

## 2021-01-30 LAB — SODIUM: Sodium: 127 mmol/L — ABNORMAL LOW (ref 135–145)

## 2021-01-30 MED ORDER — SODIUM CHLORIDE 1 G PO TABS: 1.0000 g | ORAL_TABLET | Freq: Every day | ORAL | Status: DC

## 2021-01-30 MED ORDER — SODIUM CHLORIDE 1 G PO TABS: 1.0000 g | ORAL_TABLET | Freq: Every day | ORAL | 0 refills | Status: AC

## 2021-01-30 MED ORDER — SODIUM CHLORIDE 1 G PO TABS
2.0000 g | ORAL_TABLET | Freq: Once | ORAL | Status: AC
  Administered 2021-01-30: 2 g via ORAL
  Filled 2021-01-30: qty 2

## 2021-01-30 NOTE — Care Management Important Message (Signed)
Important Message  Patient Details IM Letter given to the Patient. Name: KYRIE BUN MRN: [id number] Date of Birth: 1946-05-02   Medicare Important Message Given:  Yes     Caren Macadam 01/30/2021, 12:12 PM

## 2021-01-30 NOTE — Discharge Summary (Addendum)
Discharge Summary  Joyce Palmer IPJ:[id number] DOB: 1946/06/01  PCP: Teresita Madura, MD  Admit date: 01/25/2021 Discharge date: 01/30/2021  Time spent: 35 minutes  Recommendations for Outpatient Follow-up:  1. Follow-up with your PCP. 2. Continue PT OT with assistance and fall precautions. 3. Take your medications as prescribed. 4. Repeat BMP at your PCPs office on 02/03/2021.  Discharge Diagnoses:  Active Hospital Problems   Diagnosis Date Noted  . Acute metabolic encephalopathy [id number]/41/9379    Resolved Hospital Problems  No resolved problems to display.    Discharge Condition: Stable.  Diet recommendation: Resume previous diet.  Vitals:   01/30/21 0440 01/30/21 0800  BP: 113/69 115/81  Pulse: 85   Resp:  12  Temp: 98.9 F (37.2 C)   SpO2: 95%     History of present illness:  Joyce Christenberry McClureis a 75 y.o.femalewith medical history significant ofDM2, chronic hyponatremia, HTN, HFpEF. Presenting with altered mental status.  CT head was negative for any acute intracranial abnormality.  Due to complaints of chest pain and nausea CT angio chest was done which came back positive for acute nonocclusive pulmonary embolus in the lobar branch of the right middle lobe.  Due to her persistent confusion MRI was obtained to rule out any underlying intracranial abnormality prior to starting anticoagulation.  Neurology was consulted for encephalopathy.  No evidence of acute intracranial findings on MRI brain.  She was started on IV heparin for acute right-sided pulmonary embolism.  Switched to Eliquis for treatment of PE on 01/28/21.  She was assessed by PT with recommendation for SNF.  Patient has declined SNF.  She is alert and oriented x4.  She wants to go home with home health services.    01/30/21:  Patient was seen and examined at her bedside.  She has no new complaints.  There were no acute events overnight.  She wants to go home and she is eager to go home.  Hospital Course:   Active Problems:   Acute metabolic encephalopathy  Resolved acute metabolic encephalopathy likely in the setting of TIA or acute illness She is alert and oriented x4. CT head and MRI brain negative for acute intracranial abnormalities UA negative for pyuria. Chest x-ray no acute cardiopulmonary disease.  Acute right-sided pulmonary embolism. Bilateral lower extremity Doppler ultrasound negative for DVT on 01/26/21. Started on heparin drip in the ED Switched to Eliquis on 01/28/21.  Chronic hyponatremia Asymptomatic, serum sodium 127. Responded well to salt tablet. Continue 1 g daily and repeat BMP on 02/03/21.  Chronic right knee pain Suspected small right knee effusion with no other abnormalities. Continue analgesics as needed. Follow-up with your PCP  Resolved post repletion: Hypokalemia Serum potassium 3.4>> 4.7.  Resolved post repletion: Hypomagnesemia. Magnesium 1.4>>2.2  Resolved sinus tachycardia Presented with heart rate in the 140's on twelve-lead EKG, personally reviewed. Continue home atenolol 25 mg daily. 2D echo done on 01/27/2021 showed LVEF greater than 75%.  Resolved acute hypoxic respiratory failure secondary to acute pulmonary embolism Not on oxygen supplementation at baseline.   O2 saturation 95% on room air.  Resolved AKI  Physical debility PT and OT assessment recommended SNF.   Patient has declined SNF.  She wants to go home with home health services.  TOC consulted to assist with home health services arrangement. Continue fall precautions  Hypothyroidism TSH 2.96 Continue home levothyroxine Follow-up with your PCP.  Chronic anxiety/depression Continue home Zoloft  Hyperlipidemia Continue home Lipitor.  Type 2 diabetes, well controlled HgA1C 7.7 on 01/26/21  Resume home regimen and follow-up with your PCP.   Code Status: Full code.   Consultants:  None.  Procedures:  2D  echo.  Antimicrobials:  None.   Discharge Exam: BP 115/81   Pulse 85   Temp 98.9 F (37.2 C) (Oral)   Resp 12   Ht 5' 3" (1.6 m)   Wt 46.1 kg   SpO2 95%   BMI 18.00 kg/m  . General: 75 y.o. year-old female frail-appearing in no acute distress.  She is alert oriented x4. . Cardiovascular: Regular rate and rhythm with no rubs or gallops.  No thyromegaly or JVD noted.   Marland Kitchen Respiratory: Clear to auscultation with no wheezes or rales. Good inspiratory effort. . Abdomen: Soft nontender nondistended with normal bowel sounds x4 quadrants. . Musculoskeletal: No lower extremity edema. Marland Kitchen Psychiatry: Mood is appropriate for condition and setting  Discharge Instructions You were cared for by a hospitalist during your hospital stay. If you have any questions about your discharge medications or the care you received while you were in the hospital after you are discharged, you can call the unit and asked to speak with the hospitalist on call if the hospitalist that took care of you is not available. Once you are discharged, your primary care physician will handle any further medical issues. Please note that NO REFILLS for any discharge medications will be authorized once you are discharged, as it is imperative that you return to your primary care physician (or establish a relationship with a primary care physician if you do not have one) for your aftercare needs so that they can reassess your need for medications and monitor your lab values.   Allergies as of 01/30/2021      Reactions   Ace Inhibitors Anaphylaxis, Swelling   Possible angioedema/anaphylaxis. Tongue swelling, throat closing sensation, shortness of breath. Admission to hospital on 06/03/2020   Penicillins Hives, Itching, Rash   Iron Diarrhea   Rofecoxib Diarrhea, Other (See Comments)   Nickel Rash      Medication List    STOP taking these medications   aspirin 325 MG tablet   furosemide 20 MG tablet Commonly known as:  LASIX   magnesium oxide 400 MG tablet Commonly known as: MAG-OX   oxyCODONE-acetaminophen 7.5-325 MG tablet Commonly known as: PERCOCET   pioglitazone 30 MG tablet Commonly known as: ACTOS   tiZANidine 4 MG tablet Commonly known as: ZANAFLEX     TAKE these medications   alendronate 70 MG tablet Commonly known as: FOSAMAX Take 70 mg by mouth once a week.   allopurinol 100 MG tablet Commonly known as: ZYLOPRIM Take 100 mg by mouth daily.   amitriptyline 50 MG tablet Commonly known as: ELAVIL Take 50 mg by mouth at bedtime.   Apixaban Starter Pack (24m and 569m Commonly known as: ELIQUIS STARTER PACK Take as directed on package: start with two-31m4mablets twice daily for 7 days. On day 8, switch to one-31mg39mblet twice daily.   atenolol 25 MG tablet Commonly known as: TENORMIN Take 25 mg by mouth daily.   atorvastatin 10 MG tablet Commonly known as: LIPITOR Take 10 mg by mouth daily.   Azelastine-Fluticasone 137-50 MCG/ACT Susp Place 2 sprays into the nose 2 (two) times daily. What changed: Another medication with the same name was removed. Continue taking this medication, and follow the directions you see here.   calcium carbonate 1250 (500 Ca) MG chewable tablet Commonly known as: OS-CAL Chew 1 tablet by mouth 3 (three) times  daily.   clotrimazole-betamethasone cream Commonly known as: LOTRISONE Apply 1 application topically 2 (two) times daily.   ipratropium 17 MCG/ACT inhaler Commonly known as: ATROVENT HFA Inhale 2 puffs into the lungs every 6 (six) hours as needed for wheezing.   levothyroxine 50 MCG tablet Commonly known as: SYNTHROID Take 50 mcg by mouth daily before breakfast.   naloxone 4 MG/0.1ML Liqd nasal spray kit Commonly known as: NARCAN Place 1 spray into the nose daily as needed (over dose).   pantoprazole 40 MG tablet Commonly known as: PROTONIX Take 40 mg by mouth daily.   sertraline 50 MG tablet Commonly known as: ZOLOFT Take 50  mg by mouth daily.   sodium chloride 1 g tablet Take 1 tablet (1 g total) by mouth daily for 7 days. Start taking on: January 31, 2021   Vitamin D (Ergocalciferol) 1.25 MG (50000 UNIT) Caps capsule Commonly known as: DRISDOL Take 50,000 Units by mouth every 7 (seven) days.            Durable Medical Equipment  (From admission, onward)         Start     Ordered   01/29/21 0700  For home use only DME Walker rolling  Once       Question Answer Comment  Walker: With 5 Inch Wheels   Patient needs a walker to treat with the following condition Ambulatory dysfunction      01/29/21 0534   01/29/21 0700  For home use only DME 3 n 1  Once        01/29/21 0534   01/28/21 1530  For home use only DME 3 n 1  Once        01/28/21 1529   01/28/21 1530  For home use only DME Walker rolling  Once       Question Answer Comment  Walker: With [place] Wheels   Patient needs a walker to treat with the following condition Ambulatory dysfunction      01/28/21 1529         Allergies  Allergen Reactions  . Ace Inhibitors Anaphylaxis and Swelling    Possible angioedema/anaphylaxis. Tongue swelling, throat closing sensation, shortness of breath. Admission to hospital on 06/03/2020   . Penicillins Hives, Itching and Rash  . Iron Diarrhea  . Rofecoxib Diarrhea and Other (See Comments)  . Nickel Rash    Follow-up Information    Teresita Madura, MD. Call in 1 day(s).   Specialty: Internal Medicine Why: please call for a post hospital follow up appointment. Contact information: [address] [place] [address] [phone]                The results of significant diagnostics from this hospitalization (including imaging, microbiology, ancillary and laboratory) are listed below for reference.    Significant Diagnostic Studies: CT Head Wo Contrast  Result Date: 01/25/2021 CLINICAL DATA:  Mental status change, unknown cause. Additional history provided: Right-sided  weakness since 07:30 today. EXAM: CT HEAD WITHOUT CONTRAST TECHNIQUE: Contiguous axial images were obtained from the base of the skull through the vertex without intravenous contrast. COMPARISON:  No pertinent prior exams available for comparison. FINDINGS: Brain: Moderate cerebral atrophy. Associated prominence of the ventricles and sulci. Comparatively mild cerebellar atrophy. Moderate ill-defined hypoattenuation within the cerebral white matter is nonspecific, but compatible with chronic small vessel ischemic disease. There is no acute intracranial hemorrhage. No demarcated cortical infarct. No extra-axial fluid collection. No evidence of intracranial mass. No  midline shift. Vascular: No hyperdense vessel. Atherosclerotic calcifications. Skull: Normal. Negative for fracture or focal lesion. Sinuses/Orbits: Visualized orbits show no acute finding. Complete opacification of the partially imaged right maxillary sinus. Extensive partial opacification of the partially imaged left maxillary sinus. Moderate ethmoid sinus mucosal thickening. IMPRESSION: No CT evidence of acute intracranial abnormality. Moderate cerebral atrophy and chronic small vessel ischemic disease. Comparatively mild cerebellar atrophy. Bilateral ethmoid and maxillary paranasal sinus disease at the imaged levels, as described. Electronically Signed   By: Kellie Simmering DO   On: 01/25/2021 16:55   CT Angio Chest PE W and/or Wo Contrast  Result Date: 01/25/2021 CLINICAL DATA:  Right-sided weakness since 07:30 today. Concern for PE. Abdominal pain. Prior hysterectomy and cholecystectomy. EXAM: CT ANGIOGRAPHY CHEST CT ABDOMEN AND PELVIS WITH CONTRAST TECHNIQUE: Multidetector CT imaging of the chest was performed using the standard protocol during bolus administration of intravenous contrast. Multiplanar CT image reconstructions and MIPs were obtained to evaluate the vascular anatomy. Multidetector CT imaging of the abdomen and pelvis was performed  using the standard protocol during bolus administration of intravenous contrast. CONTRAST:  28m OMNIPAQUE IOHEXOL 350 MG/ML SOLN COMPARISON:  Chest radiograph from earlier today. 02/13/2018 CT abdomen/pelvis. FINDINGS: CTA CHEST FINDINGS Cardiovascular: The study is high quality for the evaluation of pulmonary embolism. Acute nonocclusive pulmonary embolus in the lobar branch of the right middle lobe (series 6/image 143). No additional pulmonary emboli. No saddle embolus. Atherosclerotic nonaneurysmal thoracic aorta. Dilated main pulmonary artery (3.5 cm diameter). Top-normal heart size. No significant pericardial fluid/thickening. Three-vessel coronary atherosclerosis. Top-normal RV/LV ratio 0.9. Mediastinum/Nodes: No discrete thyroid nodules. Unremarkable esophagus. No axillary or mediastinal adenopathy. Mild right hilar adenopathy up to 1.1 cm (series 6/image 122). No left hilar adenopathy. Lungs/Pleura: No pneumothorax. No pleural effusion. Mild centrilobular emphysema with mild diffuse bronchial wall thickening. No acute consolidative airspace disease or lung masses. Solid 3 mm left upper lobe pulmonary nodule (series 5/image 30). No additional significant pulmonary nodules. Musculoskeletal: No aggressive appearing focal osseous lesions. Moderate thoracic spondylosis. Review of the MIP images confirms the above findings. CT ABDOMEN and PELVIS FINDINGS Hepatobiliary: Normal liver with no liver mass. Cholecystectomy. Bile ducts are within normal post cholecystectomy limits with CBD diameter 6 mm. Pancreas: Normal, with no mass or duct dilation. Spleen: Normal size. No mass. Adrenals/Urinary Tract: Normal adrenals. No hydronephrosis. Hypodense exophytic 1.4 cm posterior lower left renal cortical lesion (series 5/image 24), slightly decreased from 1.6 cm on 02/13/2018 CT. Simple 1.2 cm upper right renal cyst. Numerous subcentimeter hypodense renal cortical lesions scattered in both kidneys are too small to  characterize. Normal bladder. Stomach/Bowel: Normal non-distended stomach. Normal caliber small bowel with no small bowel wall thickening. Appendix is stable and within normal limits. Scattered fluid levels in the relatively collapsed large bowel with no large bowel wall thickening, significant diverticulosis or significant pericolonic fat stranding. Vascular/Lymphatic: Atherosclerotic nonaneurysmal abdominal aorta. Patent portal, splenic, hepatic and renal veins. No pathologically enlarged lymph nodes in the abdomen or pelvis. Reproductive: Status post hysterectomy, with no abnormal findings at the vaginal cuff. No adnexal mass. Other: No pneumoperitoneum, ascites or focal fluid collection. Small fat containing umbilical hernia is unchanged. Musculoskeletal: No aggressive appearing focal osseous lesions. Marked lower lumbar spondylosis. Review of the MIP images confirms the above findings. IMPRESSION: 1. Acute nonocclusive pulmonary embolus in the lobar branch of the right middle lobe. No central pulmonary emboli. 2. Dilated main pulmonary artery, suggesting pulmonary arterial hypertension. 3. Nonspecific mild right hilar adenopathy. Suggest attention on follow-up  chest CT with IV contrast in 3 months given smoking related changes in the lungs. 4. Scattered fluid levels in the relatively collapsed large bowel, suggesting a nonspecific malabsorptive / diarrheal state. No evidence of bowel obstruction or acute bowel inflammation. 5. Indeterminate exophytic 1.4 cm posterior lower left renal cortical lesion, slightly decreased in size since 02/13/2018 CT. Outpatient MRI or CT abdomen without and with IV contrast recommended for further characterization when clinically feasible. 6. Aortic Atherosclerosis (ICD10-I70.0) and Emphysema (ICD10-J43.9). Critical Value/emergent results were called by telephone at the time of interpretation on 01/25/2021 at 8:37 pm to provider [facility] , who verbally acknowledged these  results. Electronically Signed   By: Ilona Sorrel M.D.   On: 01/25/2021 20:38   MR BRAIN WO CONTRAST  Result Date: 01/26/2021 CLINICAL DATA:  Transient ischemic attack. Slurred speech and difficulty walking. EXAM: MRI HEAD WITHOUT CONTRAST TECHNIQUE: Multiplanar, multiecho pulse sequences of the brain and surrounding structures were obtained without intravenous contrast. COMPARISON:  Head CT yesterday. FINDINGS: Brain: Diffusion imaging does not show any acute or subacute infarction. Chronic small-vessel ischemic changes are seen throughout the pons. No focal cerebellar insult. Cerebral hemispheres show pronounced chronic small-vessel ischemic changes throughout the deep and subcortical white matter. No cortical or large vessel territory infarction. No mass lesion, hemorrhage, hydrocephalus or extra-axial collection. Vascular: Major vessels at the base of the brain show flow. Skull and upper cervical spine: Negative Sinuses/Orbits: Rhinosinusitis of the maxillary sinuses. Orbits negative. Other: Left mastoid effusion. IMPRESSION: 1. No acute or reversible finding. Extensive chronic small-vessel ischemic changes throughout the brain. 2. Rhinosinusitis of the maxillary sinuses. Left mastoid effusion. Electronically Signed   By: Nelson Chimes M.D.   On: 01/26/2021 17:34   CT ABDOMEN PELVIS W CONTRAST  Result Date: 01/25/2021 CLINICAL DATA:  Right-sided weakness since 07:30 today. Concern for PE. Abdominal pain. Prior hysterectomy and cholecystectomy. EXAM: CT ANGIOGRAPHY CHEST CT ABDOMEN AND PELVIS WITH CONTRAST TECHNIQUE: Multidetector CT imaging of the chest was performed using the standard protocol during bolus administration of intravenous contrast. Multiplanar CT image reconstructions and MIPs were obtained to evaluate the vascular anatomy. Multidetector CT imaging of the abdomen and pelvis was performed using the standard protocol during bolus administration of intravenous contrast. CONTRAST:  27m  OMNIPAQUE IOHEXOL 350 MG/ML SOLN COMPARISON:  Chest radiograph from earlier today. 02/13/2018 CT abdomen/pelvis. FINDINGS: CTA CHEST FINDINGS Cardiovascular: The study is high quality for the evaluation of pulmonary embolism. Acute nonocclusive pulmonary embolus in the lobar branch of the right middle lobe (series 6/image 143). No additional pulmonary emboli. No saddle embolus. Atherosclerotic nonaneurysmal thoracic aorta. Dilated main pulmonary artery (3.5 cm diameter). Top-normal heart size. No significant pericardial fluid/thickening. Three-vessel coronary atherosclerosis. Top-normal RV/LV ratio 0.9. Mediastinum/Nodes: No discrete thyroid nodules. Unremarkable esophagus. No axillary or mediastinal adenopathy. Mild right hilar adenopathy up to 1.1 cm (series 6/image 122). No left hilar adenopathy. Lungs/Pleura: No pneumothorax. No pleural effusion. Mild centrilobular emphysema with mild diffuse bronchial wall thickening. No acute consolidative airspace disease or lung masses. Solid 3 mm left upper lobe pulmonary nodule (series 5/image 30). No additional significant pulmonary nodules. Musculoskeletal: No aggressive appearing focal osseous lesions. Moderate thoracic spondylosis. Review of the MIP images confirms the above findings. CT ABDOMEN and PELVIS FINDINGS Hepatobiliary: Normal liver with no liver mass. Cholecystectomy. Bile ducts are within normal post cholecystectomy limits with CBD diameter 6 mm. Pancreas: Normal, with no mass or duct dilation. Spleen: Normal size. No mass. Adrenals/Urinary Tract: Normal adrenals. No hydronephrosis. Hypodense exophytic 1.4  cm posterior lower left renal cortical lesion (series 5/image 24), slightly decreased from 1.6 cm on 02/13/2018 CT. Simple 1.2 cm upper right renal cyst. Numerous subcentimeter hypodense renal cortical lesions scattered in both kidneys are too small to characterize. Normal bladder. Stomach/Bowel: Normal non-distended stomach. Normal caliber small bowel  with no small bowel wall thickening. Appendix is stable and within normal limits. Scattered fluid levels in the relatively collapsed large bowel with no large bowel wall thickening, significant diverticulosis or significant pericolonic fat stranding. Vascular/Lymphatic: Atherosclerotic nonaneurysmal abdominal aorta. Patent portal, splenic, hepatic and renal veins. No pathologically enlarged lymph nodes in the abdomen or pelvis. Reproductive: Status post hysterectomy, with no abnormal findings at the vaginal cuff. No adnexal mass. Other: No pneumoperitoneum, ascites or focal fluid collection. Small fat containing umbilical hernia is unchanged. Musculoskeletal: No aggressive appearing focal osseous lesions. Marked lower lumbar spondylosis. Review of the MIP images confirms the above findings. IMPRESSION: 1. Acute nonocclusive pulmonary embolus in the lobar branch of the right middle lobe. No central pulmonary emboli. 2. Dilated main pulmonary artery, suggesting pulmonary arterial hypertension. 3. Nonspecific mild right hilar adenopathy. Suggest attention on follow-up chest CT with IV contrast in 3 months given smoking related changes in the lungs. 4. Scattered fluid levels in the relatively collapsed large bowel, suggesting a nonspecific malabsorptive / diarrheal state. No evidence of bowel obstruction or acute bowel inflammation. 5. Indeterminate exophytic 1.4 cm posterior lower left renal cortical lesion, slightly decreased in size since 02/13/2018 CT. Outpatient MRI or CT abdomen without and with IV contrast recommended for further characterization when clinically feasible. 6. Aortic Atherosclerosis (ICD10-I70.0) and Emphysema (ICD10-J43.9). Critical Value/emergent results were called by telephone at the time of interpretation on 01/25/2021 at 8:37 pm to provider [facility] , who verbally acknowledged these results. Electronically Signed   By: Ilona Sorrel M.D.   On: 01/25/2021 20:38   DG Chest Portable 1  View  Result Date: 01/25/2021 CLINICAL DATA:  Weakness. EXAM: PORTABLE CHEST 1 VIEW COMPARISON:  Chest x-ray 03/11/2013. FINDINGS: Patient is rotated to the right. Mediastinum and hilar structures normal. Heart size normal. Thoracic aorta is tortuous. No focal infiltrate. No pleural effusion or pneumothorax. No acute bony abnormality. Surgical clips right upper quadrant. Well-circumscribed calcification noted over the left axilla, most likely calcification in a benign lymph node. IMPRESSION: No acute cardiopulmonary disease. Electronically Signed   By: Marcello Moores  Register   On: 01/25/2021 16:56   DG Knee Right Port  Result Date: 01/29/2021 CLINICAL DATA:  Patient fell on knee multiple years ago.  Pain. EXAM: PORTABLE RIGHT KNEE - 1-2 VIEW COMPARISON:  None. FINDINGS: Vascular calcifications. No fractures or dislocations. Suspected small effusion. No other abnormalities. IMPRESSION: Suspected small effusion.  No other abnormalities. Electronically Signed   By: Dorise Bullion III M.D   On: 01/29/2021 12:24   ECHOCARDIOGRAM COMPLETE  Result Date: 01/27/2021    ECHOCARDIOGRAM REPORT   Patient Name:   DEMEKA SUTTER [facility] Date of Exam: 01/27/2021 Medical Rec #:  [id number]      Height:       63.0 in Accession #:    [id number]     Weight:       101.6 lb Date of Birth:  1946-03-08      BSA:          1.450 m Patient Age:    80 years       BP:           139/83 mmHg Patient Gender: F  HR:           135 bpm. Exam Location:  Inpatient Procedure: Limited Echo, Color Doppler and Cardiac Doppler Indications:    Pulmonary embolus  History:        Patient has no prior history of Echocardiogram examinations.                 Arrythmias:Tachycardia, Signs/Symptoms:Shortness of Breath and                 Altered Mental Status; Risk Factors:Dyslipidemia and Pulmonary                 embolus.  Sonographer:    Dustin Flock Referring Phys: [id number] Jonnie Finner  Sonographer Comments: Looking for RV strain - Limited because  HR elevated IMPRESSIONS  1. Left ventricular ejection fraction, by estimation, is >75%. The left ventricle has hyperdynamic function. The left ventricle has no regional wall motion abnormalities. Left ventricular diastolic parameters are indeterminate.  2. Subcostal views of RV appear normal contraction. Right ventricular systolic function is normal. The right ventricular size is normal.  3. The mitral valve is normal in structure. No evidence of mitral valve regurgitation. No evidence of mitral stenosis.  4. The aortic valve is normal in structure. Aortic valve regurgitation is not visualized. No aortic stenosis is present.  5. The inferior vena cava is normal in size with greater than 50% respiratory variability, suggesting right atrial pressure of 3 mmHg. FINDINGS  Left Ventricle: Left ventricular ejection fraction, by estimation, is >75%. The left ventricle has hyperdynamic function. The left ventricle has no regional wall motion abnormalities. The left ventricular internal cavity size was normal in size. There is no left ventricular hypertrophy. Left ventricular diastolic parameters are indeterminate. Right Ventricle: Subcostal views of RV appear normal contraction. The right ventricular size is normal. No increase in right ventricular wall thickness. Right ventricular systolic function is normal. Left Atrium: Left atrial size was normal in size. Right Atrium: Right atrial size was normal in size. Pericardium: There is no evidence of pericardial effusion. Mitral Valve: The mitral valve is normal in structure. No evidence of mitral valve regurgitation. No evidence of mitral valve stenosis. Tricuspid Valve: The tricuspid valve is normal in structure. Tricuspid valve regurgitation is mild . No evidence of tricuspid stenosis. Aortic Valve: The aortic valve is normal in structure. Aortic valve regurgitation is not visualized. No aortic stenosis is present. Pulmonic Valve: The pulmonic valve was normal in structure.  Pulmonic valve regurgitation is not visualized. No evidence of pulmonic stenosis. Aorta: The aortic root is normal in size and structure. Venous: The inferior vena cava is normal in size with greater than 50% respiratory variability, suggesting right atrial pressure of 3 mmHg. IAS/Shunts: No atrial level shunt detected by color flow Doppler.  LEFT VENTRICLE PLAX 2D LVIDd:         3.50 cm LVIDs:         1.90 cm LV PW:         1.20 cm LV IVS:        1.20 cm  Candee Furbish MD Electronically signed by Candee Furbish MD Signature Date/Time: 01/27/2021/4:11:56 PM    Final    VAS [place] LOWER EXTREMITY VENOUS (DVT)  Result Date: 01/26/2021  Lower Venous DVT Study Indications: Pulmonary embolism.  Comparison Study: No prior exams. Performing Technologist: Rogelia Rohrer  Examination Guidelines: A complete evaluation includes B-mode imaging, spectral Doppler, color Doppler, and power Doppler as needed of all accessible portions of  each vessel. Bilateral testing is considered an integral part of a complete examination. Limited examinations for reoccurring indications may be performed as noted. The reflux portion of the exam is performed with the patient in reverse Trendelenburg.  +---------+---------------+---------+-----------+----------+-------------------+ RIGHT    CompressibilityPhasicitySpontaneityPropertiesThrombus Aging      +---------+---------------+---------+-----------+----------+-------------------+ CFV      Full           Yes      Yes                                      +---------+---------------+---------+-----------+----------+-------------------+ SFJ      Full                                                             +---------+---------------+---------+-----------+----------+-------------------+ FV Prox  Full           Yes      Yes                                      +---------+---------------+---------+-----------+----------+-------------------+ FV Mid   Full           Yes      Yes                                       +---------+---------------+---------+-----------+----------+-------------------+ FV DistalFull           Yes      Yes                                      +---------+---------------+---------+-----------+----------+-------------------+ PFV      Full                                                             +---------+---------------+---------+-----------+----------+-------------------+ POP      Full           Yes      Yes                                      +---------+---------------+---------+-----------+----------+-------------------+ PTV      Full                                         Not well visualized +---------+---------------+---------+-----------+----------+-------------------+ PERO     Full                                         Not well visualized +---------+---------------+---------+-----------+----------+-------------------+   +---------+---------------+---------+-----------+----------+--------------+ LEFT     CompressibilityPhasicitySpontaneityPropertiesThrombus Aging +---------+---------------+---------+-----------+----------+--------------+  CFV      Full           Yes      Yes                                 +---------+---------------+---------+-----------+----------+--------------+ SFJ      Full                                                        +---------+---------------+---------+-----------+----------+--------------+ FV Prox  Full           Yes      Yes                                 +---------+---------------+---------+-----------+----------+--------------+ FV Mid   Full           Yes      Yes                                 +---------+---------------+---------+-----------+----------+--------------+ FV DistalFull           Yes      Yes                                 +---------+---------------+---------+-----------+----------+--------------+ PFV      Full                                                         +---------+---------------+---------+-----------+----------+--------------+ POP      Full           Yes      Yes                                 +---------+---------------+---------+-----------+----------+--------------+ PTV      Full                                                        +---------+---------------+---------+-----------+----------+--------------+ PERO     Full                                                        +---------+---------------+---------+-----------+----------+--------------+     Summary: BILATERAL: - No evidence of deep vein thrombosis seen in the lower extremities, bilaterally. - No evidence of superficial venous thrombosis in the lower extremities, bilaterally. -No evidence of popliteal cyst, bilaterally.   *See table(s) above for measurements and observations. Electronically signed by Monica Martinez MD on 01/26/2021 at 4:34:44 PM.    Final     Microbiology: Recent Results (from the past 240 hour(s))  Resp Panel by RT-PCR (Flu A&B, Covid) Nasopharyngeal Swab     Status: None   Collection Time: 01/25/21  4:32 PM   Specimen: Nasopharyngeal Swab; Nasopharyngeal(NP) swabs in vial transport medium  Result Value Ref Range Status   SARS Coronavirus 2 by RT PCR NEGATIVE NEGATIVE Final    Comment: (NOTE) SARS-CoV-2 target nucleic acids are NOT DETECTED.  The SARS-CoV-2 RNA is generally detectable in upper respiratory specimens during the acute phase of infection. The lowest concentration of SARS-CoV-2 viral copies this assay can detect is 138 copies/mL. A negative result does not preclude SARS-Cov-2 infection and should not be used as the sole basis for treatment or other patient management decisions. A negative result may occur with  improper specimen collection/handling, submission of specimen other than nasopharyngeal swab, presence of viral mutation(s) within the areas targeted by this assay, and  inadequate number of viral copies(<138 copies/mL). A negative result must be combined with clinical observations, patient history, and epidemiological information. The expected result is Negative.  Fact Sheet for Patients:  EntrepreneurPulse.com.au  Fact Sheet for Healthcare Providers:  IncredibleEmployment.be  This test is no t yet approved or cleared by the [place] FDA and  has been authorized for detection and/or diagnosis of SARS-CoV-2 by FDA under an Emergency Use Authorization (EUA). This EUA will remain  in effect (meaning this test can be used) for the duration of the COVID-19 declaration under Section 564(b)(1) of the Act, 21 U.S.C.section 360bbb-3(b)(1), unless the authorization is terminated  or revoked sooner.       Influenza A by PCR NEGATIVE NEGATIVE Final   Influenza B by PCR NEGATIVE NEGATIVE Final    Comment: (NOTE) The Xpert Xpress SARS-CoV-2/FLU/RSV plus assay is intended as an aid in the diagnosis of influenza from Nasopharyngeal swab specimens and should not be used as a sole basis for treatment. Nasal washings and aspirates are unacceptable for Xpert Xpress SARS-CoV-2/FLU/RSV testing.  Fact Sheet for Patients: EntrepreneurPulse.com.au  Fact Sheet for Healthcare Providers: IncredibleEmployment.be  This test is not yet approved or cleared by the [place] FDA and has been authorized for detection and/or diagnosis of SARS-CoV-2 by FDA under an Emergency Use Authorization (EUA). This EUA will remain in effect (meaning this test can be used) for the duration of the COVID-19 declaration under Section 564(b)(1) of the Act, 21 U.S.C. section 360bbb-3(b)(1), unless the authorization is terminated or revoked.  Performed at [facility], [address] [place], [place] [address]      Labs: Basic Metabolic Panel: Recent Labs  Lab 01/25/21 1537 01/26/21 1217  01/28/21 0807 01/29/21 0537 01/29/21 1704 01/30/21 0401 01/30/21 1037  NA 131* 134* 133* 127* 125* 125* 127*  K 4.3 3.5 3.4* 4.7  --  4.6  --   CL 95* 97* 100 98  --  97*  --   CO2 25 25 21* 23  --  22  --   GLUCOSE 139* 89 95 124*  --  126*  --   BUN _0 24*  --  35*  --   CREATININE 1.14* 1.02* 0.87 0.78  --  0.81  --   CALCIUM 8.7* 8.5* 8.1* 8.1*  --  8.3*  --   MG  --  1.6* 1.4* 2.2  --  1.8  --   PHOS  --  3.2  --   --   --   --   --    Liver Function Tests: Recent Labs  Lab 01/25/21 1537  01/26/21 1217  AST 20 17  ALT 13 15  ALKPHOS 50 53  BILITOT 1.1 1.2  PROT 7.9 7.4  ALBUMIN 3.2* 2.9*   No results for input(s): LIPASE, AMYLASE in the last 168 hours. Recent Labs  Lab 01/26/21 2016  AMMONIA 13   CBC: Recent Labs  Lab 01/26/21 1217 01/27/21 0215 01/28/21 0132 01/29/21 0429 01/30/21 0401  WBC 7.5 7.1 7.2 8.5 8.8  NEUTROABS 4.6  --   --   --   --   HGB 12.6 11.3* 11.7* 11.0* 10.3*  HCT 39.0 35.9* 36.6 33.7* 31.6*  MCV 92.0 92.1 92.2 89.6 90.0  PLT 232 201 231 223 223   Cardiac Enzymes: No results for input(s): CKTOTAL, CKMB, CKMBINDEX, TROPONINI in the last 168 hours. BNP: BNP (last 3 results) No results for input(s): BNP in the last 8760 hours.  ProBNP (last 3 results) No results for input(s): PROBNP in the last 8760 hours.  CBG: Recent Labs  Lab 01/29/21 1138 01/29/21 1638 01/29/21 2244 01/30/21 0758 01/30/21 1158  GLUCAP 162* 131* 132* 124* 126*       Signed:  Kayleen Memos, MD [facility] 01/30/2021, 2:19 PM

## 2021-01-30 NOTE — Progress Notes (Signed)
Patient discharging home.  IV removed - WNL.  Reviewed AVS and medications, instructed to follow up with PCP.  HH in place per TOC.  Emphasized bleeding precautions while taking eliquis.  Patient verbalizes understanding.  Patient in NAD at this time.

## 2021-01-30 NOTE — TOC Transition Note (Addendum)
Transition of Care [facility]) - CM/SW Discharge Note   Patient Details  Name: Joyce Palmer MRN: [id number] Date of Birth: May 14, 1946  [facility] CM/SW Contact:  Darleene Cleaver, LCSW Phone Number: 01/30/2021, 2:48 PM   Clinical Narrative:     Patient will be going to niece's home [place],  in [place], with home health through [place].  CSW signing off please reconsult with any other social work needs, home health agency has been notified of planned discharge.  Patient's niece Joyce Palmer is aware that patient is discharging today.  Patient has family member at bedside who will bring her back home.  CSW signing off for now.     Final next level of care: Home w Home Health Services Barriers to Discharge: Barriers Resolved   Patient Goals and CMS Choice Patient states their goals for this hospitalization and ongoing recovery are:: To go to [facility] with her niece. CMS Medicare.gov Compare Post Acute Care list provided to:: Other (Comment Required) (Patient's niece Joyce Palmer) Choice offered to / list presented to :  (Patient's niece [place])  Discharge Placement                       Discharge Plan and Services                DME Arranged: Dan Humphreys rolling,3-N-1 DME Agency: Other - Comment Date DME Agency Contacted: 01/30/21 Time DME Agency Contacted: [phone] Representative spoke with at DME Agency: Vaughan Basta HH Arranged: PT,OT,Nurse's Aide,RN,Social Work,Refused SNF HH Agency: [facility] Date [facility] Agency Contacted: 01/30/21 Time HH Agency Contacted: [phone] Representative spoke with at [facility] Agency: Elnita Maxwell  Social Determinants of Health (SDOH) Interventions     Readmission Risk Interventions No flowsheet data found.

## 2021-01-30 NOTE — Discharge Instructions (Signed)
Pulmonary Embolism  A pulmonary embolism (PE) is a sudden blockage or decrease of blood flow in one or both lungs that happens when a clot travels into the arteries of the lung (pulmonary arteries). Most blockages come from a blood clot that forms in the vein of a leg or arm (deep vein thrombosis, DVT) and travels to the lungs. A clot is blood that has thickened into a gel or solid. PE is a dangerous and life-threatening condition that needs to be treated right away. What are the causes? This condition is usually caused by a blood clot that forms in a vein and moves to the lungs. In rare cases, it may be caused by air, fat, part of a tumor, or other tissue that moves through the veins and into the lungs. What increases the risk? The following factors may make you more likely to develop this condition:  Experiencing a traumatic injury, such as breaking a hip or leg.  Having: ? A spinal cord injury. ? Major surgery, especially hip or knee replacement, or surgery on parts of the nervous system or on the abdomen. ? A stroke. ? A blood clotting disease. ? Long-term (chronic) lung or heart disease. ? Cancer, especially if you are being treated with chemotherapy. ? A central venous catheter.  Taking medicines that contain estrogen. These include birth control pills and hormone replacement therapy.  Being: ? Pregnant. ? In the period of time after your baby is delivered (postpartum). ? Older than age 57. ? Overweight. ? A smoker, especially if you have other risks. ? Not very active (sedentary), not being able to move at all, or spending long periods sitting, such as travel over 6 hours. You are also at a greater risk if you have a leg in a cast or splint. What are the signs or symptoms? Symptoms of this condition usually start suddenly and include:  Shortness of breath during activity or at rest.  Coughing, coughing up blood, or coughing up bloody mucus.  Chest pain, back pain, or  shoulder blade pain that gets worse with deep breaths.  Rapid or irregular heartbeat.  Feeling light-headed or dizzy, or fainting.  Feeling anxious.  Pain and swelling in a leg. This is a symptom of DVT, which can lead to PE. How is this diagnosed? This condition may be diagnosed based on your medical history, a physical exam, and tests. Tests may include:  Blood tests.  An ECG (electrocardiogram) of the heart.  A CT pulmonary angiogram. This test checks blood flow in and around your lungs.  A ventilation-perfusion scan, also called a lung VQ scan. This test measures air flow and blood flow to the lungs.  An ultrasound to check for a DVT. How is this treated? Treatment for this condition depends on many factors, such as the cause of your PE, your risk for bleeding or developing more clots, and other medical conditions you may have. Treatment aims to stop blood clots from forming or growing larger. In some cases, treatment may be aimed at breaking apart or removing the blood clot. Treatment may include:  Medicines, such as: ? Blood thinning medicines, also called anticoagulants, to stop clots from forming and growing. ? Medicines that break apart clots (thrombolytics).  Procedures, such as: ? Using a flexible tube to remove a blood clot (embolectomy) or to deliver medicine to destroy it (catheter-directed thrombolysis). ? Surgery to remove the clot (surgical embolectomy). This is rare. You may need a combination of immediate, long-term, and  extended treatments. Your treatment may continue for several months (maintenance therapy) or longer depending on your medical conditions. You and your health care provider will work together to choose the treatment program that is best for you. Follow these instructions at home: Medicines  Take over-the-counter and prescription medicines only as told by your health care provider.  If you are taking blood thinners: ? Talk with your health care  provider before you take any medicines that contain aspirin or NSAIDs, such as ibuprofen. These medicines increase your risk for dangerous bleeding. ? Take your medicine exactly as told, at the same time every day. ? Avoid activities that could cause injury or bruising, and follow instructions about how to prevent falls. ? Wear a medical alert bracelet or carry a card that lists what medicines you take.  Understand what foods and drugs interact with any medicines that you are taking. General instructions  Ask your health care provider when you may return to your normal activities. Avoid sitting or lying for a long time without moving.  Maintain a healthy weight. Ask your health care provider what weight is healthy for you.  Do not use any products that contain nicotine or tobacco, such as cigarettes, e-cigarettes, and chewing tobacco. If you need help quitting, ask your health care provider.  Talk with your health care provider about any travel plans. It is important to make sure that you are still able to take your medicine while traveling.  Keep all follow-up visits as told by your health care provider. This is important. Where to find more information  American Lung Association: www.lung.org  Centers for Disease Control and Prevention: http://www.wolf.info/ Contact a health care provider if:  You missed a dose of your blood thinner medicine. Get help right away if you:  Have: ? New or increased pain, swelling, warmth, or redness in an arm or leg. ? Shortness of breath that gets worse during activity or at rest. ? A fever. ? Worsening chest pain. ? A rapid or irregular heartbeat. ? A severe headache. ? Vision changes. ? A serious fall or accident, or you hit your head. ? Stomach pain. ? Blood in your vomit, stool, or urine. ? A cut that will not stop bleeding.  Cough up blood.  Feel light-headed or dizzy, and that feeling does not go away.  Cannot move your arms or legs.  Are  confused or have memory loss. These symptoms may represent a serious problem that is an emergency. Do not wait to see if the symptoms will go away. Get medical help right away. Call your local emergency services (911 in the [place] Do not drive yourself to the hospital. Summary  A pulmonary embolism (PE) is a serious and potentially life-threatening condition, in which a blood clot from one part of the body (deep vein thrombosis, DVT) travels to the arteries of the lung, causing a sudden blockage or decrease of blood flow to the lungs. This may result in shortness of breath, chest pain, dizziness, and fainting.  Treatments for this condition usually include medicines to thin your blood (anticoagulants) or medicines to break apart blood clots (thrombolytics).  If you are given blood thinners, take your medicine exactly as told by your health care provider, at the same time every day. This is important.  Understand what foods and drugs interact with any medicines that you are taking.  If you have signs of PE or DVT, call your local emergency services (911 in the [place] This information is  not intended to replace advice given to you by your health care provider. Make sure you discuss any questions you have with your health care provider. Document Revised: 10/09/2019 Document Reviewed: 10/09/2019 Elsevier Patient Education  2021 Elsevier Inc. Information on my medicine - ELIQUIS (apixaban)  Why was Eliquis prescribed for you? Eliquis was prescribed to treat blood clots that may have been found in the veins of your legs (deep vein thrombosis) or in your lungs (pulmonary embolism) and to reduce the risk of them occurring again.  What do You need to know about Eliquis ? The starting dose is 10 mg (two 5 mg tablets) taken TWICE daily for the FIRST SEVEN (7) DAYS, then on (enter date)  February 26th  the dose is reduced to ONE 5 mg tablet taken TWICE daily.  Eliquis may be taken with or without  food.   Try to take the dose about the same time in the morning and in the evening. If you have difficulty swallowing the tablet whole please discuss with your pharmacist how to take the medication safely.  Take Eliquis exactly as prescribed and DO NOT stop taking Eliquis without talking to the doctor who prescribed the medication.  Stopping may increase your risk of developing a new blood clot.  Refill your prescription before you run out.  After discharge, you should have regular check-up appointments with your healthcare provider that is prescribing your Eliquis.    What do you do if you miss a dose? If a dose of ELIQUIS is not taken at the scheduled time, take it as soon as possible on the same day and twice-daily administration should be resumed. The dose should not be doubled to make up for a missed dose.  Important Safety Information A possible side effect of Eliquis is bleeding. You should call your healthcare provider right away if you experience any of the following: ? Bleeding from an injury or your nose that does not stop. ? Unusual colored urine (red or dark brown) or unusual colored stools (red or black). ? Unusual bruising for unknown reasons. ? A serious fall or if you hit your head (even if there is no bleeding).  Some medicines may interact with Eliquis and might increase your risk of bleeding or clotting while on Eliquis. To help avoid this, consult your healthcare provider or pharmacist prior to using any new prescription or non-prescription medications, including herbals, vitamins, non-steroidal anti-inflammatory drugs (NSAIDs) and supplements.  This website has more information on Eliquis (apixaban): http://www.eliquis.com/eliquis/home

## 2021-02-04 ENCOUNTER — Emergency Department: Admit: 2021-02-05 | Payer: MEDICARE

## 2021-02-04 DIAGNOSIS — K921 Melena: Secondary | ICD-10-CM

## 2021-02-04 NOTE — ED Notes (Signed)
Patient vomiting in triage, patient roomed to complete triage     Placed on cardiac/resp monitoring     Patient was recently hospitalized in [place] for PE and started on blood thinners

## 2021-02-04 NOTE — ED Notes (Signed)
Fluids infusing per MAR via pressure bag.

## 2021-02-04 NOTE — ED Notes (Signed)
Assumed care of patient from triage. Patient to ED 19 via Wheelchair with niece at bedside.     Patient complaining of nausea, vomiting, abdominal pain, chills, fatigue, dark stools, blood-tinged emesis, lethargy, and confusion/AMS (noted by patient's niece. Niece states that the patient was recently hospitalized in [place] for diagnosis of a PE and was started on Eliquis, which is the patient's first time being on anticoagulants. 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 Niece reports that patient began having her symptoms today, which have been increasing since their onset. Patient is not able to answer most questions appropriately, moaning in pain with occasional dry heaving and brown/reddish emesis. Alert to self and niece.     Patient placed on central cardiac monitor with BP cycling Q5M (hypotensive at this time, MD aware) and continuous SpO2 monitoring in place.     Call light placed at bedside within patient and niece's reach.

## 2021-02-04 NOTE — ED Notes (Signed)
Patient arrives to ED with complaints of abdominal pain and blood in stool    Patient takes eliquis

## 2021-02-04 NOTE — ED Notes (Signed)
Rectal exam performed by Dr Sena Slate with Shanda Bumps RN at bedside

## 2021-02-04 NOTE — ED Provider Notes (Signed)
75 year old female presents with a chief complaint of black stool.  Patient was recently admitted to hospital in [place] and diagnosed with a pulmonary embolism.  She was started on Eliquis.  Her niece went to [place] and picked her up last week and brought her to [place].  Patient has not been feeling well since arriving in [place].  Today she developed dark black stool.  Patient endorses epigastric pain.  She has no history of GI bleed.  Patient endorses feeling lightheaded but denies chest pain or shortness of breath.           No past medical history on file.    No past surgical history on file.      No family history on file.    Social History     Socioeconomic History   ??? Marital status: SINGLE     Spouse name: Not on file   ??? Number of children: Not on file   ??? Years of education: Not on file   ??? Highest education level: Not on file   Occupational History   ??? Not on file   Tobacco Use   ??? Smoking status: Not on file   ??? Smokeless tobacco: Not on file   Substance and Sexual Activity   ??? Alcohol use: Not on file   ??? Drug use: Not on file   ??? Sexual activity: Not on file   Other Topics Concern   ??? Not on file   Social History Narrative   ??? Not on file     Social Determinants of Health     Financial Resource Strain:    ??? Difficulty of Paying Living Expenses: Not on file   Food Insecurity:    ??? Worried About Running Out of Food in the Last Year: Not on file   ??? Ran Out of Food in the Last Year: Not on file   Transportation Needs:    ??? Lack of Transportation (Medical): Not on file   ??? Lack of Transportation (Non-Medical): Not on file   Physical Activity:    ??? Days of Exercise per Week: Not on file   ??? Minutes of Exercise per Session: Not on file   Stress:    ??? Feeling of Stress : Not on file   Social Connections:    ??? Frequency of Communication with Friends and Family: Not on file   ??? Frequency of Social Gatherings with Friends and Family: Not on file   ??? Attends Religious Services: Not on file   ???  Active Member of Clubs or Organizations: Not on file   ??? Attends Banker Meetings: Not on file   ??? Marital Status: Not on file   Intimate Partner Violence:    ??? Fear of Current or Ex-Partner: Not on file   ??? Emotionally Abused: Not on file   ??? Physically Abused: Not on file   ??? Sexually Abused: Not on file   Housing Stability:    ??? Unable to Pay for Housing in the Last Year: Not on file   ??? Number of Places Lived in the Last Year: Not on file   ??? Unstable Housing in the Last Year: Not on file         ALLERGIES: Penicillins    Review of Systems   Constitutional: Positive for fatigue. Negative for fever.   HENT: Negative for rhinorrhea.    Respiratory: Negative for shortness of breath.    Cardiovascular: Negative for chest pain.  Gastrointestinal: Negative for abdominal pain.   Genitourinary: Negative for dysuria.   Musculoskeletal: Negative for back pain.   Skin: Negative for wound.   Neurological: Positive for light-headedness. Negative for headaches.   Psychiatric/Behavioral: Negative for confusion.       There were no vitals filed for this visit.         Physical Exam  Vitals and nursing note reviewed. Exam conducted with a chaperone present.   Constitutional:       General: She is not in acute distress.     Appearance: Normal appearance. She is well-developed. She is not ill-appearing, toxic-appearing or diaphoretic.   HENT:      Head: Normocephalic and atraumatic.   Eyes:      Extraocular Movements: Extraocular movements intact.   Cardiovascular:      Rate and Rhythm: Normal rate.      Pulses: Normal pulses.      Heart sounds: Normal heart sounds.   Pulmonary:      Effort: Pulmonary effort is normal. No respiratory distress.   Abdominal:      General: Abdomen is flat. Bowel sounds are normal. There is no distension.      Palpations: Abdomen is soft.      Tenderness: There is abdominal tenderness in the epigastric area.   Musculoskeletal:         General: Normal range of motion.      Cervical back:  Normal range of motion.   Skin:     General: Skin is dry.   Neurological:      Mental Status: She is alert and oriented to person, place, and time.   Psychiatric:         Mood and Affect: Mood normal.          MDM  Number of Diagnoses or Management Options  Hemorrhagic shock (HCC)  Severe anemia  Upper GI bleed  Diagnosis management comments:     75 year old female presents with a chief complaint of black stool. Patient is tachycardic and hypotensive. There is black stool on exam. Patient was given IV fluids with improvement in her blood pressure. Hemoglobin returned significantly low with a hematocrit of around 15. Patient was transfused 4 units of PRBCs. Given the fact that she is on Eliquis she was also given Kcentra after consultation with GI. Patient will be admitted to hospital medicine for further management.    I have discussed with the patient and caregiver the rationale for blood component transfusion; its benefits in treating or preventing fatigue, organ damage, or death; and its risk which includes mild transfusion reactions, rare risk of blood borne infection, or more serious but rare reactions. I have discussed the alternatives to transfusion, including the risk and consequences of not receiving transfusion. The patient and caregiver had an opportunity to ask questions and had agreed to proceed with transfusion of blood components.      Perfect Serve Consult for Admission  11:03 PM    ED Room Number: ER19/19  Patient Name and age:  Savannah Irwin 75 y.o.  female  Working Diagnosis: Hemorrhagic shock (HCC)  (primary encounter diagnosis)  Severe anemia  Upper GI bleed    COVID-19 Suspicion:  no  Sepsis present:  no  Reassessment needed: no  Code Status:  Full Code  Readmission: no  Isolation Requirements:  no  Recommended Level of Care:  ICU  Department:[facility] - [phone]  Other: GI consult pending, 4 units PRBCs ordered.  Blood pressure improving  with IV fluids    Total critical care time  spent exclusive of procedures: 67 minutes             Procedures

## 2021-02-05 ENCOUNTER — Inpatient Hospital Stay
Admit: 2021-02-05 | Discharge: 2021-02-10 | Disposition: A | Discharge status: Home Health | Payer: MEDICARE | Attending: Internal Medicine | Admitting: Internal Medicine

## 2021-02-05 ENCOUNTER — Inpatient Hospital Stay: Admit: 2021-02-05 | Payer: MEDICARE

## 2021-02-05 LAB — CBC WITH AUTOMATED DIFF
ABS. BASOPHILS: 0 10*3/uL (ref 0.0–0.1)
ABS. BASOPHILS: 0.1 10*3/uL (ref 0.0–0.1)
ABS. EOSINOPHILS: 0 10*3/uL (ref 0.0–0.4)
ABS. EOSINOPHILS: 0 10*3/uL (ref 0.0–0.4)
ABS. IMM. GRANS.: 0 10*3/uL
ABS. IMM. GRANS.: 0.2 10*3/uL — ABNORMAL HIGH (ref 0.00–0.04)
ABS. LYMPHOCYTES: 4.9 10*3/uL — ABNORMAL HIGH (ref 0.8–3.5)
ABS. LYMPHOCYTES: 3.5 10*3/uL (ref 0.8–3.5)
ABS. MONOCYTES: 0.8 10*3/uL (ref 0.0–1.0)
ABS. MONOCYTES: 0.8 10*3/uL (ref 0.0–1.0)
ABS. NEUTROPHILS: 8.2 10*3/uL — ABNORMAL HIGH (ref 1.8–8.0)
ABS. NEUTROPHILS: 8.6 10*3/uL — ABNORMAL HIGH (ref 1.8–8.0)
ABSOLUTE NRBC: 0.12 10*3/uL — ABNORMAL HIGH (ref 0.00–0.01)
ABSOLUTE NRBC: 0.12 10*3/uL — ABNORMAL HIGH (ref 0.00–0.01)
BASOPHILS: 0 % (ref 0–1)
BASOPHILS: 1 % (ref 0–1)
EOSINOPHILS: 0 % (ref 0–7)
EOSINOPHILS: 0 % (ref 0–7)
HCT: 15.3 % — CL (ref 35.0–47.0)
HCT: 23.5 % — ABNORMAL LOW (ref 35.0–47.0)
HGB: 4.8 g/dL — CL (ref 11.5–16.0)
HGB: 7.6 g/dL — ABNORMAL LOW (ref 11.5–16.0)
IMMATURE GRANULOCYTES: 0 %
IMMATURE GRANULOCYTES: 1 % — ABNORMAL HIGH (ref 0.0–0.5)
LYMPHOCYTES: 35 % (ref 12–49)
LYMPHOCYTES: 27 % (ref 12–49)
MCH: 29.8 PG (ref 26.0–34.0)
MCH: 30.4 PG (ref 26.0–34.0)
MCHC: 31.4 g/dL (ref 30.0–36.5)
MCHC: 32.3 g/dL (ref 30.0–36.5)
MCV: 95 FL (ref 80.0–99.0)
MCV: 94 FL (ref 80.0–99.0)
MONOCYTES: 6 % (ref 5–13)
MONOCYTES: 6 % (ref 5–13)
MPV: 9.4 FL (ref 8.9–12.9)
MPV: 9.1 FL (ref 8.9–12.9)
NEUTROPHILS: 59 % (ref 32–75)
NEUTROPHILS: 65 % (ref 32–75)
NRBC: 0.9 PER 100 WBC — ABNORMAL HIGH
NRBC: 0.9 PER 100 WBC — ABNORMAL HIGH
PLATELET: 318 10*3/uL (ref 150–400)
PLATELET: 263 10*3/uL (ref 150–400)
RBC: 1.61 M/uL — ABNORMAL LOW (ref 3.80–5.20)
RBC: 2.5 M/uL — ABNORMAL LOW (ref 3.80–5.20)
RDW: 18.9 % — ABNORMAL HIGH (ref 11.5–14.5)
RDW: 15.7 % — ABNORMAL HIGH (ref 11.5–14.5)
WBC: 13.9 10*3/uL — ABNORMAL HIGH (ref 3.6–11.0)
WBC: 13.2 10*3/uL — ABNORMAL HIGH (ref 3.6–11.0)

## 2021-02-05 LAB — URINALYSIS W/ REFLEX CULTURE
BACTERIA, URINE: NEGATIVE /hpf
Bacteria: NEGATIVE /hpf
Bilirubin, Urine: NEGATIVE
Bilirubin: NEGATIVE
Blood, Urine: NEGATIVE
Blood: NEGATIVE
Glucose, Ur: NEGATIVE mg/dL
Glucose: NEGATIVE mg/dL
Ketone: NEGATIVE mg/dL
Ketones, Urine: NEGATIVE mg/dL
Leukocyte Esterase, Urine: NEGATIVE
Leukocyte Esterase: NEGATIVE
Nitrite, Urine: NEGATIVE
Nitrites: NEGATIVE
Protein, UA: NEGATIVE mg/dL
Protein: NEGATIVE mg/dL
Specific Gravity, UA: 1.025 (ref 1.003–1.030)
Specific gravity: 1.025 (ref 1.003–1.030)
Urobilinogen, UA, POCT: 0.2 EU/dL (ref 0.2–1.0)
Urobilinogen: 0.2 EU/dL (ref 0.2–1.0)
pH (UA): 5.5 (ref 5.0–8.0)
pH, UA: 5.5 (ref 5.0–8.0)

## 2021-02-05 LAB — RBC, ALLOCATE

## 2021-02-05 LAB — PROTHROMBIN TIME + INR
INR: 1.2 — ABNORMAL HIGH (ref 0.9–1.1)
Prothrombin time: 12.8 s — ABNORMAL HIGH (ref 9.0–11.1)

## 2021-02-05 LAB — NT-PRO BNP: NT pro-BNP: 1035 PG/ML — ABNORMAL HIGH (ref <450)

## 2021-02-05 LAB — METABOLIC PANEL, COMPREHENSIVE
A-G Ratio: 0.6 — ABNORMAL LOW (ref 1.1–2.2)
A-G Ratio: 0.6 — ABNORMAL LOW (ref 1.1–2.2)
ALT (SGPT): 22 U/L (ref 12–78)
ALT (SGPT): 20 U/L (ref 12–78)
AST (SGOT): 17 U/L (ref 15–37)
AST (SGOT): 14 U/L — ABNORMAL LOW (ref 15–37)
Albumin: 2.3 g/dL — ABNORMAL LOW (ref 3.5–5.0)
Albumin: 2.2 g/dL — ABNORMAL LOW (ref 3.5–5.0)
Alk. phosphatase: 48 U/L (ref 45–117)
Alk. phosphatase: 45 U/L (ref 45–117)
Anion gap: 10 mmol/L (ref 5–15)
Anion gap: 6 mmol/L (ref 5–15)
BUN/Creatinine ratio: 61 — ABNORMAL HIGH (ref 12–20)
BUN/Creatinine ratio: 62 — ABNORMAL HIGH (ref 12–20)
BUN: 82 MG/DL — ABNORMAL HIGH (ref 6–20)
BUN: 66 MG/DL — ABNORMAL HIGH (ref 6–20)
Bilirubin, total: 0.2 MG/DL (ref 0.2–1.0)
Bilirubin, total: 0.4 MG/DL (ref 0.2–1.0)
CO2: 17 mmol/L — ABNORMAL LOW (ref 21–32)
CO2: 20 mmol/L — ABNORMAL LOW (ref 21–32)
Calcium: 7.9 MG/DL — ABNORMAL LOW (ref 8.5–10.1)
Calcium: 7.6 MG/DL — ABNORMAL LOW (ref 8.5–10.1)
Chloride: 109 mmol/L — ABNORMAL HIGH (ref 97–108)
Chloride: 112 mmol/L — ABNORMAL HIGH (ref 97–108)
Creatinine: 1.34 MG/DL — ABNORMAL HIGH (ref 0.55–1.02)
Creatinine: 1.07 MG/DL — ABNORMAL HIGH (ref 0.55–1.02)
GFR est AA: 47 mL/min/{1.73_m2} — ABNORMAL LOW (ref >60)
GFR est AA: >60 mL/min/{1.73_m2} (ref >60)
GFR est non-AA: 39 mL/min/{1.73_m2} — ABNORMAL LOW (ref >60)
GFR est non-AA: 50 mL/min/{1.73_m2} — ABNORMAL LOW (ref >60)
Globulin: 3.6 g/dL (ref 2.0–4.0)
Globulin: 3.5 g/dL (ref 2.0–4.0)
Glucose: 286 mg/dL — ABNORMAL HIGH (ref 65–100)
Glucose: 196 mg/dL — ABNORMAL HIGH (ref 65–100)
Potassium: 5.2 mmol/L — ABNORMAL HIGH (ref 3.5–5.1)
Potassium: 4.5 mmol/L (ref 3.5–5.1)
Protein, total: 5.9 g/dL — ABNORMAL LOW (ref 6.4–8.2)
Protein, total: 5.7 g/dL — ABNORMAL LOW (ref 6.4–8.2)
Sodium: 136 mmol/L (ref 136–145)
Sodium: 138 mmol/L (ref 136–145)

## 2021-02-05 LAB — OCCULT BLOOD, STOOL: Occult blood, stool: POSITIVE — AB

## 2021-02-05 LAB — PHOSPHORUS
Phosphorus: 3.2 MG/DL (ref 2.6–4.7)
Phosphorus: 3.2 MG/DL (ref 2.6–4.7)

## 2021-02-05 LAB — HGB & HCT
HCT: 25.6 % — ABNORMAL LOW (ref 35.0–47.0)
HGB: 8.6 g/dL — ABNORMAL LOW (ref 11.5–16.0)

## 2021-02-05 LAB — MAGNESIUM
Magnesium: 1.6 mg/dL (ref 1.6–2.4)
Magnesium: 1.6 mg/dL (ref 1.6–2.4)

## 2021-02-05 LAB — LIPASE
Lipase: 23 U/L — ABNORMAL LOW (ref 73–393)
Lipase: 23 U/L — ABNORMAL LOW (ref 73–393)
Lipase: 25 U/L — ABNORMAL LOW (ref 73–393)
Lipase: 25 U/L — ABNORMAL LOW (ref 73–393)

## 2021-02-05 LAB — HEMOGLOBIN A1C WITH EAG
Est. average glucose: 134 mg/dL
Hemoglobin A1c: 6.3 % — ABNORMAL HIGH (ref 4.0–5.6)

## 2021-02-05 LAB — LACTIC ACID
Lactic Acid: 4.9 MMOL/L (ref 0.4–2.0)
Lactic Acid: 1.3 MMOL/L (ref 0.4–2.0)
Lactic acid: 4.9 MMOL/L — CR (ref 0.4–2.0)
Lactic acid: 1.3 MMOL/L (ref 0.4–2.0)

## 2021-02-05 LAB — HEMOGLOBIN A1C W/EAG
Hemoglobin A1C: 6.3 % — ABNORMAL HIGH (ref 4.0–5.6)
eAG: 134 mg/dL

## 2021-02-05 LAB — CBC WITH AUTO DIFFERENTIAL
Basophils %: 0 % (ref 0–1)
Basophils %: 1 % (ref 0–1)
Basophils Absolute: 0 10*3/uL (ref 0.0–0.1)
Basophils Absolute: 0.1 10*3/uL (ref 0.0–0.1)
Eosinophils %: 0 % (ref 0–7)
Eosinophils %: 0 % (ref 0–7)
Eosinophils Absolute: 0 10*3/uL (ref 0.0–0.4)
Eosinophils Absolute: 0 10*3/uL (ref 0.0–0.4)
Granulocyte Absolute Count: 0 10*3/uL
Granulocyte Absolute Count: 0.2 10*3/uL — ABNORMAL HIGH (ref 0.00–0.04)
Hematocrit: 15.3 % — CL (ref 35.0–47.0)
Hematocrit: 23.5 % — ABNORMAL LOW (ref 35.0–47.0)
Hemoglobin: 4.8 g/dL — CL (ref 11.5–16.0)
Hemoglobin: 7.6 g/dL — ABNORMAL LOW (ref 11.5–16.0)
Immature Granulocytes: 0 %
Immature Granulocytes: 1 % — ABNORMAL HIGH (ref 0.0–0.5)
Lymphocytes %: 35 % (ref 12–49)
Lymphocytes %: 27 % (ref 12–49)
Lymphocytes Absolute: 4.9 10*3/uL — ABNORMAL HIGH (ref 0.8–3.5)
Lymphocytes Absolute: 3.5 10*3/uL (ref 0.8–3.5)
MCH: 29.8 PG (ref 26.0–34.0)
MCH: 30.4 PG (ref 26.0–34.0)
MCHC: 31.4 g/dL (ref 30.0–36.5)
MCHC: 32.3 g/dL (ref 30.0–36.5)
MCV: 95 FL (ref 80.0–99.0)
MCV: 94 FL (ref 80.0–99.0)
MPV: 9.4 FL (ref 8.9–12.9)
MPV: 9.1 FL (ref 8.9–12.9)
Monocytes %: 6 % (ref 5–13)
Monocytes %: 6 % (ref 5–13)
Monocytes Absolute: 0.8 10*3/uL (ref 0.0–1.0)
Monocytes Absolute: 0.8 10*3/uL (ref 0.0–1.0)
NRBC Absolute: 0.12 10*3/uL — ABNORMAL HIGH (ref 0.00–0.01)
NRBC Absolute: 0.12 10*3/uL — ABNORMAL HIGH (ref 0.00–0.01)
Neutrophils %: 59 % (ref 32–75)
Neutrophils %: 65 % (ref 32–75)
Neutrophils Absolute: 8.2 10*3/uL — ABNORMAL HIGH (ref 1.8–8.0)
Neutrophils Absolute: 8.6 10*3/uL — ABNORMAL HIGH (ref 1.8–8.0)
Nucleated RBCs: 0.9 PER 100 WBC — ABNORMAL HIGH
Nucleated RBCs: 0.9 PER 100 WBC — ABNORMAL HIGH
Platelets: 318 10*3/uL (ref 150–400)
Platelets: 263 10*3/uL (ref 150–400)
RBC: 1.61 M/uL — ABNORMAL LOW (ref 3.80–5.20)
RBC: 2.5 M/uL — ABNORMAL LOW (ref 3.80–5.20)
RDW: 18.9 % — ABNORMAL HIGH (ref 11.5–14.5)
RDW: 15.7 % — ABNORMAL HIGH (ref 11.5–14.5)
WBC: 13.9 10*3/uL — ABNORMAL HIGH (ref 3.6–11.0)
WBC: 13.2 10*3/uL — ABNORMAL HIGH (ref 3.6–11.0)

## 2021-02-05 LAB — COMPREHENSIVE METABOLIC PANEL
ALT: 22 U/L (ref 12–78)
ALT: 20 U/L (ref 12–78)
AST: 17 U/L (ref 15–37)
AST: 14 U/L — ABNORMAL LOW (ref 15–37)
Albumin/Globulin Ratio: 0.6 — ABNORMAL LOW (ref 1.1–2.2)
Albumin/Globulin Ratio: 0.6 — ABNORMAL LOW (ref 1.1–2.2)
Albumin: 2.3 g/dL — ABNORMAL LOW (ref 3.5–5.0)
Albumin: 2.2 g/dL — ABNORMAL LOW (ref 3.5–5.0)
Alkaline Phosphatase: 48 U/L (ref 45–117)
Alkaline Phosphatase: 45 U/L (ref 45–117)
Anion Gap: 10 mmol/L (ref 5–15)
Anion Gap: 6 mmol/L (ref 5–15)
BUN: 82 MG/DL — ABNORMAL HIGH (ref 6–20)
BUN: 66 MG/DL — ABNORMAL HIGH (ref 6–20)
Bun/Cre Ratio: 61 — ABNORMAL HIGH (ref 12–20)
Bun/Cre Ratio: 62 — ABNORMAL HIGH (ref 12–20)
CO2: 17 mmol/L — ABNORMAL LOW (ref 21–32)
CO2: 20 mmol/L — ABNORMAL LOW (ref 21–32)
Calcium: 7.9 MG/DL — ABNORMAL LOW (ref 8.5–10.1)
Calcium: 7.6 MG/DL — ABNORMAL LOW (ref 8.5–10.1)
Chloride: 109 mmol/L — ABNORMAL HIGH (ref 97–108)
Chloride: 112 mmol/L — ABNORMAL HIGH (ref 97–108)
Creatinine: 1.34 MG/DL — ABNORMAL HIGH (ref 0.55–1.02)
Creatinine: 1.07 MG/DL — ABNORMAL HIGH (ref 0.55–1.02)
EGFR IF NonAfrican American: 39 mL/min/{1.73_m2} — ABNORMAL LOW (ref >60)
EGFR IF NonAfrican American: 50 mL/min/{1.73_m2} — ABNORMAL LOW (ref >60)
GFR African American: 47 mL/min/{1.73_m2} — ABNORMAL LOW (ref >60)
GFR African American: >60 mL/min/{1.73_m2} (ref >60)
Globulin: 3.6 g/dL (ref 2.0–4.0)
Globulin: 3.5 g/dL (ref 2.0–4.0)
Glucose: 286 mg/dL — ABNORMAL HIGH (ref 65–100)
Glucose: 196 mg/dL — ABNORMAL HIGH (ref 65–100)
Potassium: 5.2 mmol/L — ABNORMAL HIGH (ref 3.5–5.1)
Potassium: 4.5 mmol/L (ref 3.5–5.1)
Sodium: 136 mmol/L (ref 136–145)
Sodium: 138 mmol/L (ref 136–145)
Total Bilirubin: 0.2 MG/DL (ref 0.2–1.0)
Total Bilirubin: 0.4 MG/DL (ref 0.2–1.0)
Total Protein: 5.9 g/dL — ABNORMAL LOW (ref 6.4–8.2)
Total Protein: 5.7 g/dL — ABNORMAL LOW (ref 6.4–8.2)

## 2021-02-05 LAB — OCCULT BLOOD, FECAL: Occult Blood Fecal: POSITIVE — AB

## 2021-02-05 LAB — PROTIME-INR
INR: 1.2 — ABNORMAL HIGH (ref 0.9–1.1)
Protime: 12.8 s — ABNORMAL HIGH (ref 9.0–11.1)

## 2021-02-05 LAB — HEMOGLOBIN AND HEMATOCRIT
Hematocrit: 25.6 % — ABNORMAL LOW (ref 35.0–47.0)
Hemoglobin: 8.6 g/dL — ABNORMAL LOW (ref 11.5–16.0)

## 2021-02-05 LAB — PROBNP, N-TERMINAL: BNP: 1035 PG/ML — ABNORMAL HIGH (ref <450)

## 2021-02-05 MED ORDER — PROCHLORPERAZINE EDISYLATE 5 MG/ML INJECTION
5 mg/mL | INTRAMUSCULAR | Status: AC
Start: 2021-02-05 — End: 2021-02-04
  Administered 2021-02-05: 04:00:00 via INTRAVENOUS

## 2021-02-05 MED ORDER — SODIUM CHLORIDE 0.9% BOLUS IV
0.9 % | Freq: Once | INTRAVENOUS | Status: AC
Start: 2021-02-05 — End: 2021-02-04
  Administered 2021-02-05: 04:00:00 via INTRAVENOUS

## 2021-02-05 MED ORDER — SODIUM CHLORIDE 0.9% BOLUS IV
0.9 % | Freq: Once | INTRAVENOUS | Status: AC
Start: 2021-02-05 — End: 2021-02-04
  Administered 2021-02-05: 03:00:00 via INTRAVENOUS

## 2021-02-05 MED ORDER — POLYETHYLENE GLYCOL 3350 17 GRAM (100 %) ORAL POWDER PACKET
17 gram | Freq: Every day | ORAL | Status: DC | PRN
Start: 2021-02-05 — End: 2021-02-10

## 2021-02-05 MED ORDER — ONDANSETRON (PF) 4 MG/2 ML INJECTION
4 mg/2 mL | Freq: Four times a day (QID) | INTRAMUSCULAR | Status: DC | PRN
Start: 2021-02-05 — End: 2021-02-05

## 2021-02-05 MED ORDER — ONDANSETRON (PF) 4 MG/2 ML INJECTION
4 mg/2 mL | INTRAMUSCULAR | Status: AC
Start: 2021-02-05 — End: 2021-02-04

## 2021-02-05 MED ORDER — PHENYLEPHRINE 10 MG/ML INJECTION
10 mg/mL | INTRAMUSCULAR | Status: DC
Start: 2021-02-05 — End: 2021-02-07
  Administered 2021-02-05 – 2021-02-06 (×10): via INTRAVENOUS

## 2021-02-05 MED ORDER — LACTATED RINGERS IV
INTRAVENOUS | Status: DC
Start: 2021-02-05 — End: 2021-02-07
  Administered 2021-02-05 – 2021-02-07 (×3): via INTRAVENOUS

## 2021-02-05 MED ORDER — SODIUM CHLORIDE 0.9 % INJECTION
40 mg | Freq: Once | INTRAMUSCULAR | Status: AC
Start: 2021-02-05 — End: 2021-02-05
  Administered 2021-02-05: 06:00:00 via INTRAVENOUS

## 2021-02-05 MED ORDER — ONDANSETRON 4 MG TAB, RAPID DISSOLVE
4 mg | Freq: Three times a day (TID) | ORAL | Status: DC | PRN
Start: 2021-02-05 — End: 2021-02-05

## 2021-02-05 MED ORDER — SODIUM CHLORIDE 0.9 % IJ SYRG
INTRAMUSCULAR | Status: DC | PRN
Start: 2021-02-05 — End: 2021-02-10

## 2021-02-05 MED ORDER — PANTOPRAZOLE 40 MG IV SOLR
40 mg | Freq: Two times a day (BID) | INTRAVENOUS | Status: DC
Start: 2021-02-05 — End: 2021-02-07
  Administered 2021-02-05 – 2021-02-07 (×5): via INTRAVENOUS

## 2021-02-05 MED ORDER — SODIUM CHLORIDE 0.9 % IV
INTRAVENOUS | Status: DC | PRN
Start: 2021-02-05 — End: 2021-02-10

## 2021-02-05 MED ORDER — HUMAN PROTHROMBIN COMPLEX (PCC) 4FACTOR 500 UNIT (400-620 UNIT) IV SOLUTION
500400-620 unit (400-620 unit) | Freq: Once | INTRAVENOUS | Status: AC
Start: 2021-02-05 — End: 2021-02-05
  Administered 2021-02-05: 05:00:00 via INTRAVENOUS

## 2021-02-05 MED ORDER — SODIUM CHLORIDE 0.9 % IV
INTRAVENOUS | Status: DC
Start: 2021-02-05 — End: 2021-02-05

## 2021-02-05 MED ORDER — METRONIDAZOLE IN SODIUM CHLORIDE (ISO-OSM) 500 MG/100 ML IV PIGGY BACK
500 mg/100 mL | Freq: Two times a day (BID) | INTRAVENOUS | Status: DC
Start: 2021-02-05 — End: 2021-02-07
  Administered 2021-02-05 – 2021-02-07 (×4): via INTRAVENOUS

## 2021-02-05 MED ORDER — DEXTROSE 5% IN NORMAL SALINE IV
INTRAVENOUS | Status: DC
Start: 2021-02-05 — End: 2021-02-05
  Administered 2021-02-05 (×2): via INTRAVENOUS

## 2021-02-05 MED ORDER — LEVOFLOXACIN IN D5W 750 MG/150 ML IV PIGGY BACK
750 mg/150 mL | INTRAVENOUS | Status: DC
Start: 2021-02-05 — End: 2021-02-07
  Administered 2021-02-05: 21:00:00 via INTRAVENOUS

## 2021-02-05 MED ORDER — IOPAMIDOL 76 % IV SOLN
76 % | Freq: Once | INTRAVENOUS | Status: AC
Start: 2021-02-05 — End: 2021-02-04
  Administered 2021-02-05: 05:00:00 via INTRAVENOUS

## 2021-02-05 MED ORDER — ONDANSETRON (PF) 4 MG/2 ML INJECTION
4 mg/2 mL | INTRAMUSCULAR | Status: DC | PRN
Start: 2021-02-05 — End: 2021-02-10

## 2021-02-05 MED ORDER — SODIUM CHLORIDE 0.9 % IJ SYRG
Freq: Three times a day (TID) | INTRAMUSCULAR | Status: DC
Start: 2021-02-05 — End: 2021-02-10
  Administered 2021-02-05 – 2021-02-10 (×17): via INTRAVENOUS

## 2021-02-05 MED ORDER — ACETAMINOPHEN 650 MG RECTAL SUPPOSITORY
650 mg | Freq: Four times a day (QID) | RECTAL | Status: DC | PRN
Start: 2021-02-05 — End: 2021-02-10

## 2021-02-05 MED ORDER — ACETAMINOPHEN 325 MG TABLET
325 mg | Freq: Four times a day (QID) | ORAL | Status: DC | PRN
Start: 2021-02-05 — End: 2021-02-10
  Administered 2021-02-07 – 2021-02-10 (×5): via ORAL

## 2021-02-05 MED ORDER — SODIUM CHLORIDE 0.9 % IV
500 mcg/mL | Freq: Once | INTRAVENOUS | Status: AC
Start: 2021-02-05 — End: 2021-02-05
  Administered 2021-02-05: 09:00:00 via INTRAVENOUS

## 2021-02-05 MED ORDER — ONDANSETRON (PF) 4 MG/2 ML INJECTION
4 mg/2 mL | INTRAMUSCULAR | Status: AC
Start: 2021-02-05 — End: 2021-02-04
  Administered 2021-02-05: 04:00:00 via INTRAVENOUS

## 2021-02-05 MED FILL — SODIUM CHLORIDE 0.9 % IV: INTRAVENOUS | Qty: 1000

## 2021-02-05 MED FILL — PROTONIX 40 MG INTRAVENOUS SOLUTION: 40 mg | INTRAVENOUS | Qty: 80

## 2021-02-05 MED FILL — VAZCULEP 10 MG/ML INJECTION SOLUTION: 10 mg/mL | INTRAMUSCULAR | Qty: 30

## 2021-02-05 MED FILL — BD POSIFLUSH NORMAL SALINE 0.9 % INJECTION SYRINGE: INTRAMUSCULAR | Qty: 40

## 2021-02-05 MED FILL — METRONIDAZOLE IN SODIUM CHLORIDE (ISO-OSM) 500 MG/100 ML IV PIGGY BACK: 500 mg/100 mL | INTRAVENOUS | Qty: 100

## 2021-02-05 MED FILL — ISOVUE-370  76 % INTRAVENOUS SOLUTION: 370 mg iodine /mL (76 %) | INTRAVENOUS | Qty: 100

## 2021-02-05 MED FILL — LEVOFLOXACIN IN D5W 750 MG/150 ML IV PIGGY BACK: 750 mg/150 mL | INTRAVENOUS | Qty: 150

## 2021-02-05 MED FILL — SODIUM CHLORIDE 0.9 % IV: INTRAVENOUS | Qty: 250

## 2021-02-05 MED FILL — LACTATED RINGERS IV: INTRAVENOUS | Qty: 1000

## 2021-02-05 MED FILL — PROCHLORPERAZINE EDISYLATE 5 MG/ML INJECTION: 5 mg/mL | INTRAMUSCULAR | Qty: 2

## 2021-02-05 MED FILL — KCENTRA 500 UNIT (400-620 UNIT) INTRAVENOUS SOLUTION: 500 unit (400-620 unit) | INTRAVENOUS | Qty: 2790

## 2021-02-05 MED FILL — OCTREOTIDE ACETATE 500 MCG/ML INJECTION: 500 mcg/mL | INTRAMUSCULAR | Qty: 1

## 2021-02-05 MED FILL — PROTONIX 40 MG INTRAVENOUS SOLUTION: 40 mg | INTRAVENOUS | Qty: 40

## 2021-02-05 MED FILL — ONDANSETRON (PF) 4 MG/2 ML INJECTION: 4 mg/2 mL | INTRAMUSCULAR | Qty: 2

## 2021-02-05 MED FILL — DEXTROSE 5% IN NORMAL SALINE IV: INTRAVENOUS | Qty: 1000

## 2021-02-05 NOTE — Progress Notes (Signed)
Problem: Pressure Injury - Risk of  Goal: *Prevention of pressure injury  Description: Document Braden Scale and appropriate interventions in the flowsheet.  Outcome: Progressing Towards Goal  Note: Pressure Injury Interventions:  Sensory Interventions: Assess changes in LOC,Assess need for specialty bed,Avoid rigorous massage over bony prominences,Check visual cues for pain,Discuss PT/OT consult with provider,Float heels,Keep linens dry and wrinkle-free,Maintain/enhance activity level,Minimize linen layers,Monitor skin under medical devices,Pad between skin to skin,Pressure redistribution bed/mattress (bed type),Turn and reposition approx. every two hours (pillows and wedges if needed),Use 30-degree side-lying position    Moisture Interventions: Absorbent underpads,Apply protective barrier, creams and emollients,Assess need for specialty bed,Check for incontinence Q2 hours and as needed,Contain wound drainage,Internal/External urinary devices,Maintain skin hydration (lotion/cream),Minimize layers,Moisture barrier,Offer toileting Q_hr    Activity Interventions: Assess need for specialty bed,Chair cushion,Increase time out of bed,Pressure redistribution bed/mattress(bed type),PT/OT evaluation    Mobility Interventions: Assess need for specialty bed,Chair cushion,Float heels,HOB 30 degrees or less,Pressure redistribution bed/mattress (bed type),PT/OT evaluation,Suspension boots,Turn and reposition approx. every two hours(pillow and wedges)    Nutrition Interventions: Document food/fluid/supplement intake,Discuss nutritional consult with provider    Friction and Shear Interventions: Apply protective barrier, creams and emollients,Feet elevated on foot rest,Foam dressings/transparent film/skin sealants,HOB 30 degrees or less,Lift sheet,Lift team/patient mobility team,Minimize layers,Transferring/repositioning devices                Problem: Patient Education: Go to Patient Education Activity  Goal: Patient/Family  Education  Outcome: Progressing Towards Goal     Problem: Falls - Risk of  Goal: *Absence of Falls  Description: Document Schmid Fall Risk and appropriate interventions in the flowsheet.  Outcome: Progressing Towards Goal  Note: Fall Risk Interventions:       Mentation Interventions: Adequate sleep, hydration, pain control,Door open when patient unattended,Bed/chair exit alarm,Evaluate medications/consider consulting pharmacy,Familiar objects from home,Gait belt with transfers/ambulation,More frequent rounding,Increase mobility,Reorient patient,Room close to nurse's station,Toileting rounds,Update white board    Medication Interventions: Assess postural VS orthostatic hypotension,Bed/chair exit alarm,Evaluate medications/consider consulting pharmacy,Patient to call before getting OOB,Utilize gait belt for transfers/ambulation,Teach patient to arise slowly    Elimination Interventions: Bed/chair exit alarm,Call light in reach,Elevated toilet seat,Patient to call for help with toileting needs,Stay With Me (per policy),Toilet paper/wipes in reach,Toileting schedule/hourly rounds              Problem: Patient Education: Go to Patient Education Activity  Goal: Patient/Family Education  Outcome: Progressing Towards Goal

## 2021-02-05 NOTE — Progress Notes (Signed)
Spiritual Care Assessment/Progress Note  [facility]      NAME: Savannah Irwin      MRN: [id number]  AGE: 75 y.o. SEX: female  Religious Affiliation:    Language: English     02/05/2021     Total Time (in minutes): 20     Spiritual Assessment begun in [facility] CARE through conversation with:         [x] Patient        []  Family    []  Friend(s)        Reason for Consult: Initial/Spiritual assessment, critical care     Spiritual beliefs: (Please include comment if needed)     [x]  Identifies with a faith tradition: Christian        []  Supported by a faith community:            []  Claims no spiritual orientation:           []  Seeking spiritual identity:                []  Adheres to an individual form of spirituality:           []  Not able to assess:                           Identified resources for coping:      [x]  Prayer                               []  Music                  []  Guided Imagery     [x]  Family/friends                 []  Pet visits     []  Devotional reading                         []  Unknown     []  Other:                                               Interventions offered during this visit: (See comments for more details)    Patient Interventions: Affirmation of emotions/emotional suffering,Affirmation of faith,Catharsis/review of pertinent events in supportive environment,Iconic (affirming the presence of God/Higher Power),Normalization of emotional/spiritual concerns,Prayer (assurance of)           Plan of Care:     []  Support spiritual and/or cultural needs    []  Support AMD and/or advance care planning process      []  Support grieving process   []  Coordinate Rites and/or Rituals    []  Coordination with community clergy   []  No spiritual needs identified at this time   []  Detailed Plan of Care below (See Comments)  []  Make referral to Music Therapy  []  Make referral to Pet Therapy     []  Make referral to Addiction services  []  Make referral to [facility] Passages  []  Make referral to  Spiritual Care Partner  []  No future visits requested        [x]  Follow up visits as needed     Visited patient for initial spiritual assessment. She spoke of her pain and inability to eat  because of that pain. She is from [place], her niece brought her to the area to provide care for her. She is unaware if her niece or any family will visit today. Patient's chart was consulted.   Clancy Gourd, Chaplain, MDiv, MS, [facility]

## 2021-02-05 NOTE — Consults (Signed)
Consults by Marisa HuaKhalid,  Victor Langenbach, MD at 02/05/21 1209                Author: Marisa HuaKhalid, Jamisha Hoeschen, MD  Service: Gastroenterology  Author Type: Physician       Filed: 02/05/21 1212  Date of Service: 02/05/21 1209  Status: Signed          Editor: Marisa HuaKhalid, Jalia Zuniga, MD (Physician)            Consult Orders        1. IP CONSULT TO GASTROENTEROLOGY [[id number]][[id number]] ordered by Harlen Labsumas, Robert, MD at 02/04/21 2255                                 [place] [facility]   Marisa Huamer Ladene Allocca, M.D.   [phone]                       GASTROENTEROLOGY CONSULTATION NOTE                      NAME:  Savannah AmmonsSara Pedone    DOB:   [birth year]    MRN:   [id number]          Referring Physician:     Dr. Welton FlakesKhan        Consult Date:    02/05/2021 12:09 PM      Chief Complaint:     Anemia       History of Present Illness:     Ms. Isaac BlissMcClure is a 75 year old woman presents with melena.  She had recently been admitted to a hospital in [place] with pulmonary embolus and started  on anticoagulation, Eliquis.     No prior history of gastrointestinal bleeding.  She has been getting weak progressively disoriented with symptoms of nausea vomiting abdominal pain chills. The patient deteriorated further with weakness, nausea, vomiting,  melena as well as abdominal pain.  The patient was then brought to the hospital where she was found to be extremely anemic.  The hemoglobin of 4.8, as was hypotensive.  No respiratory distress.   She does complain of epigastric tenderness that has been ongoing.  She does take NSAIDs on a very frequent basis   No further melena noted.   She has been given 3 units of packed RBCs         PMH:     Past Medical History:        Diagnosis  Date         ?  Acquired hypothyroidism       ?  Emphysema lung (HCC)       ?  Fibromyalgia       ?  Gout       ?  Graves' disease       ?  Heart failure with preserved ejection fraction (HCC)       ?  Pulmonary hypertension [facility](HCC)            [facility], patient has a history  of severe pulmonary hypertension as admission of February, 2022.  Previously,  [facility] showed mild pulmonary hypertension, normal calculated LVEF of 63%, mild tricuspid regurgitation and trace mitral regurgitation.         ?  Type 2 diabetes mellitus (HCC)             PSH:  Past Surgical History:         Procedure  Laterality  Date          ?  HX CHOLECYSTECTOMY    1972          ?  HX HYSTERECTOMY               Allergies:     Allergies        Allergen  Reactions         ?  Penicillins  Itching           Home Medications:     None           Hospital Medications:     Current Facility-Administered Medications          Medication  Dose  Route  Frequency           ?  sodium chloride (NS) flush 5-40 mL   5-40 mL  IntraVENous  Q8H     ?  sodium chloride (NS) flush 5-40 mL   5-40 mL  IntraVENous  PRN     ?  acetaminophen (TYLENOL) tablet 650 mg   650 mg  Oral  Q6H PRN          Or           ?  acetaminophen (TYLENOL) suppository 650 mg   650 mg  Rectal  Q6H PRN     ?  polyethylene glycol (MIRALAX) packet 17 g   17 g  Oral  DAILY PRN     ?  dextrose 5% and 0.9% NaCl infusion   125 mL/hr  IntraVENous  CONTINUOUS     ?  ondansetron (ZOFRAN) injection 4 mg   4 mg  IntraVENous  Q4H PRN     ?  pantoprazole (PROTONIX) 40 mg in 0.9% sodium chloride 10 mL injection   40 mg  IntraVENous  BID     ?  PHENYLephrine (NEO-SYNEPHRINE) 30 mg in 0.9% sodium chloride 250 mL infusion   10-300 mcg/min  IntraVENous  TITRATE           ?  0.9% sodium chloride infusion 250 mL   250 mL  IntraVENous  PRN           ?  0.9% sodium chloride infusion 250 mL   250 mL  IntraVENous  PRN           Social History:     Social History          Tobacco Use         ?  Smoking status:  Current Every Day Smoker              Packs/day:  0.50         ?  Smokeless tobacco:  Never Used        ?  Tobacco comment: Patient stated that she started smoking at the age of 21.       Substance Use Topics         ?  Alcohol  use:  Not Currently             Comment: Patient denies any history of excessive alcohol intake.           Family History:     Family History         Problem  Relation  Age of Onset          ?  OSTEOARTHRITIS  Mother       ?  Diabetes  Mother       ?  Hypertension  Mother       ?  Elevated Lipids  Sister       ?  Hypertension  Sister       ?  Thyroid Disease  Sister                Luiz Blare' disease           Review of Systems:   A complete 12-point ROS was obtained and is as per the above HPI otherwise negative           Objective:        Patient Vitals for the past 8 hrs:            BP  Temp  Pulse  Resp  SpO2            02/05/21 0915  111/60  --  96  29  94 %            02/05/21 0900  (!) 107/59  --  96  29  93 %     02/05/21 0845  113/63  --  95  25  95 %     02/05/21 0830  (!) 98/50  --  97  16  93 %     02/05/21 0815  (!) 103/50  --  96  18  94 %     02/05/21 0800  (!) 102/56  --  94  15  93 %     02/05/21 0745  (!) 103/59  --  98  21  94 %     02/05/21 0730  (!) 99/57  --  95  22  94 %     02/05/21 0715  (!) 96/55  --  94  19  94 %     02/05/21 0700  (!) 91/51  --  93  20  95 %     02/05/21 0645  (!) 108/56  --  94  19  94 %     02/05/21 0630  (!) 92/51  --  94  21  96 %     02/05/21 0615  (!) 91/54  --  94  21  96 %     02/05/21 0600  (!) 110/54  --  99  19  95 %     02/05/21 0545  (!) 109/58  --  98  13  96 %     02/05/21 0530  (!) 99/56  --  95  21  95 %     02/05/21 0515  (!) 97/52  --  96  13  95 %     02/05/21 0500  (!) 97/54  98.2 ??F (36.8 ??C)  98  22  94 %     02/05/21 0441  94/61  98.3 ??F (36.8 ??C)  94  16  94 %     02/05/21 0430  116/66  --  95  18  96 %            02/05/21 0415  105/62  --  100  18  93 %        02/27 0701 - 02/27 1900   In: -    Out: 175 [Urine:175]   02/25 1901 - 02/27 0700   In: 789.2 [I.V.:194.2]   Out: 300 [Urine:300]      EXAM:      NEURO-alert, oriented x3, affect appropriate, no focal neurological deficits,  moves all extremities well, no involuntary movements, reflexes at knee  and ankle intact    HEENT-Head: Normocephalic, no lesions, without obvious abnormality.    LUNGS-clear to auscultation bilaterally     COR-regular rate and rhythym      ABD- soft, non-tender. Bowel sounds normal. No masses,  no organomegaly      EXT-no edema     Skin - No rash       Data Review         Recent Labs              02/05/21   0953  02/05/21   0402  02/04/21   2222  02/04/21   2222     WBC   --   13.2*   --   13.9*     HGB  8.6*  7.6*    < >  4.8*     HCT  25.6*  23.5*    < >  15.3*     PLT   --   263   --   318        < > = values in this interval not displayed.          Recent Labs            02/05/21   0402  02/04/21   2222     NA  138  136     K  4.5  5.2*     CL  112*  109*     CO2  20*  17*     BUN  66*  82*     CREA  1.07*  1.34*     GLU  196*  286*     PHOS   --   3.2         CA  7.6*  7.9*          Recent Labs            02/05/21   0402  02/04/21   2222     AP  45  48     TP  5.7*  5.9*     ALB  2.2*  2.3*     GLOB  3.5  3.6         LPSE  25*  23*          Recent Labs           02/04/21   2222     INR  1.2*        PTP  12.8*             Patient Active Problem List        Diagnosis  Code         ?  Hemorrhagic shock (HCC)  R57.8         ?  Acute blood loss anemia  D62           Assessment and Plan:   Continue PPI therapy at this time   Agree with blood transfusion monitor keep hemoglobin greater than 7 g/dL   Please keep her n.p.o. after midnight.  We will plan for an upper endoscopy tomorrow   Okay to have a clear liquid diet today         Thank you for allowing [place] to participate in the care of your patient. If you have any questions please don't hesitate to contact us.  Marisa Hua, M.D.   [phone]   [facility]          Signed By:  Marisa Hua, MD           02/05/2021  12:09 PM

## 2021-02-05 NOTE — Progress Notes (Signed)
Problem: Pressure Injury - Risk of  Goal: *Prevention of pressure injury  Description: Document Braden Scale and appropriate interventions in the flowsheet.  Outcome: Progressing Towards Goal  Note: Pressure Injury Interventions:  Sensory Interventions: Assess changes in LOC,Keep linens dry and wrinkle-free,Minimize linen layers    Moisture Interventions: Absorbent underpads,Internal/External urinary devices,Minimize layers    Activity Interventions: Assess need for specialty bed    Mobility Interventions: Assess need for specialty bed,Turn and reposition approx. every two hours(pillow and wedges)    Nutrition Interventions: Document food/fluid/supplement intake    Friction and Shear Interventions: HOB 30 degrees or less,Minimize layers                Problem: Falls - Risk of  Goal: *Absence of Falls  Description: Document Schmid Fall Risk and appropriate interventions in the flowsheet.  Outcome: Progressing Towards Goal  Note: Fall Risk Interventions:       Mentation Interventions: Adequate sleep, hydration, pain control,More frequent rounding,Increase mobility,Reorient patient    Medication Interventions: Evaluate medications/consider consulting pharmacy    Elimination Interventions: Toileting schedule/hourly rounds

## 2021-02-05 NOTE — ED Notes (Signed)
Placed on [facility] for supportive measures.

## 2021-02-05 NOTE — Progress Notes (Signed)
[facility] Dosing Services:  The following medication: levofloxacin was automatically dose-adjusted per [facility] P&T Committee Protocol, with respect to renal function.        Pt Weight:   Wt Readings from Last 1 Encounters:   02/05/21 48.5 kg (106 lb 14.8 oz)         Previous Regimen 750 mg q 24 h    Serum Creatinine Lab Results   Component Value Date/Time    Creatinine 1.07 (H) 02/05/2021 04:02 AM       Creatinine Clearance CrCl cannot be calculated (Unknown ideal weight.).   BUN Lab Results   Component Value Date/Time    BUN 66 (H) 02/05/2021 04:02 AM       Dosage changed to:  750 mg q 48 h     Additional notes:      Pharmacy to continue to monitor patient daily.   Will make dosage adjustments based upon changing renal function.  Signed Vida Roller, PHARMD. Contact information:  [phone]

## 2021-02-05 NOTE — Progress Notes (Signed)
Bedside and Verbal shift change report given to Philis Kendall, RN (oncoming nurse) by Raenette Rover, RN (offgoing nurse). Report included the following information SBAR, Kardex, MAR and Cardiac Rhythm Sinus rhythm/sinus tach.     0700: pt without complaints. Alert x 3. Instructed to call for assistance before getting out of bed. Call light in reach.   0850: HGB 7.6. Blood bank called to confirm if pt still needed 4th unit due to blood shortage. Will wait for next H&H lab draw at 1000. Dr. Rushie Chestnut and Dr. Sudie Bailey informed.   1000: Patient without complaints.  1200: Dr. Ruffin Frederick in to see patient. Verbal orders for clear liquid diet.    1600: Niece at bedside.     Bedside and Verbal shift change report given to Nasrin, RN (oncoming nurse) by Philis Kendall, RN (offgoing nurse). Report included the following information SBAR, Kardex, MAR and Cardiac Rhythm Sinus rhythm.

## 2021-02-05 NOTE — Progress Notes (Signed)
Problem: Pressure Injury - Risk of  Goal: *Prevention of pressure injury  Description: Document Braden Scale and appropriate interventions in the flowsheet.  Outcome: Progressing Towards Goal  Note: Pressure Injury Interventions:  Sensory Interventions: Assess changes in LOC,Check visual cues for pain,Minimize linen layers,Turn and reposition approx. every two hours (pillows and wedges if needed),Float heels    Moisture Interventions: Absorbent underpads,Internal/External urinary devices    Activity Interventions: Assess need for specialty bed,Pressure redistribution bed/mattress(bed type)    Mobility Interventions: Assess need for specialty bed,Turn and reposition approx. every two hours(pillow and wedges)    Nutrition Interventions: Document food/fluid/supplement intake    Friction and Shear Interventions: HOB 30 degrees or less                Problem: Patient Education: Go to Patient Education Activity  Goal: Patient/Family Education  Outcome: Progressing Towards Goal     Problem: Falls - Risk of  Goal: *Absence of Falls  Description: Document Schmid Fall Risk and appropriate interventions in the flowsheet.  Outcome: Progressing Towards Goal  Note: Fall Risk Interventions:       Mentation Interventions: Bed/chair exit alarm,Door open when patient unattended,Room close to nurse's station    Medication Interventions: Bed/chair exit alarm,Patient to call before getting OOB    Elimination Interventions: Patient to call for help with toileting needs,Call light in reach,Bed/chair exit alarm              Problem: Patient Education: Go to Patient Education Activity  Goal: Patient/Family Education  Outcome: Progressing Towards Goal

## 2021-02-05 NOTE — Progress Notes (Signed)
Bilateral LE venous duplex completed. Final results to follow.

## 2021-02-05 NOTE — Progress Notes (Signed)
[facility]  [address] [place], [place] [address]  [phone]      Medical Progress Note      NAME: Savannah Irwin   DOB:  [birth year]  MRM:  [id number]    Date/Time: 02/05/2021  2:04 PM         Subjective:     Chief Complaint:  "I'm okay"     Pt seen and examined. Currently without ab pain. Tolerating clears     ROS:  (bold if positive, if negative)      Ab pain improved        Objective:       Vitals:          Last 24hrs VS reviewed since prior progress note. Most recent are:    Visit Vitals  BP 123/69   Pulse 91   Temp 98 ??F (36.7 ??C)   Resp 20   Wt 48.5 kg (106 lb 14.8 oz)   SpO2 (!) 86%     SpO2 Readings from Last 6 Encounters:   02/05/21 (!) 86%    O2 Flow Rate (L/min): 2 l/min       Intake/Output Summary (Last 24 hours) at 02/05/2021 1404  Last data filed at 02/05/2021 1093  Gross per 24 hour   Intake 789.17 ml   Output 475 ml   Net 314.17 ml          Exam:     Physical Exam:    Gen: Frail elderly AAF  HEENT:  Pink conjunctivae, PERRL, hearing intact to voice, moist mucous membranes  Neck:  Supple, without masses, thyroid non-tender  Resp:  No accessory muscle use, clear breath sounds without wheezes rales or rhonchi  Card:  No murmurs, normal S1, S2 without thrills, bruits or peripheral edema  Abd:  Soft, non-tender, non-distended, normoactive bowel sounds are present  Musc:  No cyanosis or clubbing  Skin:  No rashes or ulcers, skin turgor is good  Neuro: Mild diffuse weakness but non-focal   Psych:  Good insight, oriented to person, place and time, alert      Medications Reviewed: (see below)    Lab Data Reviewed: (see below)    ______________________________________________________________________    Medications:     Current Facility-Administered Medications   Medication Dose Route Frequency   ??? sodium chloride (NS) flush 5-40 mL  5-40 mL IntraVENous Q8H   ??? sodium chloride (NS) flush 5-40 mL  5-40 mL IntraVENous PRN   ??? acetaminophen (TYLENOL) tablet 650 mg  650 mg Oral Q6H  PRN    Or   ??? acetaminophen (TYLENOL) suppository 650 mg  650 mg Rectal Q6H PRN   ??? polyethylene glycol (MIRALAX) packet 17 g  17 g Oral DAILY PRN   ??? dextrose 5% and 0.9% NaCl infusion  125 mL/hr IntraVENous CONTINUOUS   ??? ondansetron (ZOFRAN) injection 4 mg  4 mg IntraVENous Q4H PRN   ??? pantoprazole (PROTONIX) 40 mg in 0.9% sodium chloride 10 mL injection  40 mg IntraVENous BID   ??? PHENYLephrine (NEO-SYNEPHRINE) 30 mg in 0.9% sodium chloride 250 mL infusion  10-300 mcg/min IntraVENous TITRATE   ??? 0.9% sodium chloride infusion 250 mL  250 mL IntraVENous PRN   ??? 0.9% sodium chloride infusion 250 mL  250 mL IntraVENous PRN            Lab Review:     Recent Labs     02/05/21  0953 02/05/21  0402 02/04/21  2222  WBC  --  13.2* 13.9*   HGB 8.6* 7.6* 4.8*   HCT 25.6* 23.5* 15.3*   PLT  --  263 318     Recent Labs     02/05/21  0402 02/04/21  2222   NA 138 136   K 4.5 5.2*   CL 112* 109*   CO2 20* 17*   GLU 196* 286*   BUN 66* 82*   CREA 1.07* 1.34*   CA 7.6* 7.9*   MG  --  1.6   PHOS  --  3.2   ALB 2.2* 2.3*   ALT 20 22   INR  --  1.2*     No components found for: GLPOC    Ab CT =>   1. No CT evidence of acute GI bleed.  2. Possible distal gastric/proximal duodenal wall thickening, could be due to  underdistention or an infectious/inflammatory process. Colon also not well  distended. No bowel obstruction.  3. Bilateral nephrolithiasis with no hydronephrosis.  4. 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       Assessment / Plan:   Ms. Montalban is a 75 yo AAF with PMH of gastric ulcers and PE admitted with hemorrhagic shock and AKI 2/2 acute blood loss anemia from GI bleed     Hemorrhagic shock (HCC) (02/05/2021) - 2/2 PRBC's  -see below   -serial H+H   -continue IV PPI BID   -GI eval appreciated; for EGD in the AM   -continue IVF's; decrease rate   -neo to maintain BP     AKI - likely 2/2 hemorrhagic shock   -improving with optimization of BP and anemia   -continue IVF's       GI  bleed   -as above; plans for endoscopy in the AM     Hyperglycemia - ?2/2 DM   -check A1c   -stop D5 and change to LR     Lactic acid elevation - likely 2/2 #1  -trend to normal   -neo gtt to maintain BP's   -optimize anemia   -IVF's     Leukocytosis - ?stress response   -check UA   -CXR without PNA   -CT as above; ?duodenitiis => start levo/flagyl now     H/o PE   -off eliquis currently   -check LE dopplers; if DVT may need IVC filter as uncertain when we will be able to resume anticoagulation     Total time spent with patient: 35 Minutes from 1:30-2:15p                  Care Plan discussed with: Patient and Nursing Staff    Discussed:  Care Plan    Prophylaxis:  SCD's    Disposition:  TBD           ___________________________________________________    Attending Physician: Gaspar Bidding, MD

## 2021-02-05 NOTE — Consults (Signed)
Pulmonary/Critical Care/Sleep Medicine Note  Name:    Savannah Irwin                                    DOB:   1946/06/08                             #[id number]  Date:   02/05/2021  Hospital:   [facility], [facility], [place], [place]:    [phone]                      Resuscitation.     Do Not Resuscitate                                                      SUBJECTIVE.    SUMMARY .    75 year old woman presents with melena.  She had recently been admitted to a hospital in [place] with pulmonary embolus and started on anticoagulation, Eliquis.     No prior history of gastrointestinal bleeding.  According to the chart, the patient, who lives in [place] was found by family members to be very weak progressively disoriented with symptoms of nausea vomiting abdominal pain chills.  She was then brought to [place] by her niece.  While here, the patient deteriorated further with weakness, nausea, vomiting, melena as well as abdominal pain.  The patient was then brought to the hospital where she was found to be extremely anemic.  The hemoglobin of 4.8, as was hypotensive.  No respiratory distress.  At the time of my encounter with the patient, the patient is resting quietly and sleeping.  The patient was, not manifesting any dyspnea or chest pain or abdominal pain.  There was no reported nausea vomiting or diarrhea.  No reported cardiac arrhythmias.  She is already received fluid challenges and blood in the emergency room.  Blood transfusion is continuing at the time of my examination.  Examination was reviewed with the nurse assisting.  No past medical history on file.   No past surgical history on file.  No family history on file.   Social History     Tobacco Use   ??? Smoking status: Not on file   ??? Smokeless tobacco: Not on file   Substance Use Topics   ??? Alcohol use: Not on file       Current Facility-Administered Medications   Medication Dose Route Frequency Provider Last Rate  Last Admin   ??? sodium chloride (NS) flush 5-40 mL  5-40 mL IntraVENous Q8H Zannie Kehr H, DO       ??? sodium chloride (NS) flush 5-40 mL  5-40 mL IntraVENous PRN Zannie Kehr H, DO       ??? acetaminophen (TYLENOL) tablet 650 mg  650 mg Oral Q6H PRN Zannie Kehr H, DO        Or   ??? acetaminophen (TYLENOL) suppository 650 mg  650 mg Rectal Q6H PRN Zannie Kehr H, DO       ??? polyethylene glycol (MIRALAX) packet 17 g  17 g Oral DAILY PRN Zannie Kehr H, DO       ???  0.9% sodium chloride infusion  125 mL/hr IntraVENous CONTINUOUS Zannie Kehr H, DO       ??? dextrose 5% and 0.9% NaCl infusion  125 mL/hr IntraVENous CONTINUOUS Thamas Jaegers, MD       ??? ondansetron [facility]) injection 4 mg  4 mg IntraVENous Q4H PRN Thamas Jaegers, MD       ??? pantoprazole (PROTONIX) 40 mg in 0.9% sodium chloride 10 mL injection  40 mg IntraVENous BID Thamas Jaegers, MD       ??? octreotide (SANDOSTATIN) 500 mcg in 0.9% sodium chloride 500 mL infusion  50 mcg/hr IntraVENous Ludwig Lean, MD       ??? 0.9% sodium chloride infusion 250 mL  250 mL IntraVENous PRN Raymondo Band, MD       ??? 0.9% sodium chloride infusion 250 mL  250 mL IntraVENous PRN Raymondo Band, MD             Objective   PHYSICAL EXAMINATION:  Vital signs for last 24 hours:  Visit Vitals  BP (!) 106/59   Pulse 96   Temp 97.6 ??F (36.4 ??C)   Resp 22   Wt 51.1 kg (112 lb 10.5 oz)   SpO2 94%       Intake/Output this shift:  Current Shift: 02/26 1901 - 02/27 0700  In: 302.5   Out: -   Last 3 Shifts: No intake/output data recorded.           Telemedicine real-time synchronous audio/video (inpatient hospital) patient interaction with the assistance of the bedside nursing staff acting as presenter at the originating site, and my review of electronic medical records by access to the hospital electronic medical record system, and access to the digital radiology record system. Remote location of the physician is [place].    Vital signs.   25 on 2 L = 98%.  96 sinus rhythm.   91/55, no pressors.     No ET tube.   .     ENT.   No blood around the nose or mouth.  No facial rash or swelling.     CHEST.     No wheezes.   No rales.  No use of accessory muscles.  HEART.  Regular rhythm.    ABDOMEN.   Soft. No tenderness elicited.  Bowel sounds normal. Not distended  EXTREMITIES.     No  edema.   Well perfused .  NEUROLOGIC .   No focal weakness reported. No tremor.   Earlier she was alert, though responses were delayed.  The patient was now sleeping.  SKELETAL.  DJD joint deformity.       LABORATORY STUDIES.  Hemoglobin is 4.8.  INR is 1.2.  BUN and creatinine are 82 and 1.34.  Lactic acid is 4.9.  Radiographic studies.  CT abdomen pelvis, reviewed by me, demonstrates no infiltrates at the lung bases, but there is a Mosaic pattern, as well as some emphysema changes..  Thickening of the stomach wall and duodenum,with fluid in the stomach and duodenum.  Distended bladder.  No perforation.  No free air.  No mass.  Surgical clips suggestive of prior cholecystectomy.  Data Review:   Recent Results (from the past 24 hour(s))   RBC, ALLOCATE    Collection Time: 02/04/21 10:15 PM   Result Value Ref Range    HISTORY CHECKED? Historical check performed    CBC WITH AUTOMATED DIFF    Collection Time: 02/04/21 10:22 PM   Result Value Ref Range  WBC 13.9 (H) 3.6 - 11.0 K/uL    RBC 1.61 (L) 3.80 - 5.20 M/uL    HGB 4.8 (LL) 11.5 - 16.0 g/dL    HCT 15.3 (LL) 35.0 - 47.0 %    MCV 95.0 80.0 - 99.0 FL    MCH 29.8 26.0 - 34.0 PG    MCHC 31.4 30.0 - 36.5 g/dL    RDW 18.9 (H) 11.5 - 14.5 %    PLATELET 318 150 - 400 K/uL    MPV 9.4 8.9 - 12.9 FL    NRBC 0.9 (H) 0 PER 100 WBC    ABSOLUTE NRBC 0.12 (H) 0.00 - 0.01 K/uL    NEUTROPHILS 59 32 - 75 %    LYMPHOCYTES 35 12 - 49 %    MONOCYTES 6 5 - 13 %    EOSINOPHILS 0 0 - 7 %    BASOPHILS 0 0 - 1 %    IMMATURE GRANULOCYTES 0 %    ABS. NEUTROPHILS 8.2 (H) 1.8 - 8.0 K/UL    ABS. LYMPHOCYTES 4.9 (H) 0.8 - 3.5 K/UL    ABS. MONOCYTES 0.8 0.0 - 1.0 K/UL    ABS. EOSINOPHILS 0.0  0.0 - 0.4 K/UL    ABS. BASOPHILS 0.0 0.0 - 0.1 K/UL    ABS. IMM. GRANS. 0.0 K/UL    DF MANUAL      RBC COMMENTS ANISOCYTOSIS  1+        RBC COMMENTS MACROCYTOSIS  1+       METABOLIC PANEL, COMPREHENSIVE    Collection Time: 02/04/21 10:22 PM   Result Value Ref Range    Sodium 136 136 - 145 mmol/L    Potassium 5.2 (H) 3.5 - 5.1 mmol/L    Chloride 109 (H) 97 - 108 mmol/L    CO2 17 (L) 21 - 32 mmol/L    Anion gap 10 5 - 15 mmol/L    Glucose 286 (H) 65 - 100 mg/dL    BUN 82 (H) 6 - 20 MG/DL    Creatinine 1.34 (H) 0.55 - 1.02 MG/DL    BUN/Creatinine ratio 61 (H) 12 - 20      GFR est AA 47 (L) >60 ml/min/1.99m    GFR est non-AA 39 (L) >60 ml/min/1.715m   Calcium 7.9 (L) 8.5 - 10.1 MG/DL    Bilirubin, total 0.2 0.2 - 1.0 MG/DL    ALT (SGPT) 22 12 - 78 U/L    AST (SGOT) 17 15 - 37 U/L    Alk. phosphatase 48 45 - 117 U/L    Protein, total 5.9 (L) 6.4 - 8.2 g/dL    Albumin 2.3 (L) 3.5 - 5.0 g/dL    Globulin 3.6 2.0 - 4.0 g/dL    A-G Ratio 0.6 (L) 1.1 - 2.2     MAGNESIUM    Collection Time: 02/04/21 10:22 PM   Result Value Ref Range    Magnesium 1.6 1.6 - 2.4 mg/dL   PROTHROMBIN TIME + INR    Collection Time: 02/04/21 10:22 PM   Result Value Ref Range    INR 1.2 (H) 0.9 - 1.1      Prothrombin time 12.8 (H) 9.0 - 11.1 sec   LACTIC ACID    Collection Time: 02/04/21 10:22 PM   Result Value Ref Range    Lactic acid 4.9 (HH) 0.4 - 2.0 MMOL/L   LIPASE    Collection Time: 02/04/21 10:22 PM   Result Value Ref Range    Lipase 23 (L) 73 -  393 U/L   TYPE & SCREEN    Collection Time: 02/04/21 10:22 PM   Result Value Ref Range    Crossmatch Expiration 02/07/2021,2359     ABO/Rh(D) O POSITIVE     Antibody screen NEG     Unit number [id number]     Blood component type RC LR,1     Unit division 00     Status of unit ISSUED     Crossmatch result Compatible     Unit number [id number]     Blood component type RC LR,1     Unit division 00     Status of unit ISSUED     Crossmatch result Compatible     Unit number [id number]     Blood  component type RC LR,1     Unit division 00     Status of unit ALLOCATED     Crossmatch result Compatible    PHOSPHORUS    Collection Time: 02/04/21 10:22 PM   Result Value Ref Range    Phosphorus 3.2 2.6 - 4.7 MG/DL   OCCULT BLOOD, STOOL    Collection Time: 02/04/21 10:59 PM   Result Value Ref Range    Occult blood, stool Positive (A) NEG     RBC, ALLOCATE    Collection Time: 02/04/21 11:00 PM   Result Value Ref Range    HISTORY CHECKED? Historical check performed        IMPRESSION.       ???   Acute blood loss anemia, upper G.I. bleeding, related most likely to the administration of Eliquis for the recent pulmonary embolus.  ??? Pulmonary embolus.    ??? Volume depletion due to blood loss.  As well as probably recent poor PO intake.  PLAN.   ??? Intravenous fluids in accordance with vascular assessment for hypotension and perfusion. Ongoing assessment of volume status.  ??? Pressors- Levophed. Vasopressin may be necessary.  ??? Monitor electrolytes and replace as needed.   ??? Venous prophylaxis.  Mechanical  ??? Gastrointestinal therapy with intravenous Protonix.  Octreotide    ??? Insulin regimen for glucose control.         Beryle Lathe. Jalene Mullet, MD,  [facility], FAASM    Pulmonary Medicine ?? Sleep Medicine ?? Critical Care       Originating site:      hospital listed above  Presenter:    ICU nurse for the shift,    I performed all aspects of the physical examination via Telemedicine associated with two way audio and video communication and with the on-site assistance of  the presenter .  I am located in [place], [place] and the patient is located in the facility noted.   The patient is critically ill in the ICU.   I personally  reviewed the pertinent medical records, laboratory/ pathology data and radiographic images.  The decision making regarding this patient is as documented above, which was generated  following  discussion  with the multidisciplinary ICU team and creation of a treatment plan for  the patient. We discussed the patient's  interval history and future coordination of care and  plans.  The patient's medications  were reviewed and changes made as stipulated above.  Due to  critical illness impairing one or more vital organs of this patient resulting in life threatening clinical situation  I have provided direct, frequent personal  assessment and manipulation in management plan.    60 minutes total critical care time; greater than 50% of the time was spent  in direct patient care and coordination of care.

## 2021-02-05 NOTE — Progress Notes (Signed)
 Visit Vitals  BP 98/60   Pulse 93   Temp 98.8 F (37.1 C)   Resp 13   Wt 48.5 kg (106 lb 14.8 oz)   SpO2 98%     Recent Results (from the past 12 hour(s))   HGB & HCT    Collection Time: 02/05/21  9:53 AM   Result Value Ref Range    HGB 8.6 (L) 11.5 - 16.0 g/dL    HCT 74.3 (L) 64.9 - 47.0 %   HEMOGLOBIN A1C WITH EAG    Collection Time: 02/05/21  9:53 AM   Result Value Ref Range    Hemoglobin A1c 6.3 (H) 4.0 - 5.6 %    Est. average glucose 134 mg/dL   URINALYSIS W/ REFLEX CULTURE    Collection Time: 02/05/21 12:22 PM    Specimen: Urine   Result Value Ref Range    Color YELLOW/STRAW      Appearance CLEAR CLEAR      Specific gravity 1.025 1.003 - 1.030      pH (UA) 5.5 5.0 - 8.0      Protein Negative NEG mg/dL    Glucose Negative NEG mg/dL    Ketone Negative NEG mg/dL    Bilirubin Negative NEG      Blood Negative NEG      Urobilinogen 0.2 0.2 - 1.0 EU/dL    Nitrites Negative NEG      Leukocyte Esterase Negative NEG      WBC 0-4 0 - 4 /hpf    RBC 0-5 0 - 5 /hpf    Epithelial cells FEW FEW /lpf    Bacteria Negative NEG /hpf    UA:UC IF INDICATED CULTURE NOT INDICATED BY UA RESULT CNI      Hyaline cast 0-2 0 - 5 /lpf   LACTIC ACID    Collection Time: 02/05/21 12:22 PM   Result Value Ref Range    Lactic acid 1.3 0.4 - 2.0 MMOL/L     1900 Bedside and Verbal shift change report given to Nasrin, RN (Cabin crew) by Todd, RN (offgoing nurse). Report included the following information SBAR, Intake/Output, Med Rec Status, Cardiac Rhythm NSR and Alarm Parameters .     1931 c/o ABD pain/discomfort pain 08/10 alert/oriented; able to pass gas. Notified Hospitalist.     2000 per patient; pain level decreased after morphine IV push.     2003 restarted NEO drip; for BP 82/46 (58)    2108 C/o pain to right shoulder and abd pain pain level 9/10. Notified Hospitalist.     2201 per MD. Lidocaine for shoulder pain. ABD pain need to wait for next morphine dose due to soft blood pressure. Patient agrees to the treatment plan.     ~0004  reassess pain level post lidocaine and morphine iv. Patient appears resting in the bed comfortably with resp rate ~12-20   Patient Vitals for the past 4 hrs:   Temp Pulse Resp BP SpO2   02/06/21 0130 -- 83 21 102/63 94 %   02/06/21 0115 -- 83 19 102/62 95 %   02/06/21 0100 -- 82 17 104/61 95 %   02/06/21 0045 -- 77 16 (!) 96/53 94 %   02/06/21 0030 -- 83 18 105/66 96 %   02/06/21 0015 -- 80 19 (!) 103/56 96 %   02/06/21 0000 -- 82 20 (!) 103/59 96 %   02/05/21 2345 -- 80 16 (!) 93/56 95 %   02/05/21 2330 -- 83 19 (!) 87/58 96 %  02/05/21 2315 -- 81 14 (!) 100/59 97 %   02/05/21 2314 98.7 F (37.1 C) -- -- -- --   02/05/21 2300 -- 79 18 105/62 98 %   02/05/21 2245 -- 82 18 99/71 99 %   02/05/21 2230 -- 87 22 98/66 99 %   02/05/21 2215 -- 84 20 97/60 97 %   02/05/21 2200 -- 83 15 107/63 98 %   02/05/21 2145 -- 82 17 103/61 98 %       0700 Bedside and Verbal shift change report given to ,RN Control and instrumentation engineer) by Jacquetta PEAK (offgoing nurse). Report included the following information SBAR, Intake/Output, Recent Results, Cardiac Rhythm NSR and Alarm Parameters .   See Flowsheet for complete assessment  See MAR for medication administration and drip titration.  BP 131/70   Pulse 75   Temp 98.5 F (36.9 C)   Resp 15   Wt 48.5 kg (106 lb 14.8 oz)   SpO2 93%   Recent Results (from the past 12 hour(s))   HGB & HCT    Collection Time: 02/05/21  8:02 PM   Result Value Ref Range    HGB 7.5 (L) 11.5 - 16.0 g/dL    HCT 77.4 (L) 64.9 - 47.0 %   PROTHROMBIN TIME + INR    Collection Time: 02/06/21  3:45 AM   Result Value Ref Range    INR 1.1 0.9 - 1.1      Prothrombin time 11.6 (H) 9.0 - 11.1 sec   CBC WITH AUTOMATED DIFF    Collection Time: 02/06/21  3:45 AM   Result Value Ref Range    WBC 11.9 (H) 3.6 - 11.0 K/uL    RBC 2.67 (L) 3.80 - 5.20 M/uL    HGB 8.0 (L) 11.5 - 16.0 g/dL    HCT 75.4 (L) 64.9 - 47.0 %    MCV 91.8 80.0 - 99.0 FL    MCH 30.0 26.0 - 34.0 PG    MCHC 32.7 30.0 - 36.5 g/dL    RDW 82.6 (H) 88.4 - 14.5 %     PLATELET 228 150 - 400 K/uL    MPV 8.5 (L) 8.9 - 12.9 FL    NRBC 2.9 (H) 0 PER 100 WBC    ABSOLUTE NRBC 0.35 (H) 0.00 - 0.01 K/uL    NEUTROPHILS 57 32 - 75 %    LYMPHOCYTES 32 12 - 49 %    MONOCYTES 7 5 - 13 %    EOSINOPHILS 1 0 - 7 %    BASOPHILS 1 0 - 1 %    IMMATURE GRANULOCYTES 2 (H) 0.0 - 0.5 %    ABS. NEUTROPHILS 6.9 1.8 - 8.0 K/UL    ABS. LYMPHOCYTES 3.8 (H) 0.8 - 3.5 K/UL    ABS. MONOCYTES 0.8 0.0 - 1.0 K/UL    ABS. EOSINOPHILS 0.1 0.0 - 0.4 K/UL    ABS. BASOPHILS 0.1 0.0 - 0.1 K/UL    ABS. IMM. GRANS. 0.2 (H) 0.00 - 0.04 K/UL    DF AUTOMATED     METABOLIC PANEL, BASIC    Collection Time: 02/06/21  3:45 AM   Result Value Ref Range    Sodium 138 136 - 145 mmol/L    Potassium 4.2 3.5 - 5.1 mmol/L    Chloride 111 (H) 97 - 108 mmol/L    CO2 22 21 - 32 mmol/L    Anion gap 5 5 - 15 mmol/L    Glucose 94 65 - 100 mg/dL  BUN 36 (H) 6 - 20 MG/DL    Creatinine 9.08 9.44 - 1.02 MG/DL    BUN/Creatinine ratio 40 (H) 12 - 20      GFR est AA >60 >60 ml/min/1.54m2    GFR est non-AA >60 >60 ml/min/1.65m2    Calcium 8.0 (L) 8.5 - 10.1 MG/DL   PROCALCITONIN    Collection Time: 02/06/21  3:45 AM   Result Value Ref Range    Procalcitonin <0.05 ng/mL

## 2021-02-05 NOTE — ED Notes (Signed)
Report given to [facility], ICU RN for continuity of care.    Chief Complaint: N/V, dark stools, abdominal pain, fatigue/lethargy X 18 hours.    Medical History: HLD, Anxiety/Depression, Hypothyroidism, DM2, Recent PE    Code Status: Full Code    Isolation: None    Medications Scheduled: IV fluids continuous and PRBC Transfusions     IV Access: 18G LT AC, 20G LT Forearm    Monitor/Vital Signs: Central cardiac, sinus tach, BP Q5M, SpO2 continuous    Oxygen: 2LNC (supportive/comfort)    Mobility: Wheelchair upon arrival, PureWick in place    Interventions: Blood transfusions, Kcentra, Abdominal CT, occult blood stool, blood cultures X 2, IVF bolus via pressure bag    Significant Results: H/H 4.8/15.3, Lactic acid 4.9    Outstanding Orders: Inpatient maintenance orders    Consults: Hospitalist    Plan of Care: Admit to ICU, blood transfusions, trend H/H, GI rest    Family at Bedside: Niece Baird Cancer returned home to change and charge her phone, can be reached at [phone] for any questions and updates

## 2021-02-05 NOTE — H&P (Signed)
History and Physical  NAME: Savannah Irwin  MRN: [id number]  DATE OF BIRTH: 1946/07/27  AGE: 75 y.o.  female  SOCIAL SECURITY NUMBER: [id number]  Primary Care Provider: None  CODE STATUS: Full Code      ASSESSMENT/PLAN:  1.  Hemorrhagic shock.  Patient presented with hemoglobin/hematocrit 4.8/15.3.  Patient was given 2 L of normal saline bolus and had to be started on phenylephrine drip in the emergency department.  Continue pressor support as needed.  Patient is being transfused 4 units of packed red blood cells.  Hemoglobin/hematocrit has come up to 7.6/23.5.  2.  Gastrointestinal hemorrhage.  Gastroenterology consultation requested.  Patient is on octreotide drip as ordered by intensivist.  She is also on proton pump inhibitor with Protonix 40 mg IV twice daily.  3.  Acquired hypothyroidism.  Patient has a history of Graves' disease, but now is on thyroid replacement with levothyroxine.  4.  Pulmonary emphysema.  Patient was borderline hypoxic in the emergency department with oxygen saturation 88% on room air--this could be due to a combination of hypoperfusion confounding the pulse oximeter as well as underlying hypoxia due to lung disease.  5.  Abnormal pancreatic findings on CT scan. Small cluster of hypodensities in the pancreatic head, 9 x 7 mm may represent IPMN.      History of Presenting Illness:   Savannah Irwin is a 75 y.o. female who presents with coffee-ground emesis, melena.  History is somewhat limited due to acuity of condition.  Patient is somnolent but arousable at time of examination.  She states that she had coffee-ground material come out of her nose as well as out of her mouth.  She states that she also had some black stools.  She denies any red blood in the stools.  Patient states that she has been having abdominal pain in the central region.  Patient denies regular NSAID use, stating that she takes Tylenol as needed.      Patient was in hemorrhagic  shock (hemoglobin/hematocrit 4.8/15.3 at presentation) at the time of presentation to the emergency department and had to be started on phenylephrine.  4 units of packed red blood cells were ordered.  She has been given [place].   Patient has been admitted to the intensive care unit.  Intensivist has started her on octreotide drip.    PER EMERGENCY DEPARTMENT RECORDS AT [place] DURING THIS ADMISSION:  Patient was recently admitted to hospital in [place] and diagnosed with a pulmonary embolism.  She was started on Eliquis.  Her niece went to [place] and picked her up last week and brought her to [place].  Patient has not been feeling well since arriving in [place].  Today she developed dark black stool.  Patient endorses epigastric pain.  She has no history of GI bleed.  Patient endorses feeling lightheaded but denies chest pain or shortness of breath.      REVIEW OF OLD RECORDS:  Patient was recently hospitalized at [facility] from 01/17/2021 until 01/23/2021.  Per discharge summary, she has a history of heart failure with preserved ejection fraction and was on Lasix.  Patient presented with complaints of generalized weakness, fatigue and abdominal pain.  She also complained of  having associated weight loss and decreased appetite for the previous 2 weeks. Patient has been vaccinated for COVID.  In ED she was found to be hyponatremic, dehydrated with mild acute kidney injury.  Hyponatremia was likely diuretic induced and dehydration and therefore Lasix was held.  Patient was started on 0.9 NS IV fluids, intake and output was monitored periodically, sodium level improved. Patient had electrolyte abnormalities-hypokalemia/hypomagnesemia and hypophosphatemia and electrolytes were supplemented and monitored. Electrolytes improved.  Patient was having baseline sinus tachycardia with heart rate in 110s, increasing up to 140s on ambulation. She was asymptomatic throughout. She was placed  on a telemetry monitor. EKG was nonischemic. Troponin was within normal limits. Discussed with cardiology-they recommended that as patient is asymptomatic and she has sinus tachycardia, she can follow-up as outpatient in the clinic.  PT/OT was consulted and recommended skilled nursing facility. However patient refused SNF and wanted to go home.  Symptoms significantly improved and she was deemed medically appropriate for discharge.  Appointment made with cardiology [place] on 01/27/2021 at 9:15 AM.  Prior to discharge-contacted patient's cousin Hassan Rowan and updated regarding discharge plan and answered all her questions and concerns           Review of Systems:  Review of systems is limited to that presented in HPI due to acuity of patient's condition.    Past Medical History:   Diagnosis Date   ??? Emphysema lung ([place])    ??? Fibromyalgia    ??? Gout    ??? Graves' disease    ??? Pulmonary hypertension [facility])     Per records from [facility], patient has a history of severe pulmonary hypertension as admission of February, 2022.  Previously, echocardiogram of 11/05/2016 at [facility] showed mild pulmonary hypertension, normal calculated LVEF of 63%, mild tricuspid regurgitation and trace mitral regurgitation.   ??? Type 2 diabetes mellitus ([place])         PAST SURGICAL HISTORY:   Past Surgical History:   Procedure Laterality Date   ??? HX CHOLECYSTECTOMY  1972   ??? HX HYSTERECTOMY       Home medications:  Discharge medications from [facility] on 01/23/2021:  alendronate (FOSAMAX) 70 MG tablet Take 70 mg by mouth every 7 days. Saturday   allopurinol (ZYLOPRIM) 100 MG tablet Take 100 mg by mouth daily.   amitriptyline (ELAVIL) 50 MG tablet Take 50 mg by mouth nightly.   atenoloL (TENORMIN) 25 MG tablet HOLD UNTIL FOLLOW UP WITH PRIMARY CARE DOCTOR   atorvastatin (LIPITOR) 10 MG tablet Take 10 mg by mouth daily.   azelastine-fluticasone (DYMISTA) 137-50 mcg/spray nasal spray 2 sprays  by Nasal route 2 times daily.   clotrimazole-betamethasone (LOTRISONE) 1-0.05 % cream Apply 1 application topically 2 times daily. To affected areas   ergocalciferol (VITAMIN D2) 50,000 unit capsule Take 50,000 Units by mouth once a week.   levothyroxine (SYNTHROID) 50 MCG tablet Take 50 mcg by mouth Daily at 0600.   magnesium oxide (MAG-OX) 400 mg tablet Take 400 mg by mouth 2 times daily.   oxyCODONE-acetaminophen (PERCOCET) 7.5-325 mg per tablet Take 1 tablet by mouth 3 (three) times daily as needed for Pain.   pantoprazole (PROTONIX) 40 MG tablet Take 40 mg by mouth daily.   sertraline (ZOLOFT) 50 MG tablet Take 50 mg by mouth daily.   tiZANidine (ZANAFLEX) 4 MG tablet Take 4 mg by mouth 3 (three) times daily as needed for Muscle spasms.   ipratropium (ATROVENT HFA) 17 mcg/actuation inhaler Inhale 2 puffs into the lungs. As directed.   naloxone (NARCAN) 4 mg/actuation Spry by Nasal route. Use as directed.   pioglitazone (ACTOS) 30 MG tablet HOLD UNTIL FOLLOW UP WITH PRIMARY CARE DOCTOR    STOP taking these medications  furosemide (LASIX) 20 MG tablet   promethazine (PHENERGAN) 25 MG tablet      Per review of 01/23/2021 discharge medications from [facility] (where patient gets most of her care), patient does not appear to be on any anticoagulants at home at that point.      Allergies   Allergen Reactions   ??? Penicillins Itching        Family History   Problem Relation Age of Onset   ??? OSTEOARTHRITIS Mother    ??? Diabetes Mother    ??? Hypertension Mother    ??? Elevated Lipids Sister    ??? Hypertension Sister    ??? Thyroid Disease Sister         Berenice Primas' disease        Social History     Tobacco Use   ??? Smoking status: Current Every Day Smoker     Packs/day: 0.50   ??? Smokeless tobacco: Never Used   ??? Tobacco comment: Patient stated that she started smoking at the age of 24.   Substance Use Topics   ??? Alcohol use: Not Currently     Comment: Patient denies any history of excessive alcohol intake.   ??? Drug  use: Never       Physical exam  Patient Vitals for the past 24 hrs:   BP Temp Pulse Resp SpO2  oxygen therapy   02/05/21 0600 (!) 110/54 ??? 99 19 95 %    02/05/21 0545 (!) 109/58 ??? 98 13 96 %    02/05/21 0530 (!) 99/56 ??? 95 21 95 %    02/05/21 0515 (!) 97/52 ??? 96 13 95 %    02/05/21 0500 (!) 97/54 98.2 ??F (36.8 ??C) 98 22 94 %    02/05/21 0441 94/61 98.3 ??F (36.8 ??C) 94 16 94 %    02/05/21 0430 116/66 ??? 95 18 96 %    02/05/21 0415 105/62 ??? 100 18 93 %    02/05/21 0400 (!) 99/56 98.1 ??F (36.7 ??C) 96 23 94 %  2 L/min oxygen via nasal cannula   02/05/21 0345 115/64 ??? 100 25 99 %    02/05/21 0330 (!) 98/57 ??? 97 20 100 %    02/05/21 0315 (!) 94/54 ??? 97 18 99 %    02/05/21 0300 (!) 99/58 ??? (!) 103 25 94 %    02/05/21 0245 (!) 106/59 ??? 96 22 94 %    02/05/21 0230 (!) 91/55 97.6 ??F (36.4 ??C) 98 21 95 %    02/05/21 0212 (!) 89/57 97.5 ??F (36.4 ??C) 98 ??? ???    02/05/21 0200 (!) 99/51 97.7 ??F (36.5 ??C) 100 24 99 %  2 L/min via nasal cannula   02/05/21 0130 (!) 99/47 ??? 98 21 100 %    02/05/21 0110 (!) 101/50 97.7 ??F (36.5 ??C) (!) 103 26 100 %    02/05/21 0105 (!) 112/57 ??? (!) 104 26 100 %    02/05/21 0100 (!) 121/100 ??? (!) 109 17 100 %    02/05/21 0055 (!) 110/57 ??? (!) 109 23 100 %    02/05/21 0050 (!) 109/54 ??? (!) 105 21 100 %    02/05/21 0045 98/66 ??? (!) 104 20 93 %    02/05/21 0040 (!) 112/93 97.8 ??F (36.6 ??C) (!) 113 25 95 %    02/05/21 0035 (!) 106/49 ??? (!) 109 25 100 %    02/05/21 0030 (!) 101/52 ??? (!) 106 26 91 %  02/05/21 0025 (!) 91/52 ??? (!) 108 24 91 %    02/05/21 0023 111/80 97.8 ??F (36.6 ??C) (!) 113 22 95 % room air   02/05/21 0020 111/80 ??? (!) 104 27 (!) 88 % room air   02/05/21 0015 ??? ??? (!) 105 30 93 %  room air   02/05/21 0010 (!) 105/59 ??? (!) 104 20 92 % room air   02/05/21 0007 (!) 98/51 98 ??F (36.7 ??C) (!) 109 22 95 % room air   02/05/21 0005 ??? 98.1 ??F (36.7 ??C) (!) 108 26 97 % room air   02/05/21 0000 (!) 93/50 ??? (!) 109 18 ???    02/04/21 2203 ??? ??? (!) 113 ??? ???    02/04/21 2156 (!) 84/48 97.7 ??F (36.5 ??C) (!) 114  (!) 32 96 % room air     Weight: 48.5 kg    General: In no acute distress.  Well developed, well nourished.  Head: Normocephalic, atraumatic.  Eyes: Anicteric sclera.  PERRL.  Extraocular muscles intact.  ENT: External ears and nose appear normal.  Oral mucosa moist.  Neck: Supple.  No jugular venous distention.  Heart: Regular rate and rhythm.  No murmurs appreciated.  Chest: Symmetrical excursion.  Clear to auscultation bilaterally.  Abdomen: Soft.  Patient did not grimace with palpation of abdomen.  No abnormal distention. Bowel sounds are present throughout.  Extremities: No gross deformities.  No edema, no cyanosis.    Neurological: Moving all extremities spontaneously.  Somnolent but arousable.  Skin: No jaundice.  No rashes.        Recent Results (from the past 24 hour(s))   RBC, ALLOCATE    Collection Time: 02/04/21 10:15 PM   Result Value Ref Range    HISTORY CHECKED? Historical check performed    CBC WITH AUTOMATED DIFF    Collection Time: 02/04/21 10:22 PM   Result Value Ref Range    WBC 13.9 (H) 3.6 - 11.0 K/uL    RBC 1.61 (L) 3.80 - 5.20 M/uL    HGB 4.8 (LL) 11.5 - 16.0 g/dL    HCT 15.3 (LL) 35.0 - 47.0 %    MCV 95.0 80.0 - 99.0 FL    MCH 29.8 26.0 - 34.0 PG    MCHC 31.4 30.0 - 36.5 g/dL    RDW 18.9 (H) 11.5 - 14.5 %    PLATELET 318 150 - 400 K/uL    MPV 9.4 8.9 - 12.9 FL    NRBC 0.9 (H) 0 PER 100 WBC    ABSOLUTE NRBC 0.12 (H) 0.00 - 0.01 K/uL    NEUTROPHILS 59 32 - 75 %    LYMPHOCYTES 35 12 - 49 %    MONOCYTES 6 5 - 13 %    EOSINOPHILS 0 0 - 7 %    BASOPHILS 0 0 - 1 %    IMMATURE GRANULOCYTES 0 %    ABS. NEUTROPHILS 8.2 (H) 1.8 - 8.0 K/UL    ABS. LYMPHOCYTES 4.9 (H) 0.8 - 3.5 K/UL    ABS. MONOCYTES 0.8 0.0 - 1.0 K/UL    ABS. EOSINOPHILS 0.0 0.0 - 0.4 K/UL    ABS. BASOPHILS 0.0 0.0 - 0.1 K/UL    ABS. IMM. GRANS. 0.0 K/UL    DF MANUAL      RBC COMMENTS ANISOCYTOSIS  1+        RBC COMMENTS MACROCYTOSIS  1+       METABOLIC PANEL, COMPREHENSIVE    Collection Time: 02/04/21 10:22 PM  Result Value Ref Range     Sodium 136 136 - 145 mmol/L    Potassium 5.2 (H) 3.5 - 5.1 mmol/L    Chloride 109 (H) 97 - 108 mmol/L    CO2 17 (L) 21 - 32 mmol/L    Anion gap 10 5 - 15 mmol/L    Glucose 286 (H) 65 - 100 mg/dL    BUN 82 (H) 6 - 20 MG/DL    Creatinine 1.34 (H) 0.55 - 1.02 MG/DL    BUN/Creatinine ratio 61 (H) 12 - 20      GFR est AA 47 (L) >60 ml/min/1.46m    GFR est non-AA 39 (L) >60 ml/min/1.747m   Calcium 7.9 (L) 8.5 - 10.1 MG/DL    Bilirubin, total 0.2 0.2 - 1.0 MG/DL    ALT (SGPT) 22 12 - 78 U/L    AST (SGOT) 17 15 - 37 U/L    Alk. phosphatase 48 45 - 117 U/L    Protein, total 5.9 (L) 6.4 - 8.2 g/dL    Albumin 2.3 (L) 3.5 - 5.0 g/dL    Globulin 3.6 2.0 - 4.0 g/dL    A-G Ratio 0.6 (L) 1.1 - 2.2     MAGNESIUM    Collection Time: 02/04/21 10:22 PM   Result Value Ref Range    Magnesium 1.6 1.6 - 2.4 mg/dL   PROTHROMBIN TIME + INR    Collection Time: 02/04/21 10:22 PM   Result Value Ref Range    INR 1.2 (H) 0.9 - 1.1      Prothrombin time 12.8 (H) 9.0 - 11.1 sec   CULTURE, BLOOD, PAIRED    Collection Time: 02/04/21 10:22 PM    Specimen: Blood   Result Value Ref Range    Special Requests: NO SPECIAL REQUESTS      Culture result: NO GROWTH AFTER 6 HOURS     LACTIC ACID    Collection Time: 02/04/21 10:22 PM   Result Value Ref Range    Lactic acid 4.9 (HH) 0.4 - 2.0 MMOL/L   LIPASE    Collection Time: 02/04/21 10:22 PM   Result Value Ref Range    Lipase 23 (L) 73 - 393 U/L   TYPE & SCREEN    Collection Time: 02/04/21 10:22 PM   Result Value Ref Range    Crossmatch Expiration 02/07/2021,2359     ABO/Rh(D) O POSITIVE     Antibody screen NEG     Unit number [id number]   Blood component type RC LR,1     Unit division 00     Status of unit TRANSFUSED     Crossmatch result Compatible     Unit number [id number]   Blood component type RC LR,1     Unit division 00     Status of unit ISSUED     Crossmatch result Compatible     Unit number [id number]   Blood component type RC LR,1     Unit division 00     Status of unit ISSUED      Crossmatch result Compatible    PHOSPHORUS    Collection Time: 02/04/21 10:22 PM   Result Value Ref Range    Phosphorus 3.2 2.6 - 4.7 MG/DL   OCCULT BLOOD, STOOL    Collection Time: 02/04/21 10:59 PM   Result Value Ref Range    Occult blood, stool Positive (A) NEG     RBC, ALLOCATE    Collection Time: 02/04/21 11:00  PM   Result Value Ref Range    HISTORY CHECKED? Historical check performed    METABOLIC PANEL, COMPREHENSIVE    Collection Time: 02/05/21  4:02 AM   Result Value Ref Range    Sodium 138 136 - 145 mmol/L    Potassium 4.5 3.5 - 5.1 mmol/L    Chloride 112 (H) 97 - 108 mmol/L    CO2 20 (L) 21 - 32 mmol/L    Anion gap 6 5 - 15 mmol/L    Glucose 196 (H) 65 - 100 mg/dL    BUN 66 (H) 6 - 20 MG/DL    Creatinine 1.07 (H) 0.55 - 1.02 MG/DL    BUN/Creatinine ratio 62 (H) 12 - 20      GFR est AA >60 >60 ml/min/1.55m    GFR est non-AA 50 (L) >60 ml/min/1.767m   Calcium 7.6 (L) 8.5 - 10.1 MG/DL    Bilirubin, total 0.4 0.2 - 1.0 MG/DL    ALT (SGPT) 20 12 - 78 U/L    AST (SGOT) 14 (L) 15 - 37 U/L    Alk. phosphatase 45 45 - 117 U/L    Protein, total 5.7 (L) 6.4 - 8.2 g/dL    Albumin 2.2 (L) 3.5 - 5.0 g/dL    Globulin 3.5 2.0 - 4.0 g/dL    A-G Ratio 0.6 (L) 1.1 - 2.2     CBC WITH AUTOMATED DIFF    Collection Time: 02/05/21  4:02 AM   Result Value Ref Range    WBC 13.2 (H) 3.6 - 11.0 K/uL    RBC 2.50 (L) 3.80 - 5.20 M/uL    HGB 7.6 (L) 11.5 - 16.0 g/dL    HCT 23.5 (L) 35.0 - 47.0 %    MCV 94.0 80.0 - 99.0 FL    MCH 30.4 26.0 - 34.0 PG    MCHC 32.3 30.0 - 36.5 g/dL    RDW 15.7 (H) 11.5 - 14.5 %    PLATELET 263 150 - 400 K/uL    MPV 9.1 8.9 - 12.9 FL    NRBC 0.9 (H) 0 PER 100 WBC    ABSOLUTE NRBC 0.12 (H) 0.00 - 0.01 K/uL    NEUTROPHILS 65 32 - 75 %    LYMPHOCYTES 27 12 - 49 %    MONOCYTES 6 5 - 13 %    EOSINOPHILS 0 0 - 7 %    BASOPHILS 1 0 - 1 %    IMMATURE GRANULOCYTES 1 (H) 0.0 - 0.5 %    ABS. NEUTROPHILS 8.6 (H) 1.8 - 8.0 K/UL    ABS. LYMPHOCYTES 3.5 0.8 - 3.5 K/UL    ABS. MONOCYTES 0.8 0.0 - 1.0 K/UL    ABS.  EOSINOPHILS 0.0 0.0 - 0.4 K/UL    ABS. BASOPHILS 0.1 0.0 - 0.1 K/UL    ABS. IMM. GRANS. 0.2 (H) 0.00 - 0.04 K/UL    DF AUTOMATED     NT-PRO BNP    Collection Time: 02/05/21  4:02 AM   Result Value Ref Range    NT pro-BNP 1,035 (H) <450 PG/ML   LIPASE    Collection Time: 02/05/21  4:02 AM   Result Value Ref Range    Lipase 25 (L) 73 - 393 U/L       CT ABD PELV W WO CONT of the night of 02/04/2021  Narrative: INDICATION: GI bleed    EXAM:  CT ABDOMEN, PELVIS WITH/WITHOUT CONTRAST (GI Bleed PROTOCOL)    COMPARISON: None  TECHNIQUE: Noncontrast 2.5 mm axial images were obtained through the abdomen and  pelvis. After the uneventful administration of IV contrast, 2.5 mm axial images  were obtained through the abdomen and pelvis during arterial and portal venous  phases.  Delayed images were also obtained through the abdomen and pelvis.    Coronal and sagittal reconstructions were generated.      CT dose reduction was achieved through use of a standardized protocol tailored  for this examination and automatic exposure control for dose modulation.    FINDINGS:     LUNG BASES: Emphysema and bronchial wall thickening.  LIVER: No mass or biliary dilatation.  GALLBLADDER: Surgically absent.  SPLEEN: No enlargement or lesion.  PANCREAS: Small ill-defined hypodense/cystic foci pancreatic head in total  approximately 9 x 7 mm.  ADRENALS: No mass.  KIDNEYS: Multiple hypodensities both kidneys likely small cysts, difficult  characterize due to their small size. Multiple small bilateral calculi without  hydronephrosis.  GI TRACT: No bowel obstruction. Possible gastric/proximal duodenal wall  thickening though oral contrast not administered. Colon also not well distended.   No evidence of hemorrhage.  PERITONEUM: No free air or free fluid.  APPENDIX: Unremarkable.  RETROPERITONEUM: No lymphadenopathy or aortic aneurysm.  ADDITIONAL COMMENTS: N/A.    URINARY BLADDER: Unremarkable.  REPRODUCTIVE ORGANS: Prior hysterectomy.  LYMPH  NODES: None enlarged.  FREE FLUID: None.  BONES: No destructive bone lesion.  ADDITIONAL COMMENTS: N/A.  Impression: 1. No CT evidence of acute GI bleed.  2. Possible distal gastric/proximal duodenal wall thickening, could be due to  underdistention or an infectious/inflammatory process. Colon also not well  distended. No bowel obstruction.  3. Bilateral nephrolithiasis with no hydronephrosis.  4. 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

## 2021-02-06 ENCOUNTER — Inpatient Hospital Stay: Admit: 2021-02-06 | Payer: MEDICARE

## 2021-02-06 LAB — CBC WITH AUTOMATED DIFF
ABS. BASOPHILS: 0.1 10*3/uL (ref 0.0–0.1)
ABS. EOSINOPHILS: 0.1 10*3/uL (ref 0.0–0.4)
ABS. IMM. GRANS.: 0.2 10*3/uL — ABNORMAL HIGH (ref 0.00–0.04)
ABS. LYMPHOCYTES: 3.8 10*3/uL — ABNORMAL HIGH (ref 0.8–3.5)
ABS. MONOCYTES: 0.8 10*3/uL (ref 0.0–1.0)
ABS. NEUTROPHILS: 6.9 10*3/uL (ref 1.8–8.0)
ABSOLUTE NRBC: 0.35 10*3/uL — ABNORMAL HIGH (ref 0.00–0.01)
BASOPHILS: 1 % (ref 0–1)
EOSINOPHILS: 1 % (ref 0–7)
HCT: 24.5 % — ABNORMAL LOW (ref 35.0–47.0)
HGB: 8 g/dL — ABNORMAL LOW (ref 11.5–16.0)
IMMATURE GRANULOCYTES: 2 % — ABNORMAL HIGH (ref 0.0–0.5)
LYMPHOCYTES: 32 % (ref 12–49)
MCH: 30 PG (ref 26.0–34.0)
MCHC: 32.7 g/dL (ref 30.0–36.5)
MCV: 91.8 FL (ref 80.0–99.0)
MONOCYTES: 7 % (ref 5–13)
MPV: 8.5 FL — ABNORMAL LOW (ref 8.9–12.9)
NEUTROPHILS: 57 % (ref 32–75)
NRBC: 2.9 PER 100 WBC — ABNORMAL HIGH
PLATELET: 228 10*3/uL (ref 150–400)
RBC: 2.67 M/uL — ABNORMAL LOW (ref 3.80–5.20)
RDW: 17.3 % — ABNORMAL HIGH (ref 11.5–14.5)
WBC: 11.9 10*3/uL — ABNORMAL HIGH (ref 3.6–11.0)

## 2021-02-06 LAB — TYPE & SCREEN
ABO/Rh(D): O POS
Antibody screen: NEGATIVE
Status of unit: TRANSFUSED
Status of unit: TRANSFUSED
Status of unit: TRANSFUSED
Unit division: 0
Unit division: 0
Unit division: 0

## 2021-02-06 LAB — FACTOR X ACTIVITY: Factor X Activity: 165 % (ref 76–183)

## 2021-02-06 LAB — METABOLIC PANEL, BASIC
Anion gap: 5 mmol/L (ref 5–15)
BUN/Creatinine ratio: 40 — ABNORMAL HIGH (ref 12–20)
BUN: 36 MG/DL — ABNORMAL HIGH (ref 6–20)
CO2: 22 mmol/L (ref 21–32)
Calcium: 8 MG/DL — ABNORMAL LOW (ref 8.5–10.1)
Chloride: 111 mmol/L — ABNORMAL HIGH (ref 97–108)
Creatinine: 0.91 MG/DL (ref 0.55–1.02)
GFR est AA: >60 mL/min/{1.73_m2} (ref >60)
GFR est non-AA: >60 mL/min/{1.73_m2} (ref >60)
Glucose: 94 mg/dL (ref 65–100)
Potassium: 4.2 mmol/L (ref 3.5–5.1)
Sodium: 138 mmol/L (ref 136–145)

## 2021-02-06 LAB — PROTHROMBIN TIME + INR
INR: 1.1 (ref 0.9–1.1)
Prothrombin time: 11.6 s — ABNORMAL HIGH (ref 9.0–11.1)

## 2021-02-06 LAB — HGB & HCT
HCT: 22.5 % — ABNORMAL LOW (ref 35.0–47.0)
HGB: 7.5 g/dL — ABNORMAL LOW (ref 11.5–16.0)

## 2021-02-06 LAB — COVID-19 RAPID TEST: COVID-19 rapid test: NOT DETECTED

## 2021-02-06 LAB — PROCALCITONIN
Procalcitonin: <0.05 ng/mL
Procalcitonin: <0.05 ng/mL

## 2021-02-06 LAB — SAMPLES BEING HELD: SAMPLES BEING HELD: 1

## 2021-02-06 LAB — TYPE AND SCREEN
ABO/Rh: O POS
Antibody Screen: NEGATIVE
Status: TRANSFUSED
Status: TRANSFUSED
Status: TRANSFUSED
Unit Divison: 0
Unit Divison: 0
Unit Divison: 0

## 2021-02-06 LAB — CBC WITH AUTO DIFFERENTIAL
Basophils %: 1 % (ref 0–1)
Basophils Absolute: 0.1 10*3/uL (ref 0.0–0.1)
Eosinophils %: 1 % (ref 0–7)
Eosinophils Absolute: 0.1 10*3/uL (ref 0.0–0.4)
Granulocyte Absolute Count: 0.2 10*3/uL — ABNORMAL HIGH (ref 0.00–0.04)
Hematocrit: 24.5 % — ABNORMAL LOW (ref 35.0–47.0)
Hemoglobin: 8 g/dL — ABNORMAL LOW (ref 11.5–16.0)
Immature Granulocytes: 2 % — ABNORMAL HIGH (ref 0.0–0.5)
Lymphocytes %: 32 % (ref 12–49)
Lymphocytes Absolute: 3.8 10*3/uL — ABNORMAL HIGH (ref 0.8–3.5)
MCH: 30 PG (ref 26.0–34.0)
MCHC: 32.7 g/dL (ref 30.0–36.5)
MCV: 91.8 FL (ref 80.0–99.0)
MPV: 8.5 FL — ABNORMAL LOW (ref 8.9–12.9)
Monocytes %: 7 % (ref 5–13)
Monocytes Absolute: 0.8 10*3/uL (ref 0.0–1.0)
NRBC Absolute: 0.35 10*3/uL — ABNORMAL HIGH (ref 0.00–0.01)
Neutrophils %: 57 % (ref 32–75)
Neutrophils Absolute: 6.9 10*3/uL (ref 1.8–8.0)
Nucleated RBCs: 2.9 PER 100 WBC — ABNORMAL HIGH
Platelets: 228 10*3/uL (ref 150–400)
RBC: 2.67 M/uL — ABNORMAL LOW (ref 3.80–5.20)
RDW: 17.3 % — ABNORMAL HIGH (ref 11.5–14.5)
WBC: 11.9 10*3/uL — ABNORMAL HIGH (ref 3.6–11.0)

## 2021-02-06 LAB — BASIC METABOLIC PANEL
Anion Gap: 5 mmol/L (ref 5–15)
BUN: 36 MG/DL — ABNORMAL HIGH (ref 6–20)
Bun/Cre Ratio: 40 — ABNORMAL HIGH (ref 12–20)
CO2: 22 mmol/L (ref 21–32)
Calcium: 8 MG/DL — ABNORMAL LOW (ref 8.5–10.1)
Chloride: 111 mmol/L — ABNORMAL HIGH (ref 97–108)
Creatinine: 0.91 MG/DL (ref 0.55–1.02)
EGFR IF NonAfrican American: >60 mL/min/{1.73_m2} (ref >60)
GFR African American: >60 mL/min/{1.73_m2} (ref >60)
Glucose: 94 mg/dL (ref 65–100)
Potassium: 4.2 mmol/L (ref 3.5–5.1)
Sodium: 138 mmol/L (ref 136–145)

## 2021-02-06 LAB — HEMOGLOBIN AND HEMATOCRIT
Hematocrit: 22.5 % — ABNORMAL LOW (ref 35.0–47.0)
Hemoglobin: 7.5 g/dL — ABNORMAL LOW (ref 11.5–16.0)

## 2021-02-06 LAB — COVID-19, RAPID: SARS-CoV-2, Rapid: NOT DETECTED

## 2021-02-06 LAB — PROTIME-INR
INR: 1.1 (ref 0.9–1.1)
Protime: 11.6 s — ABNORMAL HIGH (ref 9.0–11.1)

## 2021-02-06 MED ORDER — IOPAMIDOL 76 % IV SOLN
370 mg iodine /mL (76 %) | Freq: Once | INTRAVENOUS | Status: AC
Start: 2021-02-06 — End: 2021-02-06
  Administered 2021-02-06: 23:00:00 via INTRAVENOUS

## 2021-02-06 MED ORDER — IRON SUCROSE 100 MG/5 ML IV SOLN
100 mg iron/5 mL | Freq: Once | INTRAVENOUS | Status: AC
Start: 2021-02-06 — End: 2021-02-05
  Administered 2021-02-06: 03:00:00 via INTRAVENOUS

## 2021-02-06 MED ORDER — PROPOFOL 10 MG/ML IV EMUL
10 mg/mL | INTRAVENOUS | Status: DC | PRN
Start: 2021-02-06 — End: 2021-02-06
  Administered 2021-02-06: 20:00:00 via INTRAVENOUS

## 2021-02-06 MED ORDER — MORPHINE 2 MG/ML INJECTION
2 mg/mL | INTRAMUSCULAR | Status: DC | PRN
Start: 2021-02-06 — End: 2021-02-10
  Administered 2021-02-06 (×4): via INTRAVENOUS

## 2021-02-06 MED ORDER — EPINEPHRINE 0.1 MG/ML SYRINGE
0.1 mg/mL | Freq: Once | INTRAMUSCULAR | Status: DC | PRN
Start: 2021-02-06 — End: 2021-02-06

## 2021-02-06 MED ORDER — SIMETHICONE 40 MG/0.6 ML ORAL DROPS, SUSP
40 mg/0.6 mL | ORAL | Status: DC | PRN
Start: 2021-02-06 — End: 2021-02-06

## 2021-02-06 MED ORDER — LIDOCAINE (PF) 20 MG/ML (2 %) IJ SOLN
20 mg/mL (2 %) | INTRAMUSCULAR | Status: DC | PRN
Start: 2021-02-06 — End: 2021-02-06
  Administered 2021-02-06: 20:00:00 via INTRAVENOUS

## 2021-02-06 MED ORDER — ALBUMIN, HUMAN 25 % IV
25 % | Freq: Once | INTRAVENOUS | Status: AC
Start: 2021-02-06 — End: 2021-02-06
  Administered 2021-02-06: 14:00:00 via INTRAVENOUS

## 2021-02-06 MED ORDER — IOPAMIDOL 76 % IV SOLN
76 % | INTRAVENOUS | Status: AC
Start: 2021-02-06 — End: 2021-02-07

## 2021-02-06 MED ORDER — ATROPINE 0.1 MG/ML SYRINGE
0.1 mg/mL | Freq: Once | INTRAMUSCULAR | Status: DC | PRN
Start: 2021-02-06 — End: 2021-02-06

## 2021-02-06 MED ORDER — SODIUM CHLORIDE 0.9 % IV
INTRAVENOUS | Status: AC
Start: 2021-02-06 — End: 2021-02-06
  Administered 2021-02-06: 19:00:00 via INTRAVENOUS

## 2021-02-06 MED ORDER — MIDAZOLAM 1 MG/ML IJ SOLN
1 mg/mL | INTRAMUSCULAR | Status: DC | PRN
Start: 2021-02-06 — End: 2021-02-06

## 2021-02-06 MED ORDER — LIDOCAINE 4 % TOPICAL PATCH (12 HOUR DURATION)
4 % | CUTANEOUS | Status: DC
Start: 2021-02-06 — End: 2021-02-10

## 2021-02-06 MED ORDER — NALOXONE 0.4 MG/ML INJECTION
0.4 mg/mL | INTRAMUSCULAR | Status: DC | PRN
Start: 2021-02-06 — End: 2021-02-06

## 2021-02-06 MED ORDER — PHENYLEPHRINE IN 0.9 % SODIUM CL (40 MCG/ML) IV SYRINGE
0.4 mg/10 mL (40 mcg/mL) | INTRAVENOUS | Status: DC | PRN
Start: 2021-02-06 — End: 2021-02-06
  Administered 2021-02-06: 20:00:00 via INTRAVENOUS

## 2021-02-06 MED ORDER — FLUMAZENIL 0.1 MG/ML IV SOLN
0.1 mg/mL | INTRAVENOUS | Status: DC | PRN
Start: 2021-02-06 — End: 2021-02-06

## 2021-02-06 MED FILL — VENOFER 100 MG IRON/5 ML INTRAVENOUS SOLUTION: 100 mg iron/5 mL | INTRAVENOUS | Qty: 5

## 2021-02-06 MED FILL — MORPHINE 2 MG/ML INJECTION: 2 mg/mL | INTRAMUSCULAR | Qty: 1

## 2021-02-06 MED FILL — PROTONIX 40 MG INTRAVENOUS SOLUTION: 40 mg | INTRAVENOUS | Qty: 40

## 2021-02-06 MED FILL — LIDOCAINE 4 % TOPICAL PATCH (12 HOUR DURATION): 4 % | CUTANEOUS | Qty: 1

## 2021-02-06 MED FILL — PHENYLEPHRINE 10 MG/ML INJECTION: 10 mg/mL | INTRAMUSCULAR | Qty: 3

## 2021-02-06 MED FILL — METRONIDAZOLE IN SODIUM CHLORIDE (ISO-OSM) 500 MG/100 ML IV PIGGY BACK: 500 mg/100 mL | INTRAVENOUS | Qty: 100

## 2021-02-06 MED FILL — FLEXBUMIN 25 % INTRAVENOUS SOLUTION: 25 % | INTRAVENOUS | Qty: 100

## 2021-02-06 MED FILL — ISOVUE-370  76 % INTRAVENOUS SOLUTION: 370 mg iodine /mL (76 %) | INTRAVENOUS | Qty: 100

## 2021-02-06 MED FILL — SODIUM CHLORIDE 0.9 % IV: INTRAVENOUS | Qty: 250

## 2021-02-06 NOTE — Progress Notes (Signed)
Pt transported via transport accompanied by RN and on [place].

## 2021-02-06 NOTE — Progress Notes (Signed)
Occupational Therapy Note:  Chart reviewed and spoke with nursing.  Patient is currently undergoing EGD and unavailable for OT evaluation.  Will continue to follow.  Waunita Schooner, OTR/L

## 2021-02-06 NOTE — Progress Notes (Signed)
 Progress  Notes by Zina Camps, RN at 02/06/21 1913                Author: Zina Camps, RN  Service: --  Author Type: Registered Nurse       Filed: 02/07/21 0015  Date of Service: 02/06/21 1913  Status: Signed          Editor: Zina Camps, RN (Registered Nurse)               [phone] Bedside and Verbal shift change report given to Nasrin, RN (oncoming nurse) by Soyla, RN (offgoing  nurse). Report included the following information SBAR, Intake/Output, Recent Results, Med Rec Status, Cardiac Rhythm NSR and Alarm Parameters  .       Patient Vitals for the past 12 hrs:            Temp  Pulse  Resp  BP  SpO2            02/06/21 2330  --  92  17  (!) 87/63  94 %            02/06/21 2320  --  94  20  (!) 95/52  95 %     02/06/21 2300  --  95  21  --  97 %     02/06/21 2200  --  95  19  (!) 91/51  94 %     02/06/21 2100  --  85  20  (!) 93/57  97 %     02/06/21 2000  --  82  18  (!) 95/52  100 %     02/06/21 1915  98.8 F (37.1 C)  80  17  --  97 %     02/06/21 1900  --  83  18  (!) 94/51  97 %     02/06/21 1830  --  87  17  (!) 95/51  98 %     02/06/21 1730  --  97  12  119/63  94 %     02/06/21 1700  --  92  21  (!) 104/58  92 %     02/06/21 1630  --  88  17  (!) 96/58  94 %     02/06/21 1600  --  74  16  (!) 108/58  94 %     02/06/21 1551  97.9 F (36.6 C)  --  --  --  --     02/06/21 1510  98 F (36.7 C)  --  --  --  --     02/06/21 1335  98 F (36.7 C)  74  12  (!) 113/56  100 %            02/06/21 1230  --  75  16  (!) 99/54  97 %          Lab Results 24Hours               Procedure  Component  Value  Units  Date/Time           SAMPLES BEING HELD [[id number]]  Collected: 02/06/21 0903            Order Status: Completed  Specimen: Miscellaneous sample  Updated: 02/06/21 1355              SAMPLES BEING HELD  1 RED TOP SWAB               COMMENT  Add-on orders for these samples will be processed based on acceptable specimen integrity and analyte stability, which may vary by analyte.                      FACTOR X ACTIVITY [[id number]]  Collected: 02/05/21 0402            Order Status: Completed  Specimen: Blood from Plasma  Updated: 02/06/21 1036                Factor X Activity  165  %             PROCALCITONIN [[id number]]  Collected: 02/06/21 0345            Order Status: Completed  Specimen: Serum  Updated: 02/06/21 0500                Procalcitonin  <0.05  ng/mL             METABOLIC PANEL, BASIC [[id number]]  (Abnormal)  Collected: 02/06/21 0345            Order Status: Completed  Specimen: Serum or Plasma  Updated: 02/06/21 0454                Sodium  138  mmol/L           Potassium  4.2  mmol/L           Chloride  111    mmol/L           CO2  22  mmol/L           Anion gap  5  mmol/L           Glucose  94  mg/dL           BUN  36    MG/DL           Creatinine  9.08  MG/DL           BUN/Creatinine ratio  40                GFR est AA  >60  ml/min/1.77m2           GFR est non-AA  >60  ml/min/1.39m2           Calcium  8.0    MG/DL             PROTHROMBIN TIME / INR [[id number]]  (Abnormal)  Collected: 02/06/21 0345            Order Status: Completed  Specimen: Blood from Plasma  Updated: 02/06/21 0429                INR  1.1              Prothrombin time  11.6    sec             CBC WITH AUTOMATED DIFF [[id number]]  (Abnormal)  Collected: 02/06/21 0345            Order Status: Completed  Specimen: Whole Blood  Updated: 02/06/21 0417                WBC  11.9    K/uL           RBC  2.67    M/uL           HGB  8.0    g/dL           HCT  75.4    %  MCV  91.8  FL           MCH  30.0  PG           MCHC  32.7  g/dL           RDW  82.6    %           PLATELET  228  K/uL           MPV  8.5    FL           NRBC  2.9    PER 100 WBC           ABSOLUTE NRBC  0.35    K/uL           NEUTROPHILS  57  %           LYMPHOCYTES  32  %           MONOCYTES  7  %           EOSINOPHILS  1  %           BASOPHILS  1  %           IMMATURE GRANULOCYTES  2    %           ABS. NEUTROPHILS  6.9  K/UL           ABS. LYMPHOCYTES  3.8     K/UL           ABS. MONOCYTES  0.8  K/UL           ABS. EOSINOPHILS  0.1  K/UL           ABS. BASOPHILS  0.1  K/UL           ABS. IMM. GRANS.  0.2    K/UL                  DF  AUTOMATED                  See flowsheet for complete assessment   See MAR for medication administration and drip titration.      0700 Bedside and Verbal shift change report given to Will, RN (Cabin crew) by Jacquetta, RN (offgoing nurse). Report included the following  information SBAR, Intake/Output, Med Rec Status, Cardiac Rhythm NSR and Alarm Parameters .

## 2021-02-06 NOTE — Progress Notes (Signed)
Physical Therapy orders acknowledged, chart reviewed and discussed with nurse patient off the floor for a procedure. We will continue to follow up with the patient for therapy thank you.

## 2021-02-06 NOTE — Interval H&P Note (Signed)
Mariyana Zimmerle  Feb 11, 1946  [id number]    Situation:    Scheduled Procedure: Procedure(s):  ESOPHAGOGASTRODUODENOSCOPY (EGD)  Verbal report received from: Will, RN  Preoperative diagnosis: melena    Background:    Procedure: Procedure(s):  ESOPHAGOGASTRODUODENOSCOPY (EGD)  Physician performing procedure; Dr. Delana Meyer, MD    SBAR QUESTIONS FLOOR TO ENDO RN    NPO Status/Last PO Intake: yes    Is the patient taking Blood Thinners: YES If yes, list: Eliquis and last taken 02/04/21  Is the patient diabetic:no          Does the patient have a Pacemaker/Defibrillator in place?: no   Does the patient need antibiotics before/during/after procedure: no     Does the patient have SCD in place:no   Is patient on CONTACT precautions:no        Assessment:  Are the vital signs stable prior to patient coming to ENDO?  yes  Is the patient alert/oriented and able to sign consent for the procedures:yes  How does the patient's abdomen feel prior to coming to ENDO? round and soft yes   Does the patient have a patient IV in place? Yes L 20G FA, L18 AC     Recommendation:  Family or Friend present no     Permission to share finding with Family or Friend yes

## 2021-02-06 NOTE — Interval H&P Note (Signed)
1430  TRANSFER - IN REPORT:    Verbal report received from Josephina Shih, RN on Savannah Irwin  being received from 3003 for ordered procedure      Report consisted of patient's Situation, Background, Assessment and   Recommendations(SBAR).     Information from the following report(s) SBAR, MAR and Recent Results was reviewed with the receiving nurse.    Opportunity for questions and clarification was provided.      Assessment completed upon patient's arrival to unit and care assumed.     1435  Timeout performed.  Anesthesia staff at patient's bedside administering anesthesia and monitoring patients vital signs throughout procedure. See anesthesia note. Post procedure, report received from CRNA,    Pricilla Larsson.    1444  Endoscope was pre-cleaned at bedside immediately following procedure by endo tech,    Ayesha Rumpf.    1447  Patient tolerated procedure.   Abdomen soft and patient arousable and voices no complaints.   Patient transported to endoscopy recovery area.   Report given to post procedure RN,   Wannetta Sender.    1455  TRANSFER - OUT REPORT:    Verbal report given to Gentry Fitz, RN on Savannah Irwin  being transferred to 3003 for routine progression of care       Report consisted of patient's Situation, Background, Assessment and   Recommendations(SBAR).     Information from the following report(s) SBAR and Procedure Summary was reviewed with the receiving nurse.    Lines:   Peripheral IV 02/04/21 Left Forearm (Active)   Site Assessment Clean, dry, & intact 02/06/21 0400   Phlebitis Assessment 0 02/06/21 0400   Infiltration Assessment 0 02/06/21 0400   Dressing Status Clean, dry, & intact 02/06/21 0400   Dressing Type Transparent 02/06/21 0400   Hub Color/Line Status Pink;Patent;Infusing 02/06/21 0400   Action Taken Open ports on tubing capped 02/06/21 0400   Alcohol Cap Used Yes 02/06/21 0400       Peripheral IV 02/04/21 Left Antecubital (Active)   Site Assessment Clean, dry, & intact 02/06/21 0400    Phlebitis Assessment 0 02/06/21 0400   Infiltration Assessment 0 02/06/21 0400   Dressing Status Clean, dry, & intact 02/06/21 0400   Dressing Type Transparent 02/06/21 0400   Hub Color/Line Status Green;Infusing 02/06/21 0400   Action Taken Open ports on tubing capped 02/06/21 0400   Alcohol Cap Used Yes 02/06/21 0400        Opportunity for questions and clarification was provided.      Patient transported with:   O2 @ 2 liters   Pt chart  Upper dentures  Foley in place  2 patent IV's  IV pump running neo

## 2021-02-06 NOTE — Progress Notes (Signed)
Progress  Notes by Glenetta Hew, MD at 02/06/21 1250                Author: Glenetta Hew, MD  Service: Hospitalist  Author Type: Physician       Filed: 02/06/21 1301  Date of Service: 02/06/21 1250  Status: Signed          Editor: Glenetta Hew, MD (Physician)                          [facility]   [address] [place], [place] [address]   [phone]         Medical Progress Note         NAME: Savannah Irwin    DOB:  13-May-1946   MRM:  [id number]      Date/Time: 02/06/2021  12:51 PM                     Assessment / Plan:        Savannah Irwin is a 75 yo AAF with PMH of gastric ulcers and PE admitted with hemorrhagic shock and AKI 2/2 acute blood loss anemia from GI bleed    ??      #Hemorrhagic Shock: Still on very low dose phenylephrine. Hb now stable after 2u pRBC. Likely UGIB as presented with melena    - Scope today    - IV PPI BID    - Serial H/H    - Hold AC         #AHRF: No baseline oxygen requirements. Hx recent PE; however she has received copious fluid resuscitation as well as pRBCs. CXR with vascular congestion. Suggests need for diuresis, but requiring mild pressor support. Dopplers negtive. Per report, hx  HFpEF. Off AC, but less concern this is worsening PE    - Echo +/- diuresis    - CTA chest eval presence/burden of PE      #Duodenitis?: Started on empiric, abx. Can consider d/c these      #AKI - likely 2/2 hemorrhagic shock. Resolved   ??   #PreDM: Monitor. Ac 6.3%   ??   #H/o PE: Unclear to me when this was. Now that renal function has normalized, will plan to repeat. Dopplers neg    - Hold apixaban    - Repeat CTA after scope         CC Time exclusive of procedures: 35 min          Discussed:  Care Plan      Prophylaxis:  SCD's      Disposition:  Home w/Family             ___________________________________________________      Attending Physician: Glenetta Hew, MD                Subjective:        Chief Complaint:  NAEON. Still  on low dose phenylephrine.  Decent UOP. She is on higher oxygen today up to 5L  and has rales to mid lung. She is not in distress      ROS:   (bold if positive,  if negative)      Tolerating PT  Tolerating Diet                   Objective:             Vitals:  Last 24hrs VS reviewed since prior progress note. Most recent are:      Visit Vitals      BP  114/61     Pulse  73     Temp  98 ??F (36.7 ??C)     Resp  16     Ht  5\' 3"  (1.6 m)     Wt  48.5 kg (106 lb 14.8 oz)     SpO2  97%        BMI  18.94 kg/m??        SpO2 Readings from Last 6 Encounters:      02/06/21  97%           O2 Flow Rate (L/min): 5 l/min           Intake/Output Summary (Last 24 hours) at 02/06/2021 1251   Last data filed at 02/06/2021 1044     Gross per 24 hour        Intake  1888.08 ml        Output  2040 ml        Net  -151.92 ml                 Exam:        Physical Exam:      Gen:  Frail, elderly, nad   HEENT:  Pink conjunctivae, PERRL, hearing intact to voice, moist mucous membranes   Neck:  Supple, without masses, thyroid non-tender   Resp:  No accessory muscle use, rales to mi dlung   Card:  No murmurs, normal S1, S2 without thrills, bruits or peripheral edema   Abd:  Soft, non-tender, non-distended, normoactive bowel sounds are present   Musc:  No cyanosis or clubbing   Skin:  No rashes or ulcers, skin turgor is good   Neuro:  Cranial nerves 3-12 are grossly intact, grip strength is 5/5 bilaterally and dorsi / plantarflexion is 5/5 bilaterally, follows commands  appropriately   Psych:  Good insight, oriented to person, place and time, alert          Medications Reviewed: (see below)      Lab Data Reviewed: (see below)      ______________________________________________________________________        Medications:          Current Facility-Administered Medications          Medication  Dose  Route  Frequency           ?  sodium chloride (NS) flush 5-40 mL   5-40 mL  IntraVENous  Q8H     ?  sodium chloride (NS) flush 5-40 mL   5-40 mL  IntraVENous  PRN     ?   acetaminophen (TYLENOL) tablet 650 mg   650 mg  Oral  Q6H PRN          Or           ?  acetaminophen (TYLENOL) suppository 650 mg   650 mg  Rectal  Q6H PRN     ?  polyethylene glycol (MIRALAX) packet 17 g   17 g  Oral  DAILY PRN           ?  ondansetron (ZOFRAN) injection 4 mg   4 mg  IntraVENous  Q4H PRN           ?  pantoprazole (PROTONIX) 40 mg in 0.9% sodium chloride 10 mL injection   40 mg  IntraVENous  BID     ?  PHENYLephrine (NEO-SYNEPHRINE) 30 mg in 0.9% sodium chloride 250 mL infusion   10-300 mcg/min  IntraVENous  TITRATE     ?  lactated Ringers infusion   75 mL/hr  IntraVENous  CONTINUOUS     ?  levoFLOXacin (LEVAQUIN) 750 mg in D5W IVPB   750 mg  IntraVENous  Q48H     ?  metroNIDAZOLE (FLAGYL) IVPB premix 500 mg   500 mg  IntraVENous  Q12H     ?  morphine injection 2 mg   2 mg  IntraVENous  Q3H PRN     ?  lidocaine 4 % patch 1 Patch   1 Patch  TransDERmal  Q24H     ?  0.9% sodium chloride infusion 250 mL   250 mL  IntraVENous  PRN           ?  0.9% sodium chloride infusion 250 mL   250 mL  IntraVENous  PRN                    Lab Review:          Recent Labs                 02/06/21   0345  02/05/21   2002  02/05/21   0953  02/05/21   0402  02/05/21   0402  02/04/21   2222  02/04/21   2222     WBC  11.9*   --    --    --   13.2*   --   13.9*     HGB  8.0*  7.5*  8.6*    < >  7.6*    < >  4.8*     HCT  24.5*  22.5*  25.6*    < >  23.5*    < >  15.3*     PLT  228   --    --    --   263   --   318        < > = values in this interval not displayed.          Recent Labs             02/06/21   0345  02/05/21   0402  02/04/21   2222     NA  138  138  136     K  4.2  4.5  5.2*     CL  111*  112*  109*     CO2  22  20*  17*     GLU  94  196*  286*     BUN  36*  66*  82*     CREA  0.91  1.07*  1.34*     CA  8.0*  7.6*  7.9*     MG   --    --   1.6     PHOS   --    --   3.2     ALB   --   2.2*  2.3*     ALT   --   20  22          INR  1.1   --   1.2*        No components found for: [facility]

## 2021-02-06 NOTE — Progress Notes (Signed)
Problem: Pressure Injury - Risk of  Goal: *Prevention of pressure injury  Description: Document Braden Scale and appropriate interventions in the flowsheet.  Outcome: Progressing Towards Goal  Note: Pressure Injury Interventions:  Sensory Interventions: Assess changes in LOC    Moisture Interventions: Absorbent underpads,Assess need for specialty bed,Apply protective barrier, creams and emollients,Check for incontinence Q2 hours and as needed,Contain wound drainage,Internal/External urinary devices,Limit adult briefs,Minimize layers,Maintain skin hydration (lotion/cream),Moisture barrier,Offer toileting Q_hr    Activity Interventions: Increase time out of bed,Pressure redistribution bed/mattress(bed type),PT/OT evaluation    Mobility Interventions: HOB 30 degrees or less,Pressure redistribution bed/mattress (bed type),PT/OT evaluation    Nutrition Interventions: Document food/fluid/supplement intake    Friction and Shear Interventions: Apply protective barrier, creams and emollients,HOB 30 degrees or less,Lift team/patient mobility team                Problem: Falls - Risk of  Goal: *Absence of Falls  Description: Document Schmid Fall Risk and appropriate interventions in the flowsheet.  Outcome: Progressing Towards Goal  Note: Fall Risk Interventions:  Mobility Interventions: Patient to call before getting OOB    Mentation Interventions: Room close to nurse's station    Medication Interventions: Patient to call before getting OOB    Elimination Interventions: Call light in reach

## 2021-02-06 NOTE — Progress Notes (Signed)
Reason for Admission:  Hemorrhagic shock,GIB,black tarry stools.light headedness,was on eliquis prior to admission for PE                     RUR Score:          14%           Plan for utilizing home health:      Home health through [facility] for SN/PT/OT    PCP: First and Last name:  Niece prefers for PCP to be set up near [employer] area     Name of Practice:    Are you a current patient: Yes/No: no   Approximate date of last visit:    Can you participate in a virtual visit with your PCP: with niece's elp                    Current Advanced Directive/Advance Care Plan: Full Code      Healthcare Decision Maker:            DVV:[id number] Smith(Lisa) @ [phone]                    Transition of Care Plan:                    I met with pt to discuss discharge needs.  Pt recently moved to [place] from [place] after being discharged from [facility] .When discharged,pt was referred to [facility] for skilled nursing,PT and OT.    Pt does not have a local PCP.Niece is requesting we set pt up with [facility] when discharged.I am requesting for the case management assistant to please make pt a new PCP appointment as pt needs a new PCP and needs home health.    Pt will be living permanently with her niece:  New address is [address]  [place] [facility]   [address]  [place] [address]    Orders for home health obtained and faxed through [employer] to [facility]    At home,pt has two canes and a rolling walker.She does not have home oxygen.    Past medical history includes:Graves,HF,pulmonary HTN,gout,DMT2    Pt is on 5 liters oxygen by nc.  Pt remains on a neo-synephrine gtt  The plan is for pt to have upper endoscopy today.    Information on [facility] placed on AVS as a resource also.    Marliss Czar  [phone]

## 2021-02-06 NOTE — Procedures (Signed)
Procedures by Rhodia Albright, MD at 02/06/21 1446                Author: Rhodia Albright, MD  Service: Gastroenterology  Author Type: Physician       Filed: 02/06/21 1451  Date of Service: 02/06/21 1446  Status: Signed          Editor: Rhodia Albright, MD (Physician)            Pre-procedure Diagnoses        1. Melena [K92.1]        2. Acute blood loss anemia [D62]                           Post-procedure Diagnoses        1. Multiple duodenal ulcers [K26.9]                           Procedures        1. UPPER GI ENDOSCOPY,DIAGNOSIS [[id number]]                              [place] [facility]   Deno Lunger, M.D.   [phone]               02/06/2021                  EGD Operative Report   Aivah Putman   DOB:  03-12-1946   Donna Christen Medical Record Number:  [id number]         Indication:  Melena/hematochezia, acute blood loss anemia       Operator: Rhodia Albright, MD      Referring Provider:  None         Anesthesia/Sedation:  MAC anesthesia      Airway assessment: No airway problems anticipated      Pre-Procedural Exam:        Airway: clear, no airway problems anticipated   Heart: RRR, without gallops or rubs   Lungs: clear bilaterally without wheezes, crackles, or rhonchi   Abdomen: soft, nontender, nondistended, bowel sounds present   Mental Status: awake, alert and oriented to person, place and time         Procedure Details       After infomed consent was obtained for the procedure, with all risks and benefits of procedure explained the patient was taken to the endoscopy suite and placed in the left lateral decubitus position.  Following sequential administration of sedation as  per above, the endoscope was inserted into the mouth and advanced under direct vision to second portion of the duodenum.  A careful inspection was made as the gastroscope was withdrawn, including a retroflexed view of the proximal stomach; findings and  interventions are described below.        Findings:    Esophagus:normal    Stomach: normal    Duodenum/jejunum: Evidence of 4 ulcers seen in the distal bulb and junction with second portion. Largest crater was about 1 cm and smallest  about 4 mm. All had white yellowish crater without any visible vessel or stigmata of recent bleeding.      Therapies:  none      Specimens: none             Complications:   None; patient tolerated the procedure well.  EBL:  None.             Impression:    Four superficial duodenal ulcers      Recommendations:      -Continue acid suppression.   -Will check serology for H.pylori   -If no recent colonoscopy done, she will need a colonoscopy done prior to discharge      Rut Betterton Corbin Ade, MD

## 2021-02-06 NOTE — Progress Notes (Signed)
Registration notified about address change.  [facility] has accepted pt.  Pt has new PCP appointment on AVS.    Marliss Czar

## 2021-02-06 NOTE — Progress Notes (Signed)
Jonnell Kucinski  05/06/1946  [id number]    Situation:  Verbal report received from: [facility] RN   Procedure: Procedure(s):  ESOPHAGOGASTRODUODENOSCOPY (EGD)    Background:    Preoperative diagnosis: melena  Postoperative diagnosis: * No post-op diagnosis entered *    Operator:  Dr. Monika Salk  Assistant(s): Endoscopy Technician-1: Ayesha Rumpf  Endoscopy RN-1: Kendrick Ranch, RN    Specimens: * No specimens in log *  H. Pylori  no    Assessment:    Anesthesia gave intra-procedure sedation and medications, see anesthesia flow sheet no    Intravenous fluids: NS@ KVO     Vital signs stable     Abdominal assessment: round and soft     Recommendation:  Discharge patient per MD order.  Return to floor  Family or Friend   Permission to share finding with family or friend yes and n/a

## 2021-02-06 NOTE — Anesthesia Pre-Procedure Evaluation (Signed)
Relevant Problems   HEMATOLOGY   (+) Acute blood loss anemia       Anesthetic History   No history of anesthetic complications            Review of Systems / Medical History  Patient summary reviewed and pertinent labs reviewed    Pulmonary    COPD      Smoker      Comments: H/o mild pulm hypertension  COPD    Neuro/Psych   Within defined limits           Cardiovascular  Within defined limits                  Comments: Admitted with hypotension; now titrating phenylephrine drip down- 78ION/GEX; bp 528U systolic    Recent DVT/PE was on Eliquis   GI/Hepatic/Renal           PUD    Comments: Admitted with hemorrhagic shock; Hgb 4.8  transfused 4 units; now Hgb 8 Endo/Other    Diabetes  Hypothyroidism       Other Findings   Comments: Electrolyte abnormalities improved         Physical Exam    Airway  Mallampati: II    Neck ROM: normal range of motion   Mouth opening: Normal     Cardiovascular    Rhythm: regular  Rate: normal         Dental    Dentition: Edentulous     Pulmonary  Breath sounds clear to auscultation               Abdominal  GI exam deferred       Other Findings            Anesthetic Plan    ASA: 3  Anesthesia type: MAC          Induction: Intravenous  Anesthetic plan and risks discussed with: Patient

## 2021-02-06 NOTE — Progress Notes (Signed)
Verbal shift change report given to Will (oncoming nurse) by Nira Conn (offgoing nurse). Report included the following information SBAR, Kardex, Recent Results and Cardiac Rhythm Sinus.     0800 - Repositioned    0900 - Assessment complete, PRN meds, COVID swab, rounded with Intensivist    1000 - Pt repositioned, pain reassessed, improved    1100 - IDRs, awaiting EGD, weaning Neo    1200 - Assessment complete, pt repositioned    1300 - EGD soon, ordered new NEO drip to go with pt for procedure    1400 - transported via stretcher to pre-op, on monitor, w/RN, handoff to preop RN    1600 - Assessment complete, family @ bedside    1800 - travelled to [place] with RN, pt on monitor    1900 - Verbal shift change report given to Nasir (Cabin crew) by Will Physiological scientist). Report included the following information SBAR, Kardex, Intake/Output, MAR, Recent Results and Cardiac Rhythm Sinus.

## 2021-02-06 NOTE — Progress Notes (Signed)
Progress  Notes by Beryle Lathe, MD at 02/06/21 [phone]                Author: Beryle Lathe, MD  Service: Internal Medicine  Author Type: Physician       Filed: 02/06/21 1001  Date of Service: 02/06/21 0956  Status: Signed          Editor: Beryle Lathe, MD (Physician)                        [facility]  [facility]ITICAL [facility]      Tele ICU TEAM Progress Note         Name:  Savannah Irwin        DOB:  [birth year]     MRN:  [id number]        Date:  02/06/2021                 ICU Assessment        1.  GIB, melena   2.  Hypotension   3.  Acute blood loss anemia   4.  Pulmonary embolism on AC   5.  AKI                  ICU Comprehensive Plan of Care:      NEURO: intact   CV: Keep MAP >65; pressors as needed   PULM: Continue supplemental o2 as needed, fu US venous - if positive may need consideration for IVC filter if unable to take [facility]   GI: npo, on iv ppi bid, gi on board, plan for egd today, serial h/h   RENAL/GU: Monitor Cr and UOP;  Dose albumin   ENDO: BG Goal 140-180; glycemic control   HEME/ONC: Tx Hgb < 7, Plt<10k; transfuse as needed   MICRO:  FU on Cx; cont broad abx coverage   Code Status: Full Code   LOS: 1   Prophylaxis: GI: PPI   DVT:  On hold due to GIB       Discussed Care Plan with Bedside RN            Subjective:     Progress Note: 02/06/2021        Reason for ICU Admission: GIB      S: denies CP or SOB, ongoing abd pain. No clinical evidence of further GIB. hgb stable however on pressors overnight after pain meds administered           Active Problem List:           Problem List   Never Reviewed                       Codes  Class            Hemorrhagic shock (HCC)  ICD-10-CM: R57.8   ICD-9-CM: 785.59                        Acute blood loss anemia  ICD-10-CM: D62   ICD-9-CM: 285.1                              Past Medical History:         has a past medical history of Acquired hypothyroidism, Emphysema lung (HCC), Fibromyalgia, Gout, Graves' disease, Heart failure with preserved ejection fraction (HCC),  Pulmonary hypertension (HCC),  and Type 2 diabetes mellitus (HCC).  Past Surgical History:         has a past surgical history that includes hx hysterectomy and hx cholecystectomy (1972).        Home Medications:          Prior to Admission medications             Medication  Sig  Start Date  End Date  Taking?  Authorizing Provider            apixaban (ELIQUIS) 5 mg tablet  Take 5 mg by mouth two (2) times a day.      Yes  Provider, Historical     levothyroxine (SYNTHROID) 50 mcg tablet  Take 50 mcg by mouth Daily (before breakfast).        Provider, Historical     pantoprazole (PROTONIX) 40 mg tablet  Take 40 mg by mouth daily.        Provider, Historical     sertraline (ZOLOFT) 50 mg tablet  Take 50 mg by mouth daily.        Provider, Historical     sodium chloride 1 gram tablet  Take 1 g by mouth daily. Take one tablet by mouth daily for 7 days        Provider, Historical     ergocalciferol (Vitamin D2) 1,250 mcg (50,000 unit) capsule  Take 50,000 Units by mouth every seven (7) days.        Provider, Historical     atorvastatin (LIPITOR) 10 mg tablet  Take 10 mg by mouth daily.        Provider, Historical     azelastine-fluticasone 137-50 mcg/spray spry  2 Sprays by Nasal route two (2) times a day.        Provider, Historical     calcium carbonate (OS-CAL) 500 mg calcium (1,250 mg) tablet  Take 1,250 Tablets by mouth three (3) times daily.        Provider, Historical     clotrimazole (Lotrimin AF, clotrimazole,) 1 % topical cream  Apply  to affected area two (2) times a day.        Provider, Historical     ipratropium (ATROVENT HFA) 17 mcg/actuation inhaler  Take 2 Puffs by inhalation every six (6) hours as needed for Wheezing.        Provider, Historical            alendronate (FOSAMAX) 70 mg tablet  Take 70 mg by mouth every seven (7) days.        Provider, Historical            allopurinoL (ZYLOPRIM) 100 mg tablet  Take 100 mg by mouth daily.        Provider, Historical     amitriptyline (ELAVIL) 50 mg  tablet  Take 50 mg by mouth nightly.        Provider, Historical            atenoloL (TENORMIN) 25 mg tablet  Take 25 mg by mouth daily.        Provider, Historical             Allergies/Social/Family History:          Allergies        Allergen  Reactions         ?  Penicillins  Itching           Social History          Tobacco Use         ?  Smoking status:  Current Every Day Smoker              Packs/day:  0.50         ?  Smokeless tobacco:  Never Used        ?  Tobacco comment: Patient stated that she started smoking at the age of 56.       Substance Use Topics         ?  Alcohol use:  Not Currently             Comment: Patient denies any history of excessive alcohol intake.           Family History         Problem  Relation  Age of Onset          ?  OSTEOARTHRITIS  Mother       ?  Diabetes  Mother       ?  Hypertension  Mother       ?  Elevated Lipids  Sister       ?  Hypertension  Sister       ?  Thyroid Disease  Sister                Luiz Blare' disease             Review of Systems:         10 pt ros is neg except as noted in S         Objective:     Vital Signs:   Visit Vitals      BP  126/70     Pulse  82     Temp  98 ??F (36.7 ??C)     Resp  19     Ht  5\' 3"  (1.6 m)     Wt  48.5 kg (106 lb 14.8 oz)     SpO2  97%        BMI  18.94 kg/m??      O2 Flow Rate (L/min): 5 l/min  O2 Device: Nasal cannula Temp (24hrs), Avg:98.3 ??F (36.8 ??C), Min:98 ??F (36.7 ??C), Max:98.8 ??F (37.1 ??C)               Intake/Output:       Intake/Output Summary (Last 24 hours) at 02/06/2021 0957   Last data filed at 02/06/2021 02/08/2021     Gross per 24 hour        Intake  1888.08 ml        Output  1590 ml        Net  298.08 ml           Physical Exam:    Gen: letahrgic, ill appearing   HEENT: PERRL  NC, AT   Neck: no JVD, midline trach   Mouth: no oral bleeding   CV: NSR   Pulm: symmetric chest rise  Abd: NT ND soft   Ext: no edema, cyanosis, clubbing   MSK: no deformities, no synovitis   Neuro: lethargic, weak      LABS AND  DATA: Personally  reviewed     Recent Labs              02/06/21   0345  02/05/21   2002  02/05/21   0953  02/05/21   0402     WBC  11.9*   --    --   13.2*     HGB  8.0*  7.5*    < >  7.6*     HCT  24.5*  22.5*    < >  23.5*     PLT  228   --    --   263        < > = values in this interval not displayed.          Recent Labs              02/06/21   0345  02/05/21   0402  02/04/21   2222  02/04/21   2222     NA  138  138    < >  136     K  4.2  4.5    < >  5.2*     CL  111*  112*    < >  109*     CO2  22  20*    < >  17*     BUN  36*  66*    < >  82*     CREA  0.91  1.07*    < >  1.34*     GLU  94  196*    < >  286*     CA  8.0*  7.6*    < >  7.9*     MG   --    --    --   1.6     PHOS   --    --    --   3.2        < > = values in this interval not displayed.          Recent Labs             02/05/21   0402  02/04/21   2222  02/04/21   2222     AP  45   --   48     TP  5.7*   --   5.9*     ALB  2.2*   --   2.3*     GLOB  3.5   --   3.6     LPSE  25*    < >  23*        < > = values in this interval not displayed.          Recent Labs            02/06/21   0345  02/04/21   2222     INR  1.1  1.2*         PTP  11.6*  12.8*         No results for input(s): PHI, PCO2I, PO2I, FIO2I in the last 72 hours.   No results for input(s): CPK, CKMB, TROIQ, BNPP in the last 72 hours.      Hemodynamics:         PAP:     CO:        Wedge:     CI:        CVP:      SVR:                 PVR:           Ventilator Settings:          Mode  Rate  Tidal Volume  Pressure  FiO2  PEEP  Peak airway pressure:            Minute ventilation:               MEDS: Reviewed          I performed all aspects of the physical examination via Telemedicine associated with two way audio and video communication and with the on-site assistance of Nurse.  I am located in [place] and the patient is located in [place] [facility]. Patient is critically ill in the ICU.   I  personally  reviewed the pertinent medical  records, laboratory/ pathology data and radiographic images.  The decision making regarding this patient is as documented above, which was generated  following   discussion  with the multidisciplinary team and creation of a treatment plan for  the patient. We discussed the patient's interval history and future coordination of care and  plans.  The patient's medications  were reviewed and changes made as stipulated  above.  Due to  critical illness impairing one or more vital organs of this patient resulting in life threatening clinical situation  I have provided direct, frequent personal  assessment and manipulation in management plan and spent 30  minutes  of   critical care time excluding the time spent on procedures and teaching.  Greater than 50% of this time  in patient's care was  employed  in counseling and coordination of care and engaged in face to face discussion of case management issues, addressing  questions, and outlining a plan of  therapy.      Beryle Lathe, MD   [facility]   02/06/2021

## 2021-02-06 NOTE — Anesthesia Post-Procedure Evaluation (Signed)
Procedure(s):  ESOPHAGOGASTRODUODENOSCOPY (EGD).    MAC    Anesthesia Post Evaluation      Multimodal analgesia: multimodal analgesia not used between 6 hours prior to anesthesia start to PACU discharge  Patient location during evaluation: PACU  Patient participation: complete - patient participated  Level of consciousness: awake  Pain management: adequate  Airway patency: patent  Anesthetic complications: no  Cardiovascular status: acceptable, blood pressure returned to baseline and hemodynamically stable  Respiratory status: acceptable  Hydration status: acceptable  Post anesthesia nausea and vomiting:  controlled  Final Post Anesthesia Temperature Assessment:  Normothermia (36.0-37.5 degrees C)      INITIAL Post-op Vital signs:   Vitals Value Taken Time   BP 96/58 02/06/21 1630   Temp 36.6 ??C (97.9 ??F) 02/06/21 1551   Pulse 89 02/06/21 1638   Resp 21 02/06/21 1638   SpO2 92 % 02/06/21 1638   Vitals shown include unvalidated device data.

## 2021-02-06 NOTE — Progress Notes (Signed)
Hospital Follow Up New Patient Appointment with PCP Duanne Limerick, MD for 02/08/2021 at 8:00am

## 2021-02-07 ENCOUNTER — Inpatient Hospital Stay: Admit: 2021-02-07 | Payer: MEDICARE

## 2021-02-07 LAB — ECHO ADULT COMPLETE
AV Area by Peak Velocity: 2.1 cm2
AV Area by Peak Velocity: 2.1 cm2
AV Peak Gradient: 8 mmHg
AV Peak Velocity: 1.4 m/s
AV Velocity Ratio: 0.64
Ascending Aorta Index: 2.2 cm/m2
Ascending Aorta: 3.3 cm
E/E' Lateral: 9.89
E/E' Ratio (Averaged): 11.3
E/E' Septal: 12.71
Est. RA Pressure: 3 mmHg
Fractional Shortening 2D: 31 % (ref 28–44)
IVSd: 0.5 cm — AB (ref 0.6–0.9)
LA Diameter: 2.9 cm
LA Size Index: 1.93 cm/m2
LA Volume 2C: 27 mL (ref 22–52)
LA Volume 4C: 20 mL — AB (ref 22–52)
LA Volume A/L: 28 mL
LA Volume BP: 24 mL (ref 22–52)
LA Volume BP: 24 mL (ref 22–52)
LA Volume Index 2C: 18 mL/m2 (ref 16–34)
LA Volume Index 4C: 13 mL/m2 — AB (ref 16–34)
LA Volume Index A/L: 19 mL/m2 (ref 16–34)
LV E' Lateral Velocity: 9 cm/s
LV E' Septal Velocity: 7 cm/s
LV Mass 2D Index: 28.5 g/m2 — AB (ref 43–95)
LV Mass 2D: 42.8 g — AB (ref 67–162)
LV RWT Ratio: 0.28
LVIDd Index: 2.4 cm/m2
LVIDd: 3.6 cm — AB (ref 3.9–5.3)
LVIDs Index: 1.67 cm/m2
LVIDs: 2.5 cm
LVOT Area: 3.1 cm2
LVOT Diameter: 2 cm
LVOT Peak Gradient: 3 mmHg
LVOT Peak Velocity: 0.9 m/s
LVPWd: 0.5 cm — AB (ref 0.6–0.9)
MV A Velocity: 1.03 m/s
MV E Velocity: 0.89 m/s
MV E Wave Deceleration Time: 153 ms
MV E/A: 0.86
PV Max Velocity: 1.2 m/s
PV Peak Gradient: 6 mmHg
RV Free Wall Peak S': 13 cm/s
RVIDd: 3.5 cm
RVSP: 61 mmHg
TAPSE: 1.4 cm — AB (ref 1.5–2.0)
TR Max Velocity: 3.82 m/s
TR Peak Gradient: 58 mmHg

## 2021-02-07 LAB — METABOLIC PANEL, COMPREHENSIVE
A-G Ratio: 0.9 — ABNORMAL LOW (ref 1.1–2.2)
ALT (SGPT): 13 U/L (ref 12–78)
AST (SGOT): 9 U/L — ABNORMAL LOW (ref 15–37)
Albumin: 2.6 g/dL — ABNORMAL LOW (ref 3.5–5.0)
Alk. phosphatase: 42 U/L — ABNORMAL LOW (ref 45–117)
Anion gap: 5 mmol/L (ref 5–15)
BUN/Creatinine ratio: 22 — ABNORMAL HIGH (ref 12–20)
BUN: 20 MG/DL (ref 6–20)
Bilirubin, total: 0.3 MG/DL (ref 0.2–1.0)
CO2: 23 mmol/L (ref 21–32)
Calcium: 8 MG/DL — ABNORMAL LOW (ref 8.5–10.1)
Chloride: 109 mmol/L — ABNORMAL HIGH (ref 97–108)
Creatinine: 0.91 MG/DL (ref 0.55–1.02)
GFR est AA: >60 mL/min/{1.73_m2} (ref >60)
GFR est non-AA: >60 mL/min/{1.73_m2} (ref >60)
Globulin: 2.8 g/dL (ref 2.0–4.0)
Glucose: 193 mg/dL — ABNORMAL HIGH (ref 65–100)
Potassium: 4 mmol/L (ref 3.5–5.1)
Protein, total: 5.4 g/dL — ABNORMAL LOW (ref 6.4–8.2)
Sodium: 137 mmol/L (ref 136–145)

## 2021-02-07 LAB — CBC W/O DIFF
ABSOLUTE NRBC: 0.1 10*3/uL — ABNORMAL HIGH (ref 0.00–0.01)
HCT: 23.7 % — ABNORMAL LOW (ref 35.0–47.0)
HGB: 7.7 g/dL — ABNORMAL LOW (ref 11.5–16.0)
MCH: 30.4 PG (ref 26.0–34.0)
MCHC: 32.5 g/dL (ref 30.0–36.5)
MCV: 93.7 FL (ref 80.0–99.0)
MPV: 8.6 FL — ABNORMAL LOW (ref 8.9–12.9)
NRBC: 1.2 PER 100 WBC — ABNORMAL HIGH
PLATELET: 206 10*3/uL (ref 150–400)
RBC: 2.53 M/uL — ABNORMAL LOW (ref 3.80–5.20)
RDW: 18.8 % — ABNORMAL HIGH (ref 11.5–14.5)
WBC: 8.1 10*3/uL (ref 3.6–11.0)

## 2021-02-07 LAB — IRON PROFILE
Iron % saturation: 20 % (ref 20–50)
Iron: 27 ug/dL — ABNORMAL LOW (ref 35–150)
TIBC: 138 ug/dL — ABNORMAL LOW (ref 250–450)

## 2021-02-07 LAB — RETICULOCYTE COUNT
Absolute Retic Cnt.: 0.136 M/ul — ABNORMAL HIGH (ref 0.0164–0.0776)
Absolute Retic Cnt.: 0.136 M/ul — ABNORMAL HIGH (ref 0.0164–0.0776)
Retic Ct Pct: 5.8 % — ABNORMAL HIGH (ref 0.7–2.1)
Reticulocyte count: 5.8 % — ABNORMAL HIGH (ref 0.7–2.1)

## 2021-02-07 LAB — PROTHROMBIN TIME + INR
INR: 1.1 (ref 0.9–1.1)
Prothrombin time: 11.8 s — ABNORMAL HIGH (ref 9.0–11.1)

## 2021-02-07 LAB — HAPTOGLOBIN
HAPTOGLOBIN, HAPGB: 131 mg/dL (ref 30–200)
Haptoglobin: 131 mg/dL (ref 30–200)

## 2021-02-07 LAB — LD: LD: 159 U/L (ref 81–246)

## 2021-02-07 LAB — TSH 3RD GENERATION
TSH: 5.04 u[IU]/mL — ABNORMAL HIGH (ref 0.36–3.74)
TSH: 5.04 u[IU]/mL — ABNORMAL HIGH (ref 0.36–3.74)

## 2021-02-07 LAB — TRANSTHORACIC ECHOCARDIOGRAM (TTE) COMPLETE (CONTRAST/BUBBLE/3D PRN)
AV Area by Peak Velocity: 2.1 cm2
AV Area by Peak Velocity: 2.1 cm2
AV Peak Gradient: 8 mmHg
AV Peak Velocity: 1.4 m/s
AV Velocity Ratio: 0.64
Ascending Aorta Index: 2.2 cm/m2
Ascending Aorta: 3.3 cm
E/E' Lateral: 9.89
E/E' Ratio (Averaged): 11.3
E/E' Septal: 12.71
Est. RA Pressure: 3 mmHg
Fractional Shortening 2D: 31 % (ref 28–44)
IVSd: 0.5 cm — AB (ref 0.6–0.9)
LA Diameter: 2.9 cm
LA Size Index: 1.93 cm/m2
LA Volume 2C: 27 mL (ref 22–52)
LA Volume 4C: 20 mL — AB (ref 22–52)
LA Volume A/L: 28 mL
LA Volume BP: 24 mL (ref 22–52)
LA Volume BP: 24 mL (ref 22–52)
LA Volume Index 2C: 18 mL/m2 (ref 16–34)
LA Volume Index 4C: 13 mL/m2 — AB (ref 16–34)
LA Volume Index A/L: 19 mL/m2 (ref 16–34)
LV E' Lateral Velocity: 9 cm/s
LV E' Septal Velocity: 7 cm/s
LV Mass 2D Index: 28.5 g/m2 — AB (ref 43–95)
LV Mass 2D: 42.8 g — AB (ref 67–162)
LV RWT Ratio: 0.28
LVIDd Index: 2.4 cm/m2
LVIDd: 3.6 cm — AB (ref 3.9–5.3)
LVIDs Index: 1.67 cm/m2
LVIDs: 2.5 cm
LVOT Area: 3.1 cm2
LVOT Diameter: 2 cm
LVOT Peak Gradient: 3 mmHg
LVOT Peak Velocity: 0.9 m/s
LVPWd: 0.5 cm — AB (ref 0.6–0.9)
Left Ventricular Ejection Fraction: 58
MV A Velocity: 1.03 m/s
MV E Velocity: 0.89 m/s
MV E Wave Deceleration Time: 153 ms
MV E/A: 0.86
PV Max Velocity: 1.2 m/s
PV Peak Gradient: 6 mmHg
RV Free Wall Peak S': 13 cm/s
RVIDd: 3.5 cm
RVSP: 61 mmHg
TAPSE: 1.4 cm — AB (ref 1.5–2)
TR Max Velocity: 3.82 m/s
TR Peak Gradient: 58 mmHg

## 2021-02-07 LAB — COMPREHENSIVE METABOLIC PANEL
ALT: 13 U/L (ref 12–78)
AST: 9 U/L — ABNORMAL LOW (ref 15–37)
Albumin/Globulin Ratio: 0.9 — ABNORMAL LOW (ref 1.1–2.2)
Albumin: 2.6 g/dL — ABNORMAL LOW (ref 3.5–5.0)
Alkaline Phosphatase: 42 U/L — ABNORMAL LOW (ref 45–117)
Anion Gap: 5 mmol/L (ref 5–15)
BUN: 20 MG/DL (ref 6–20)
Bun/Cre Ratio: 22 — ABNORMAL HIGH (ref 12–20)
CO2: 23 mmol/L (ref 21–32)
Calcium: 8 MG/DL — ABNORMAL LOW (ref 8.5–10.1)
Chloride: 109 mmol/L — ABNORMAL HIGH (ref 97–108)
Creatinine: 0.91 MG/DL (ref 0.55–1.02)
EGFR IF NonAfrican American: >60 mL/min/{1.73_m2} (ref >60)
GFR African American: >60 mL/min/{1.73_m2} (ref >60)
Globulin: 2.8 g/dL (ref 2.0–4.0)
Glucose: 193 mg/dL — ABNORMAL HIGH (ref 65–100)
Potassium: 4 mmol/L (ref 3.5–5.1)
Sodium: 137 mmol/L (ref 136–145)
Total Bilirubin: 0.3 MG/DL (ref 0.2–1.0)
Total Protein: 5.4 g/dL — ABNORMAL LOW (ref 6.4–8.2)

## 2021-02-07 LAB — IRON AND TIBC
Iron Saturation: 20 % (ref 20–50)
Iron: 27 ug/dL — ABNORMAL LOW (ref 35–150)
TIBC: 138 ug/dL — ABNORMAL LOW (ref 250–450)

## 2021-02-07 LAB — CBC
Hematocrit: 23.7 % — ABNORMAL LOW (ref 35.0–47.0)
Hemoglobin: 7.7 g/dL — ABNORMAL LOW (ref 11.5–16.0)
MCH: 30.4 PG (ref 26.0–34.0)
MCHC: 32.5 g/dL (ref 30.0–36.5)
MCV: 93.7 FL (ref 80.0–99.0)
MPV: 8.6 FL — ABNORMAL LOW (ref 8.9–12.9)
NRBC Absolute: 0.1 10*3/uL — ABNORMAL HIGH (ref 0.00–0.01)
Nucleated RBCs: 1.2 PER 100 WBC — ABNORMAL HIGH
Platelets: 206 10*3/uL (ref 150–400)
RBC: 2.53 M/uL — ABNORMAL LOW (ref 3.80–5.20)
RDW: 18.8 % — ABNORMAL HIGH (ref 11.5–14.5)
WBC: 8.1 10*3/uL (ref 3.6–11.0)

## 2021-02-07 LAB — LACTATE DEHYDROGENASE: LD: 159 U/L (ref 81–246)

## 2021-02-07 LAB — PROTIME-INR
INR: 1.1 (ref 0.9–1.1)
Protime: 11.8 s — ABNORMAL HIGH (ref 9.0–11.1)

## 2021-02-07 MED ORDER — MIDODRINE 5 MG TAB
5 mg | Freq: Three times a day (TID) | ORAL | Status: DC
Start: 2021-02-07 — End: 2021-02-10
  Administered 2021-02-07 – 2021-02-10 (×10): via ORAL

## 2021-02-07 MED ORDER — SODIUM CHLORIDE 0.9 % IV
10 mg/mL | INTRAVENOUS | Status: DC
Start: 2021-02-07 — End: 2021-02-08

## 2021-02-07 MED ORDER — ATORVASTATIN 10 MG TAB
10 mg | Freq: Every day | ORAL | Status: DC
Start: 2021-02-07 — End: 2021-02-10
  Administered 2021-02-08 – 2021-02-10 (×3): via ORAL

## 2021-02-07 MED ORDER — AMITRIPTYLINE 50 MG TAB
50 mg | Freq: Every evening | ORAL | Status: DC
Start: 2021-02-07 — End: 2021-02-10
  Administered 2021-02-08 – 2021-02-10 (×3): via ORAL

## 2021-02-07 MED ORDER — SERTRALINE 50 MG TAB
50 mg | Freq: Every day | ORAL | Status: DC
Start: 2021-02-07 — End: 2021-02-10
  Administered 2021-02-08 – 2021-02-10 (×3): via ORAL

## 2021-02-07 MED ORDER — ATENOLOL 25 MG TAB
25 mg | Freq: Every day | ORAL | Status: DC
Start: 2021-02-07 — End: 2021-02-10
  Administered 2021-02-08 – 2021-02-10 (×2): via ORAL

## 2021-02-07 MED ORDER — PANTOPRAZOLE 40 MG TAB, DELAYED RELEASE
40 mg | Freq: Two times a day (BID) | ORAL | Status: DC
Start: 2021-02-07 — End: 2021-02-10
  Administered 2021-02-07 – 2021-02-10 (×6): via ORAL

## 2021-02-07 MED ORDER — ALBUMIN, HUMAN 25 % IV
25 % | Freq: Once | INTRAVENOUS | Status: AC
Start: 2021-02-07 — End: 2021-02-07
  Administered 2021-02-07: 15:00:00 via INTRAVENOUS

## 2021-02-07 MED ORDER — ALLOPURINOL 100 MG TAB
100 mg | Freq: Every day | ORAL | Status: DC
Start: 2021-02-07 — End: 2021-02-10
  Administered 2021-02-08 – 2021-02-10 (×3): via ORAL

## 2021-02-07 MED ORDER — SODIUM CHLORIDE 0.9% BOLUS IV
0.9 % | Freq: Once | INTRAVENOUS | Status: AC
Start: 2021-02-07 — End: 2021-02-07
  Administered 2021-02-07: 22:00:00 via INTRAVENOUS

## 2021-02-07 MED ORDER — FUROSEMIDE 40 MG TAB
40 mg | Freq: Every day | ORAL | Status: DC
Start: 2021-02-07 — End: 2021-02-07
  Administered 2021-02-07: 15:00:00 via ORAL

## 2021-02-07 MED FILL — FLEXBUMIN 25 % INTRAVENOUS SOLUTION: 25 % | INTRAVENOUS | Qty: 100

## 2021-02-07 MED FILL — LIDOCAINE 4 % TOPICAL PATCH (12 HOUR DURATION): 4 % | CUTANEOUS | Qty: 1

## 2021-02-07 MED FILL — MIDODRINE 5 MG TAB: 5 mg | ORAL | Qty: 1

## 2021-02-07 MED FILL — METRONIDAZOLE IN SODIUM CHLORIDE (ISO-OSM) 500 MG/100 ML IV PIGGY BACK: 500 mg/100 mL | INTRAVENOUS | Qty: 100

## 2021-02-07 MED FILL — SODIUM CHLORIDE 0.9 % IV: INTRAVENOUS | Qty: 500

## 2021-02-07 MED FILL — PANTOPRAZOLE 40 MG TAB, DELAYED RELEASE: 40 mg | ORAL | Qty: 1

## 2021-02-07 MED FILL — ACETAMINOPHEN 325 MG TABLET: 325 mg | ORAL | Qty: 2

## 2021-02-07 MED FILL — PROTONIX 40 MG INTRAVENOUS SOLUTION: 40 mg | INTRAVENOUS | Qty: 40

## 2021-02-07 MED FILL — PHENYLEPHRINE 10 MG/ML INJECTION: 10 mg/mL | INTRAMUSCULAR | Qty: 3

## 2021-02-07 MED FILL — LEVOFLOXACIN IN D5W 750 MG/150 ML IV PIGGY BACK: 750 mg/150 mL | INTRAVENOUS | Qty: 150

## 2021-02-07 MED FILL — FUROSEMIDE 40 MG TAB: 40 mg | ORAL | Qty: 1

## 2021-02-07 NOTE — Progress Notes (Signed)
[facility]    Medical Progress Note      NAME: Savannah Irwin   DOB:  29-Oct-1946  MRM:  [id number]    Date/Time of service 02/07/2021  2:01 PM          Assessment and Plan:     Acute blood loss anemia - POA, now stable.     Upper GI bleed - POA.  Appreciate GI consult.  GI found non bleeding duodenal ulcers.  PPI BID.  Stop bisphosphanate.     Hemorrhagic shock / Sinus tachycardia - POA, better today.  Continue midodrine, resume low dose atenolol to avoid rebound tachycardia.  Stop lasix.     Type 2 diabetes mellitus - Diabetic diet and counseling.  SSI per protocol.  Not on home meds. A1c useless in setting of bleed.    Pulmonary hypertension / Heart failure with preserved ejection fraction - Appears stable. Checking ECHO.  Hold lasix.    Graves' disease / hypothyroidism - POA, stable, stop homeopathic dose of synthroid    Gout - continue allopurinol.    Emphysema lung - Outpatient pulmonary follow up.    Hyperlipidemia - continue atorvastatin    Anxiety and depression / Fibromyalgia - continue sertralilne and amitriptyline    Underweight / Hypoalbuminemia - POA, chronic, would benefit from supplements.    Gastroenteritis / Leukocytosis - Procalcitonin Negative. Cx negative.  Likely viral vs food.  Stop Abx.         Subjective:     Chief Complaint:  Feels better but still week    ROS:  (bold if positive, if negative)    Tolerating some PT  Tolerating Diet        Objective:     Last 24hrs VS reviewed since prior progress note. Most recent are:    Visit Vitals  BP (!) 98/51   Pulse (!) 102   Temp 98.2 ??F (36.8 ??C)   Resp 21   Ht 5\' 3"  (1.6 m)   Wt 49.6 kg (109 lb 5.6 oz)   SpO2 95%   BMI 19.37 kg/m??     SpO2 Readings from Last 6 Encounters:   02/07/21 95%    O2 Flow Rate (L/min): 2 l/min       Intake/Output Summary (Last 24 hours) at 02/07/2021 1401  Last data filed at 02/07/2021 1141  Gross per 24 hour   Intake 2862.25 ml   Output 1825 ml   Net 1037.25 ml        Physical Exam:    Gen:  Thin, in no acute  distress  HEENT:  Pink conjunctivae, PERRL, hearing intact to voice, moist mucous membranes  Neck:  Supple, without masses, thyroid non-tender  Resp:  No accessory muscle use, clear breath sounds without wheezes rales or rhonchi  Card:  No murmurs, tachycardic S1, S2 without thrills, bruits or peripheral edema  Abd:  Soft, non-tender, non-distended, normoactive bowel sounds are present, no mass  Lymph:  No cervical or inguinal adenopathy  Musc:  No cyanosis or clubbing  Skin:  No rashes or ulcers, skin turgor is good  Neuro:  Cranial nerves are grossly intact, general motor weakness, follows commands   Psych:  Poor insight, oriented to person, place and time, alert    Telemetry reviewed:   normal sinus rhythm  __________________________________________________________________  Medications Reviewed: (see below)  Medications:     Current Facility-Administered Medications   Medication Dose Route Frequency   ??? midodrine (PROAMATINE) tablet 5 mg  5 mg  Oral TID WITH MEALS   ??? pantoprazole (PROTONIX) tablet 40 mg  40 mg Oral ACB&D   ??? [START ON 02/08/2021] sertraline (ZOLOFT) tablet 50 mg  50 mg Oral DAILY   ??? [START ON 02/08/2021] atorvastatin (LIPITOR) tablet 10 mg  10 mg Oral DAILY   ??? [START ON 02/08/2021] atenoloL (TENORMIN) tablet 25 mg  25 mg Oral DAILY   ??? amitriptyline (ELAVIL) tablet 50 mg  50 mg Oral QHS   ??? [START ON 02/08/2021] allopurinoL (ZYLOPRIM) tablet 100 mg  100 mg Oral DAILY   ??? sodium chloride (NS) flush 5-40 mL  5-40 mL IntraVENous Q8H   ??? sodium chloride (NS) flush 5-40 mL  5-40 mL IntraVENous PRN   ??? acetaminophen (TYLENOL) tablet 650 mg  650 mg Oral Q6H PRN    Or   ??? acetaminophen (TYLENOL) suppository 650 mg  650 mg Rectal Q6H PRN   ??? polyethylene glycol (MIRALAX) packet 17 g  17 g Oral DAILY PRN   ??? ondansetron (ZOFRAN) injection 4 mg  4 mg IntraVENous Q4H PRN   ??? morphine injection 2 mg  2 mg IntraVENous Q3H PRN   ??? lidocaine 4 % patch 1 Patch  1 Patch TransDERmal Q24H   ??? 0.9% sodium chloride infusion  250 mL  250 mL IntraVENous PRN   ??? 0.9% sodium chloride infusion 250 mL  250 mL IntraVENous PRN        Lab Data Reviewed: (see below)  Lab Review:     Recent Labs     02/07/21  1136 02/06/21  0345 02/05/21  2002 02/05/21  0953 02/05/21  0402   WBC 8.1 11.9*  --   --  13.2*   HGB 7.7* 8.0* 7.5*   < > 7.6*   HCT 23.7* 24.5* 22.5*   < > 23.5*   PLT 206 228  --   --  263    < > = values in this interval not displayed.     Recent Labs     02/07/21  1136 02/06/21  0345 02/05/21  0402 02/04/21  2222 02/04/21  2222   NA 137 138 138   < > 136   K 4.0 4.2 4.5   < > 5.2*   CL 109* 111* 112*   < > 109*   CO2 23 22 20*   < > 17*   GLU 193* 94 196*   < > 286*   BUN 20 36* 66*   < > 82*   CREA 0.91 0.91 1.07*   < > 1.34*   CA 8.0* 8.0* 7.6*   < > 7.9*   MG  --   --   --   --  1.6   PHOS  --   --   --   --  3.2   ALB 2.6*  --  2.2*  --  2.3*   TBILI 0.3  --  0.4  --  0.2   ALT 13  --  20  --  22   INR 1.1 1.1  --   --  1.2*    < > = values in this interval not displayed.     No results found for: GLUCPOC  No results for input(s): PH, PCO2, PO2, HCO3, FIO2 in the last 72 hours.  Recent Labs     02/07/21  1136 02/06/21  0345 02/04/21  2222   INR 1.1 1.1 1.2*     All Micro Results     Procedure  Component Value Units Date/Time    CULTURE, BLOOD, PAIRED [[id number]] Collected: 02/04/21 2222    Order Status: Completed Specimen: Blood Updated: 02/07/21 0634     Special Requests: NO SPECIAL REQUESTS        Culture result: NO GROWTH 3 DAYS       COVID-19 RAPID TEST [[id number]] Collected: 02/06/21 0950    Order Status: Completed Specimen: Nasopharyngeal Updated: 02/06/21 1016     Specimen source Nasopharyngeal        COVID-19 rapid test Not detected        Comment: Rapid Abbott ID Now       Rapid NAAT:  The specimen is NEGATIVE for SARS-CoV-2, the novel coronavirus associated with COVID-19.       Negative results should be treated as presumptive and, if inconsistent with clinical signs and symptoms or necessary for patient management, should  be tested with an alternative molecular assay.  Negative results do not preclude SARS-CoV-2 infection and should not be used as the sole basis for patient management decisions.       This test has been authorized by the FDA under an Emergency Use Authorization (EUA) for use by authorized laboratories.   Fact sheet for Healthcare Providers: FlickSafe.gl  Fact sheet for Patients: [place].at       Methodology: Isothermal Nucleic Acid Amplification               Other pertinent lab: none    Total time spent with patient: 30 Minutes I personally reviewed chart, notes, data and current medications in the medical record.  I have personally examined and treated the patient at bedside during this period.                 Care Plan discussed with: Patient, Family, Care Manager, Nursing Staff and >50% of time spent in counseling and coordination of care    Discussed:  Care Plan and D/C Planning    Prophylaxis:  H2B/PPI    Disposition:  SNF/LTC and HH PT, OT, RN           ___________________________________________________    Attending Physician: Salvadore Dom, MD

## 2021-02-07 NOTE — Progress Notes (Signed)
Transition of care note:    RUR 15%    Reason for Admission:  Hemorrhagic shock,GIB,black tarry stools.light headedness,was on eliquis prior to admission for PE      When discharged:  Pt will be living permanently with her niece:  New address is [address]  [facility]   [address]  [place] [address]    DME @ home:  Two canes  Rolling walker    Today:  Echo results pending  Iv levaquin  Iv flagyl    Future discharge disposition:  [facility] accepted pt for SN/PT/OT.  New PCP appointment made for pt with Dr Elray Buba  I have requested for the case management assistant to please make a different new PCP appointment with Dr Elray Buba as pt is currently scheduled for tomorrow am @ 8:00 am.    Marliss Czar

## 2021-02-07 NOTE — Progress Notes (Signed)
Chaplain attended Interdisciplinary rounds (IDT) with medical/care team members where this patient's ongoing care was discussed.     Chaplain Hale Bogus, M.Div.  Savannah Irwin [phone])

## 2021-02-07 NOTE — Progress Notes (Signed)
Progress  Notes by Mariea Stable, NP at 02/07/21 1541                Author: Mariea Stable, NP  Service: Gastroenterology  Author Type: Nurse Practitioner       Filed: 02/07/21 1743  Date of Service: 02/07/21 1541  Status: Attested           Editor: Mariea Stable, NP (Nurse Practitioner)  Cosigner: Arman Filter, MD at 02/07/21 1846          Attestation signed by Arman Filter, MD at 02/07/21 1846          Patient seen and examined, agree with NP Erandi Lemma's note and plan.                                 [facility]   Mariea Stable NP   [phone]               GI PROGRESS NOTE            NAME: Savannah Irwin    DOB:  1946-09-09    MRN:  [id number]            Subjective:     Reports her abdomen is sore today.  Nusing reports she had passed a dark stool with clots this afternoon.            Objective:     NAD         VITALS:    Last 24hrs VS reviewed since prior progress note. Most recent are:   Visit Vitals      BP  (!) 97/51     Pulse  87     Temp  98.2 ??F (36.8 ??C)     Resp  21     Ht  5\' 3"  (1.6 m)     Wt  49.6 kg (109 lb 5.6 oz)     SpO2  93%        BMI  19.37 kg/m??           Intake/Output Summary (Last 24 hours) at 02/07/2021 1542   Last data filed at 02/07/2021 1141     Gross per 24 hour        Intake  2862.25 ml        Output  1425 ml        Net  1437.25 ml           PHYSICAL EXAM:   General: Alert, in no acute distress     HEENT: Anicteric sclerae.   Lungs:            CTA Bilaterally.    Heart:  Regular  rhythm,     Abdomen: Soft, Non distended, Non tender.  (+)Bowel sounds, no HSM   Extremities: No c/c/e   Neurologic:  CN 2-12 gi, Alert and oriented X 3.  No acute neurological distress    Psych:   Good insight. Not anxious nor agitated.      Lab Data Reviewed:      Recent Labs            02/07/21   1136  02/06/21   0345     WBC  8.1  11.9*     HGB  7.7*  8.0*     HCT  23.7*  24.5*         PLT  206  228          Recent Labs              02/07/21   1136  02/06/21   0345  02/05/21   0402   02/04/21   2222     NA  137  138    < >  136     K  4.0  4.2    < >  5.2*     CL  109*  111*    < >  109*     CO2  23  22    < >  17*     BUN  20  36*    < >  82*     CREA  0.91  0.91    < >  1.34*     GLU  193*  94    < >  286*     PHOS   --    --    --   3.2     CA  8.0*  8.0*    < >  7.9*        < > = values in this interval not displayed.          Recent Labs              02/07/21   1136  02/05/21   0402  02/04/21   2222  02/04/21   2222     AP  42*  45    < >  48     TP  5.4*  5.7*    < >  5.9*     ALB  2.6*  2.2*    < >  2.3*     GLOB  2.8  3.5    < >  3.6     LPSE   --   25*   --   23*        < > = values in this interval not displayed.           ________________________________________________________________________     Patient Active Problem List        Diagnosis  Code         ?  Hemorrhagic shock (HCC)  R57.8     ?  Acute blood loss anemia  D62     ?  Type 2 diabetes mellitus (HCC)  E11.9     ?  Pulmonary hypertension (HCC)  I27.20     ?  Heart failure with preserved ejection fraction (HCC)  I50.30     ?  Graves' disease  E05.00     ?  Gout  M10.9     ?  Acquired hypothyroidism  E03.9     ?  Emphysema lung (HCC)  J43.9     ?  Fibromyalgia  M79.7     ?  Anxiety and depression  F41.9, F32.A     ?  Underweight  R63.6     ?  Leukocytosis  D72.829     ?  Upper GI bleed  K92.2         ?  Hypoalbuminemia  E88.09              Assessment and Plan:   Melena:  Status post EGD yesterday which showed 4 superficial duodenal ulcers.  Her hemoglobin is stable today at 7.7.  Nursing reports she had a large black stool with clots this afternoon.       -  Continue to monitor hemoglobin   - Continue acid suppression   - GI bland diet   - Will arrange for colonoscopy on 3/3 before her discharge.      Following                   Signed By:  Mariea Stable, NP           02/07/2021  3:42 PM

## 2021-02-07 NOTE — Progress Notes (Signed)
Physical Therapy orders acknowledged, chart reviewed and discussed with nurse patient politely declined at this time. Noted continued low blood pressure at rest. We will continue to follow up with the patent for therapy thank  You.

## 2021-02-07 NOTE — Progress Notes (Signed)
 Comprehensive Nutrition Assessment    Type and Reason for Visit: Initial,RD nutrition re-screen/LOS    Nutrition Recommendations/Plan:   1. Continue regular, GI bland diet as tolerated  2. Provide Ensure High Protein BID (320 kcal, 38 g carbs, 32 g protein)      Nutrition Assessment:   Pt is a 75 year old female admitted with Hemorrhagic shock (HCC) [R57.8]  Acute blood loss anemia [D62]. She  has a past medical history of Acquired hypothyroidism, Emphysema lung (HCC), Fibromyalgia, Gout, Graves' disease, Heart failure with preserved ejection fraction (HCC), Pulmonary hypertension (HCC), and Type 2 diabetes mellitus (HCC).  Patient is unsure of UBW but states it may be close to 103-105#. Denies N/V/D. No chewing/swallowing problems. NKFA. Voiced acceptance of Ensure HP BID. Diet just started, patient had not yet consumed any of lunch tray at bedside during RD visit.     Patient Vitals for the past 168 hrs:   % Diet Eaten   02/07/21 0914 1 - 25%     Wt Readings from Last 10 Encounters:   02/07/21 49.6 kg (109 lb 5.6 oz)       Malnutrition Assessment:  Malnutrition Status:  Mild malnutrition    Context:  Acute illness     Findings of the 6 clinical characteristics of malnutrition:   Energy Intake:  Unable to assess  Weight Loss:  No significant weight loss     Body Fat Loss:  1 - Mild body fat loss, Buccal region   Muscle Mass Loss:  No significant muscle mass loss,    Fluid Accumulation:  No significant fluid accumulation,    Grip Strength:  Not performed     Estimated Daily Nutrient Needs:  Energy (kcal): 1250-1500 (25-30); Weight Used for Energy Requirements: Current  Protein (g): 50-60 (1.0-1.2); Weight Used for Protein Requirements: Current  Fluid (ml/day): 1250-1500; Method Used for Fluid Requirements:      Nutrition Related Findings:    ABD: Hyperactive bowel sounds with fair appetite  Last BM: 2/27  Edema: LLE: Non-pitting (02/07/2021  7:38 AM)  RLE: Non-pitting (02/07/2021  7:38 AM)    Nutr. Labs:  Lab Results    Component Value Date/Time    GFR est AA >60 02/06/2021 03:45 AM    GFR est non-AA >60 02/06/2021 03:45 AM    Creatinine 0.91 02/06/2021 03:45 AM    BUN 36 (H) 02/06/2021 03:45 AM    Sodium 138 02/06/2021 03:45 AM    Potassium 4.2 02/06/2021 03:45 AM    Chloride 111 (H) 02/06/2021 03:45 AM    CO2 22 02/06/2021 03:45 AM     Lab Results   Component Value Date/Time    Glucose 94 02/06/2021 03:45 AM     Lab Results   Component Value Date/Time    Hemoglobin A1c 6.3 (H) 02/05/2021 09:53 AM     Nutr. Meds:  Lasix, levaquin, flagyl, midodrine, zofran PRN, protonix    Wounds:    None       Current Nutrition Therapies:  ADULT DIET Regular; GI Bland (GERD/Peptic Ulcer)    Anthropometric Measures:   Height:  5' 3 (160 cm)   Current Body Wt:  49.6 kg (109 lb 5.6 oz)    Admission Body Wt:  109 lb 5.6 oz     Usual Body Wt:         Ideal Body Wt:  115 lbs:  95.1 %   Body mass index is 19.37 kg/m.   BMI Category:  Underweight (BMI less than  42) age over 51       Nutrition Diagnosis:    Mild malnutrition related to acute injury/trauma,inadequate protein-energy intake as evidenced by intake 0-25%,BMI    Nutrition Interventions:   Food and/or Nutrient Delivery: Continue current diet,Start oral nutrition supplement  Nutrition Education and Counseling: No recommendations at this time  Coordination of Nutrition Care: Continue to monitor while inpatient,Interdisciplinary rounds    Goals:  PO intake >/= 75% of all meals and supplements within 5-7 days       Nutrition Monitoring and Evaluation:   Behavioral-Environmental Outcomes: None identified  Food/Nutrient Intake Outcomes: Food and nutrient intake,Supplement intake  Physical Signs/Symptoms Outcomes: Biochemical data,Skin,Weight    Discharge Planning:    Too soon to determine     Electronically signed by Sharyne Mania, RD on 02/07/2021  Contact: [phone]

## 2021-02-07 NOTE — Progress Notes (Signed)
Verbal shift change report given to Will (oncoming nurse) by Nira Conn (offgoing nurse). Report included the following information SBAR, Kardex, MAR, Recent Results and Cardiac Rhythm Sinus.     0800 - Assessment complete, pt repositioned    0900 - rounded with Intensivist, pt used bedpan, large melena stool    1000 - ECHO @ bedside, pt repositioned, O2 reapplied    1100 - IDR    1200 - Assessment complete, blood drawn    1400 - Pt repositioned, GI rounded, more bloodwork, Niece @ bedside     1600 - Assessment complete, pt repositioned    1800 - Pt used the bedpan X3, melena stool    1900 - Verbal shift change report given to [employer] (Cabin crew) by Will (offgoing nurse). Report included the following information SBAR, Kardex, Intake/Output, MAR, Recent Results and Cardiac Rhythm Sinus .

## 2021-02-07 NOTE — Progress Notes (Signed)
1900: Bedside and Verbal shift change report given to Rejeana Brock, Charity fundraiser (Cabin crew) by Will, RN (offgoing nurse). Report included the following information SBAR, Kardex, ED Summary, Intake/Output, MAR, Recent Results and Cardiac Rhythm SR.     2000: Assessment completed, see doc flow sheet. Pt turned and repositioned.    2200: Pt turned and repositioned.    0000: Assessment completed, see doc flow sheet. Pt turned and repositioned.    0200: Pt turned and repositioned.    0400: Assessment completed, see doc flow sheet. Pt turned and repositioned.    0600: Pt turned and repositioned.    0700: Bedside and Verbal shift change report given to Kimber Relic, Charity fundraiser (Cabin crew) by Rejeana Brock, RN (offgoing nurse). Report included the following information SBAR, Kardex, ED Summary, Intake/Output, MAR, Recent Results and Cardiac Rhythm SR.

## 2021-02-07 NOTE — Progress Notes (Signed)
Progress  Notes by Beryle Lathe, MD at 02/07/21 1355                Author: Beryle Lathe, MD  Service: Internal Medicine  Author Type: Physician       Filed: 02/07/21 1400  Date of Service: 02/07/21 1355  Status: Signed          Editor: Beryle Lathe, MD (Physician)                        [facility]      Tele ICU TEAM Progress Note         Name:  Savannah Irwin        DOB:  04-09-1946     MRN:  [id number]        Date:  02/07/2021                 ICU Assessment        1.  GIB, melena   2.  Hypotension   3.  Acute blood loss anemia   4.  Pulmonary embolism on AC   5.  AKI      egd 2/28 with nonbleeding duodenal ulcers                  ICU Comprehensive Plan of Care:      NEURO: intact   CV: Keep MAP >65; pressors as needed, fu echo and ct chest, if echo is ok, can trial midodrine   PULM: Continue supplemental o2 as needed    GI:  on iv ppi bid, gi on board, serial h/h   RENAL/GU: Monitor Cr and UOP;  Dose albumin, low dose lasix    ENDO: BG Goal 140-180; glycemic control   HEME/ONC: Tx Hgb < 7, Plt<10k; transfuse as needed   MICRO:  FU on Cx; cont broad abx coverage   Code Status: Full Code   LOS: 2   Prophylaxis: GI: PPI   DVT:  On hold due to GIB       Discussed Care Plan with Bedside RN            Subjective:     Progress Note: 02/07/2021        Reason for ICU Admission: GIB      S: denies CP or SOB, ongoing abd painimproved, + melanotic stool this AM.            Active Problem List:           Problem List   Never Reviewed                       Codes  Class            Hemorrhagic shock (HCC)  ICD-10-CM: R57.8   ICD-9-CM: 785.59                        Acute blood loss anemia  ICD-10-CM: D62   ICD-9-CM: 285.1                              Past Medical History:         has a past medical history of Acquired hypothyroidism, Emphysema lung (HCC), Fibromyalgia, Gout, Graves' disease, Heart failure with preserved ejection fraction (HCC), Pulmonary  hypertension (HCC), and Type 2 diabetes mellitus  (HCC).  Past Surgical History:         has a past surgical history that includes hx hysterectomy and hx cholecystectomy (1972).        Home Medications:          Prior to Admission medications             Medication  Sig  Start Date  End Date  Taking?  Authorizing Provider            apixaban (ELIQUIS) 5 mg tablet  Take 5 mg by mouth two (2) times a day.      Yes  Provider, Historical     levothyroxine (SYNTHROID) 50 mcg tablet  Take 50 mcg by mouth Daily (before breakfast).        Provider, Historical     pantoprazole (PROTONIX) 40 mg tablet  Take 40 mg by mouth daily.        Provider, Historical     sertraline (ZOLOFT) 50 mg tablet  Take 50 mg by mouth daily.        Provider, Historical     sodium chloride 1 gram tablet  Take 1 g by mouth daily. Take one tablet by mouth daily for 7 days        Provider, Historical     ergocalciferol (Vitamin D2) 1,250 mcg (50,000 unit) capsule  Take 50,000 Units by mouth every seven (7) days.        Provider, Historical     atorvastatin (LIPITOR) 10 mg tablet  Take 10 mg by mouth daily.        Provider, Historical     azelastine-fluticasone 137-50 mcg/spray spry  2 Sprays by Nasal route two (2) times a day.        Provider, Historical     calcium carbonate (OS-CAL) 500 mg calcium (1,250 mg) tablet  Take 1,250 Tablets by mouth three (3) times daily.        Provider, Historical     clotrimazole (Lotrimin AF, clotrimazole,) 1 % topical cream  Apply  to affected area two (2) times a day.        Provider, Historical     ipratropium (ATROVENT HFA) 17 mcg/actuation inhaler  Take 2 Puffs by inhalation every six (6) hours as needed for Wheezing.        Provider, Historical            alendronate (FOSAMAX) 70 mg tablet  Take 70 mg by mouth every seven (7) days.        Provider, Historical            allopurinoL (ZYLOPRIM) 100 mg tablet  Take 100 mg by mouth daily.        Provider, Historical     amitriptyline (ELAVIL) 50 mg tablet  Take 50 mg by mouth nightly.        Provider,  Historical            atenoloL (TENORMIN) 25 mg tablet  Take 25 mg by mouth daily.        Provider, Historical             Allergies/Social/Family History:          Allergies        Allergen  Reactions         ?  Penicillins  Itching           Social History          Tobacco Use         ?  Smoking status:  Current Every Day Smoker              Packs/day:  0.50         ?  Smokeless tobacco:  Never Used        ?  Tobacco comment: Patient stated that she started smoking at the age of 60.       Substance Use Topics         ?  Alcohol use:  Not Currently             Comment: Patient denies any history of excessive alcohol intake.           Family History         Problem  Relation  Age of Onset          ?  OSTEOARTHRITIS  Mother       ?  Diabetes  Mother       ?  Hypertension  Mother       ?  Elevated Lipids  Sister       ?  Hypertension  Sister       ?  Thyroid Disease  Sister                Luiz Blare' disease             Review of Systems:         10 pt ros is neg except as noted in S         Objective:     Vital Signs:   Visit Vitals      BP  (!) 98/51     Pulse  (!) 102     Temp  98.2 ??F (36.8 ??C)     Resp  21     Ht   (1.6 m)     Wt  49.6 kg (109 lb 5.6 oz)     SpO2  95%        BMI  19.37 kg/m??      O2 Flow Rate (L/min): 2 l/min  O2 Device: None (Room air) Temp (24hrs), Avg:98.2 ??F (36.8 ??C), Min:97.9 ??F (36.6 ??C), Max:98.8 ??F (37.1 ??C)               Intake/Output:       Intake/Output Summary (Last 24 hours) at 02/07/2021 1355   Last data filed at 02/07/2021 1141     Gross per 24 hour        Intake  2862.25 ml        Output  1825 ml        Net  1037.25 ml           Physical Exam:    Gen: letahrgic, ill appearing   HEENT: PERRL  NC, AT   Neck: no JVD, midline trach   Mouth: no oral bleeding   CV: NSR   Pulm: symmetric chest rise  Abd: NT ND soft   Ext: no edema, cyanosis, clubbing   MSK: no deformities, no synovitis   Neuro: lethargic, weak      LABS AND  DATA: Personally reviewed     Recent Labs            02/07/21    1136  02/06/21   0345     WBC  8.1  11.9*     HGB  7.7*  8.0*     HCT  23.7*  24.5*  PLT  206  228          Recent Labs              02/07/21   1136  02/06/21   0345  02/05/21   0402  02/04/21   2222     NA  137  138    < >  136     K  4.0  4.2    < >  5.2*     CL  109*  111*    < >  109*     CO2  23  22    < >  17*     BUN  20  36*    < >  82*     CREA  0.91  0.91    < >  1.34*     GLU  193*  94    < >  286*     CA  8.0*  8.0*    < >  7.9*     MG   --    --    --   1.6     PHOS   --    --    --   3.2        < > = values in this interval not displayed.          Recent Labs            02/07/21   1136  02/05/21   0402     AP  42*  45     TP  5.4*  5.7*     ALB  2.6*  2.2*     GLOB  2.8  3.5         LPSE   --   25*          Recent Labs            02/07/21   1136  02/06/21   0345     INR  1.1  1.1         PTP  11.8*  11.6*         No results for input(s): PHI, PCO2I, PO2I, FIO2I in the last 72 hours.   No results for input(s): CPK, CKMB, TROIQ, BNPP in the last 72 hours.      Hemodynamics:         PAP:     CO:        Wedge:     CI:        CVP:      SVR:                 PVR:           Ventilator Settings:          Mode  Rate  Tidal Volume  Pressure  FiO2  PEEP                                        Peak airway pressure:            Minute ventilation:               MEDS: Reviewed          I performed all aspects of the physical examination via Telemedicine associated with two way audio and video communication and with the on-site  assistance of Nurse.  [place] and the [place] [facility]. Patient is critically ill in the ICU.   I  personally  reviewed the pertinent medical records, laboratory/ pathology data and radiographic images.  The decision making regarding this patient is as documented above, which was generated  following   discussion  with the multidisciplinary team and creation of a treatment plan for  the patient. We discussed the patient's  interval history and future coordination of care and  plans.  The patient's medications  were reviewed and changes made as stipulated  above.  Due to  critical illness impairing one or more vital organs of this patient resulting in life threatening clinical situation  I have provided direct, frequent personal  assessment and manipulation in management plan and spent 30  minutes  of   critical care time excluding the time spent on procedures and teaching.  Greater than 50% of this time  in patient's care was  employed  in counseling and coordination of care and engaged in face to face discussion of case management issues, addressing  questions, and outlining a plan of  therapy.      Beryle LatheHaala Rokadia, MD   [facility]   02/07/2021

## 2021-02-07 NOTE — Progress Notes (Signed)
Occupational Therapy  Order received and chart reviewed, attempted to see however patient politely declined at this time. Noted continued low blood pressure at rest . Will continue to follow.   Rosezella [place] Ellyson Rarick, OTR/L

## 2021-02-07 NOTE — Progress Notes (Signed)
Follow-up spiritual assessment with Ms. Savannah Irwin on ICU.  Pt warmly welcomes Chaplain and shares much about her life and faith. She os of a non-denominational church. She had 4 brothers, two are still living. Her late brother was the pastor of the church she attends. She adopted her 3 nieces. After sharing much life review, she expressed that she would like prayer; Chaplain prayed for her and she expressed much thanks and welcomed more visits.  Advised nurse to contact Spiritual Care for any further referrals.    Chaplain Hale Bogus, M.Div.  Johnathan Hausen [phone])

## 2021-02-08 ENCOUNTER — Inpatient Hospital Stay: Payer: MEDICARE | Attending: Student in an Organized Health Care Education/Training Program

## 2021-02-08 LAB — CBC W/O DIFF
ABSOLUTE NRBC: 0.03 10*3/uL — ABNORMAL HIGH (ref 0.00–0.01)
HCT: 24.9 % — ABNORMAL LOW (ref 35.0–47.0)
HGB: 8.1 g/dL — ABNORMAL LOW (ref 11.5–16.0)
MCH: 30.2 PG (ref 26.0–34.0)
MCHC: 32.5 g/dL (ref 30.0–36.5)
MCV: 92.9 FL (ref 80.0–99.0)
MPV: 8.5 FL — ABNORMAL LOW (ref 8.9–12.9)
NRBC: 0.4 PER 100 WBC — ABNORMAL HIGH
PLATELET: 201 10*3/uL (ref 150–400)
RBC: 2.68 M/uL — ABNORMAL LOW (ref 3.80–5.20)
RDW: 19 % — ABNORMAL HIGH (ref 11.5–14.5)
WBC: 7 10*3/uL (ref 3.6–11.0)

## 2021-02-08 LAB — VITAMIN B12
Vitamin B-12: 696 pg/mL (ref 193–986)
Vitamin B12: 696 pg/mL (ref 193–986)

## 2021-02-08 LAB — FERRITIN
Ferritin: 238 NG/ML (ref 26–388)
Ferritin: 238 NG/ML (ref 26–388)

## 2021-02-08 LAB — FOLATE
Folate: 5.5 ng/mL (ref 5.0–21.0)
Folate: 5.5 ng/mL (ref 5.0–21.0)

## 2021-02-08 LAB — CBC
Hematocrit: 24.9 % — ABNORMAL LOW (ref 35.0–47.0)
Hemoglobin: 8.1 g/dL — ABNORMAL LOW (ref 11.5–16.0)
MCH: 30.2 PG (ref 26.0–34.0)
MCHC: 32.5 g/dL (ref 30.0–36.5)
MCV: 92.9 FL (ref 80.0–99.0)
MPV: 8.5 FL — ABNORMAL LOW (ref 8.9–12.9)
NRBC Absolute: 0.03 10*3/uL — ABNORMAL HIGH (ref 0.00–0.01)
Nucleated RBCs: 0.4 PER 100 WBC — ABNORMAL HIGH
Platelets: 201 10*3/uL (ref 150–400)
RBC: 2.68 M/uL — ABNORMAL LOW (ref 3.80–5.20)
RDW: 19 % — ABNORMAL HIGH (ref 11.5–14.5)
WBC: 7 10*3/uL (ref 3.6–11.0)

## 2021-02-08 MED ORDER — LEVOTHYROXINE 50 MCG TAB
50 mcg | Freq: Every day | ORAL | Status: DC
Start: 2021-02-08 — End: 2021-02-10
  Administered 2021-02-08 – 2021-02-10 (×3): via ORAL

## 2021-02-08 MED ORDER — POLYETHYLENE GLYCOL 3350 17 GRAM (100 %) ORAL POWDER PACKET
17 gram | ORAL | Status: AC
Start: 2021-02-08 — End: 2021-02-09
  Administered 2021-02-09 (×4): via ORAL

## 2021-02-08 MED ORDER — POLYETHYLENE GLYCOL 3350 17 GRAM (100 %) ORAL POWDER PACKET
17 gram | ORAL | Status: AC
Start: 2021-02-08 — End: 2021-02-08
  Administered 2021-02-08 (×8): via ORAL

## 2021-02-08 MED FILL — POLYETHYLENE GLYCOL 3350 17 GRAM (100 %) ORAL POWDER PACKET: 17 gram | ORAL | Qty: 1

## 2021-02-08 MED FILL — ATORVASTATIN 10 MG TAB: 10 mg | ORAL | Qty: 1

## 2021-02-08 MED FILL — AMITRIPTYLINE 25 MG TAB: 25 mg | ORAL | Qty: 2

## 2021-02-08 MED FILL — ATENOLOL 25 MG TAB: 25 mg | ORAL | Qty: 1

## 2021-02-08 MED FILL — LIDOCAINE 4 % TOPICAL PATCH (12 HOUR DURATION): 4 % | CUTANEOUS | Qty: 1

## 2021-02-08 MED FILL — LEVOTHYROXINE 50 MCG TAB: 50 mcg | ORAL | Qty: 1

## 2021-02-08 MED FILL — ALLOPURINOL 100 MG TAB: 100 mg | ORAL | Qty: 1

## 2021-02-08 MED FILL — PANTOPRAZOLE 40 MG TAB, DELAYED RELEASE: 40 mg | ORAL | Qty: 1

## 2021-02-08 MED FILL — MIDODRINE 5 MG TAB: 5 mg | ORAL | Qty: 1

## 2021-02-08 MED FILL — ACETAMINOPHEN 325 MG TABLET: 325 mg | ORAL | Qty: 2

## 2021-02-08 MED FILL — SERTRALINE 50 MG TAB: 50 mg | ORAL | Qty: 1

## 2021-02-08 NOTE — Progress Notes (Signed)
Problem: Mobility Impaired (Adult and Pediatric)  Goal: *Acute Goals and Plan of Care (Insert Text)  Description: FUNCTIONAL STATUS PRIOR TO ADMISSION: Patient was modified independent using a single point cane for functional mobility.    HOME SUPPORT PRIOR TO ADMISSION: Pt was staying with niece in niece's apartment    Physical Therapy Goals  Initiated 02/08/2021  1.  Patient will move from supine to sit and sit to supine  in bed with modified independence within 7 day(s).    2.  Patient will transfer from bed to chair and chair to bed with supervision/set-up using the least restrictive device within 7 day(s).  3.  Patient will perform sit to stand with supervision/set-up within 7 day(s).  4.  Patient will ambulate with supervision/set-up for 150 feet with the least restrictive device within 7 day(s).   5.  Patient will ascend/descend 4 stairs with one handrail(s) with supervision/set-up within 7 day(s).    Outcome: Not Met   PHYSICAL THERAPY EVALUATION  Patient: Savannah Irwin (75 y.o. female)  Date: 02/08/2021  Primary Diagnosis: Hemorrhagic shock (HCC) [R57.8]  Acute blood loss anemia [D62]  Procedure(s) (LRB):  ESOPHAGOGASTRODUODENOSCOPY (EGD) (N/A) 2 Days Post-Op   Precautions:   Fall,Skin    ASSESSMENT  Based on the objective data described below, the patient presents with generally decreased strength and endurance, impaired standing balance, and limited functional mobility after admission 01/04/21 with hemorrhagic shock and anemia. She is s/p EGD with findings of multiple ulcers and is awaiting colonoscopy tomorrow. With encouragement pt mobilizes to edge of bed, dangles without support x 8 mins, transfers, and ambulates briefly to bedside chair using rollator. Pt reports fatigue is too severe to continue gait training today. Pt encouraged to mobilize out of bed with staff assist for all meals.     Current Level of Function Impacting Discharge (mobility/balance): min A    Functional Outcome Measure:  The patient  scored 8 on the Tinetti outcome measure which is indicative of high fall risk.      Other factors to consider for discharge: none additional     Patient will benefit from skilled therapy intervention to address the above noted impairments.       PLAN :  Recommendations and Planned Interventions: bed mobility training, transfer training, gait training, therapeutic exercises, neuromuscular re-education, edema management/control, patient and family training/education, and therapeutic activities      Frequency/Duration: Patient will be followed by physical therapy:  5 times a week to address goals.    Recommendation for discharge: (in order for the patient to meet his/her long term goals)  Therapy up to 5 days/week in SNF setting if discharged at current functional level; may progress to home with HHPT with continued PT treatments in this setting.    This discharge recommendation:  Has been made in collaboration with the attending provider and/or case management    IF patient discharges home will need the following DME: RW vs. Rollator; TBD         SUBJECTIVE:   Patient stated "I'm so tired."    Pt received supine, agreeable to PT and cleared by RN.    OBJECTIVE DATA SUMMARY:   HISTORY:    Past Medical History:   Diagnosis Date    Acquired hypothyroidism     Anxiety and depression     Emphysema lung (HCC)     Fibromyalgia     Gout     Graves' disease     Heart failure with preserved ejection fraction (  [facility])     Pulmonary hypertension [facility])     Per records from [facility], patient has a history of severe pulmonary hypertension as admission of February, 2022.  Previously, echocardiogram of 11/05/2016 at [facility] showed mild pulmonary hypertension, normal calculated LVEF of 63%, mild tricuspid regurgitation and trace mitral regurgitation.    Type 2 diabetes mellitus (HCC)     Underweight      Past Surgical History:   Procedure Laterality Date    HX CHOLECYSTECTOMY  1972    HX  HYSTERECTOMY         Personal factors and/or comorbidities impacting plan of care: as above    Home Situation  Home Environment: Apartment  # Steps to Enter: 4  Rails to Enter: Yes  One/Two Story Residence: One story  Living Alone: No  Support Systems: Other Family Member(s)  Current DME Used/Available at Home: Cane, straight    EXAMINATION/PRESENTATION/DECISION MAKING:   Critical Behavior:  Neurologic State: Alert,Eyes open spontaneously  Orientation Level: Oriented X4  Cognition: Follows commands          Skin:  LE exposed skin intact  Edema: none noted LEs  Range Of Motion:  AROM: Generally decreased, functional                       Strength:    Strength: Generally decreased, functional                    Tone & Sensation:   Tone: Normal                              Coordination:  Coordination: Within functional limits    Functional Mobility:  Bed Mobility:     Supine to Sit: Additional time;Contact guard assistance;Adaptive equipment     Scooting: Additional time;Contact guard assistance  Transfers:  Sit to Stand: Additional time;Minimum assistance  Stand to Sit: Additional time;Minimum assistance                       Balance:   Sitting: Without support  Sitting - Static: Good (unsupported)  Sitting - Dynamic: Fair (occasional)  Standing: With support  Standing - Static: Fair  Standing - Dynamic : Fair  Ambulation/Gait Training:  Distance (ft): 8 Feet (ft)  Assistive Device: Walker, rollator;Gait belt  Ambulation - Level of Assistance: Minimal assistance        Gait Abnormalities: Decreased step clearance        Base of Support: Narrowed     Speed/Cadence: Pace decreased (<100 feet/min)                     Education and assistance for rollator use.                   Functional Measure:  Tinetti test:    Sitting Balance: 1  Arises: 0  Attempts to Rise: 0  Immediate Standing Balance: 1  Standing Balance: 0  Nudged: 0  Eyes Closed: 0  Turn 360 Degrees - Continuous/Discontinuous: 0  Turn 360 Degrees -  Steady/Unsteady: 0  Sitting Down: 0  Balance Score: 2 Balance total score  Indication of Gait: 1  R Step Length/Height: 0  L Step Length/Height: 0  R Foot Clearance: 1  L Foot Clearance: 1  Step Symmetry: 1  Step Continuity: 1  Path: 1  Trunk: 0  Walking Time: 0  Gait Score: 6 Gait total score  Total Score: 8/28 Overall total score         Tinetti Tool Score Risk of Falls  <19 = High Fall Risk  19-24 = Moderate Fall Risk  25-28 = Low Fall Risk  Tinetti ME. Performance-Oriented Assessment of Mobility Problems in Elderly Patients. JAGS 1986; [id number]. (Scoring Description: PT Bulletin Feb. 10, 1993)    Older adults: Lonn Georgia et al, 2009; n = 1000 [place] elderly evaluated with ABC, POMA, ADL, and IADL)   Mean POMA score for males aged 65-79 years = 26.21(3.40)   Mean POMA score for females age 10-79 years = 25.16(4.30)   Mean POMA score for males over 80 years = 23.29(6.02)   Mean POMA score for females over 80 years = 17.20(8.32)            Physical Therapy Evaluation Charge Determination   History Examination Presentation Decision-Making   HIGH Complexity :3+ comorbidities / personal factors will impact the outcome/ POC  HIGH Complexity : 4+ Standardized tests and measures addressing body structure, function, activity limitation and / or participation in recreation  MEDIUM Complexity : Evolving with changing characteristics  Other outcome measures tinetti  HIGH       Based on the above components, the patient evaluation is determined to be of the following complexity level: HIGH     Pain Rating:  States abdominal discomfort, improved compared with time of admission; does not rate.  Offered education, encouragement, repositioning.    Activity Tolerance:   requires frequent rest breaks    After treatment patient left in no apparent distress:   Sitting in chair, Call bell within reach, and Bed / chair alarm activated    COMMUNICATION/EDUCATION:   The patient's plan of care was discussed with: Occupational therapist and  Registered nurse.     Fall prevention education was provided and the patient/caregiver indicated understanding., Patient/family have participated as able in goal setting and plan of care., and Patient/family agree to work toward stated goals and plan of care.    Thank you for this referral.  Margrett Rud, PT, DPT   Time Calculation: 27 mins

## 2021-02-08 NOTE — Progress Notes (Signed)
 Problem: Self Care Deficits Care Plan (Adult)  Goal: *Acute Goals and Plan of Care (Insert Text)  Description: FUNCTIONAL STATUS PRIOR TO ADMISSION: Patient was modified independent using a single point cane for functional mobility.     HOME SUPPORT: The patient lived with her niece in niece's apartment.    Occupational Therapy Goals  Initiated 02/08/2021  1.  Patient will perform grooming with modified independence in standing within 7 day(s).  2.  Patient will perform lower body dressing with supervision/set-up within 7 day(s).  3.  Patient will perform toilet transfers with modified independence within 7 day(s).  4.  Patient will perform all aspects of toileting with modified independence within 7 day(s).  5.  Patient will participate in upper extremity therapeutic exercise/activities with independence for 5 minutes within 7 day(s).    6.  Patient will utilize energy conservation techniques during functional activities with verbal cues within 7 day(s).   Outcome: Progressing Towards Goal  OCCUPATIONAL THERAPY EVALUATION  Patient: Savannah Irwin (75 y.o. female)  Date: 02/08/2021  Primary Diagnosis: Hemorrhagic shock (HCC) [R57.8]  Acute blood loss anemia [D62]  Procedure(s) (LRB):  ESOPHAGOGASTRODUODENOSCOPY (EGD) (N/A) 2 Days Post-Op   Precautions:  Fall,Skin    ASSESSMENT  Based on the objective data described below, the patient presents with decreased balance, endurance and strength, and impaired functional mobility following admission for acute blood loss anemia and hemorrhagic shock. At baseline pt is mod I for ADLs and functional mobility with SPC. She was received seated in chair, requesting to go to the bathroom. Using RW pt ambulated to bathroom and transferred to toilet with min A and cues for safe RW management and transfer technique. She performed toilet hygiene with CGA, and completed grooming in sitting due to decreased activity tolerance. Pt transferred to bed at end of session with needs met, VSS. She  is functioning below her baseline at this time and will benefit from skilled therapy intervention to address the above noted impairments.      Current Level of Function Impacting Discharge (ADLs/self-care): Min A transfers, setup-max A ADLs    Functional Outcome Measure:  The patient scored 50/100 on the Barthel Index outcome measure.      Other factors to consider for discharge: mod I baseline       PLAN :  Recommendations and Planned Interventions: self care training, functional mobility training, therapeutic exercise, balance training, therapeutic activities, endurance activities, patient education, home safety training, and family training/education    Frequency/Duration: Patient will be followed by occupational therapy 5 times a week to address goals.    Recommendation for discharge: (in order for the patient to meet his/her long term goals)  Therapy up to 5 days/week in SNF setting vs. Home with [facility] therapy and assistance pending progress    This discharge recommendation:  Has not yet been discussed the attending provider and/or case management    IF patient discharges home will need the following DME: TBD       SUBJECTIVE:   Patient stated "I'd like to walk to the bathroom."    OBJECTIVE DATA SUMMARY:   HISTORY:   Past Medical History:   Diagnosis Date   . Acquired hypothyroidism    . Anxiety and depression    . Emphysema lung (HCC)    . Fibromyalgia    . Gout    . Graves' disease    . Heart failure with preserved ejection fraction (HCC)    . Pulmonary hypertension (HCC)  Per records from [facility], patient has a history of severe pulmonary hypertension as admission of February, 2022.  Previously, echocardiogram of 11/05/2016 at [facility] showed mild pulmonary hypertension, normal calculated LVEF of 63%, mild tricuspid regurgitation and trace mitral regurgitation.   . Type 2 diabetes mellitus (HCC)    . Underweight      Past Surgical History:   Procedure Laterality Date    . HX CHOLECYSTECTOMY  1972   . HX HYSTERECTOMY         Expanded or extensive additional review of patient history:     Home Situation  Home Environment: Apartment  # Steps to Enter: 4  Rails to Enter: Yes  One/Two Story Residence: One story  Living Alone: No  Support Systems: Other Family Member(s)  Current DME Used/Available at Home: Cane, straight    Hand dominance: Right    EXAMINATION OF PERFORMANCE DEFICITS:  Cognitive/Behavioral Status:  Neurologic State: Alert  Orientation Level: Oriented X4  Cognition: Follows commands  Perception: Appears intact  Perseveration: No perseveration noted  Safety/Judgement: Awareness of environment;Fall prevention    Vision/Perceptual:              Acuity: Within Defined Limits         Range of Motion:    AROM: Generally decreased, functional       Strength:  Strength: Generally decreased, functional                Coordination:  Coordination: Within functional limits  Fine Motor Skills-Upper: Left Intact;Right Intact    Gross Motor Skills-Upper: Left Intact;Right Intact    Tone & Sensation:  Tone: Normal       Balance:  Sitting: Without support  Sitting - Static: Good (unsupported)  Sitting - Dynamic: Fair (occasional)  Standing: With support  Standing - Static: Fair  Standing - Dynamic : Fair    Functional Mobility and Transfers for ADLs:  Bed Mobility:  Supine to Sit: Additional time;Contact guard assistance;Adaptive equipment  Scooting: Additional time;Contact guard assistance    Transfers:  Sit to Stand: Additional time;Minimum assistance  Stand to Sit: Additional time;Minimum assistance  Toilet Transfer : Minimum assistance;Adaptive equipment;Additional time    ADL Assessment:  Feeding: Independent    Oral Facial Hygiene/Grooming: Setup (seated)    Bathing: Moderate assistance    Upper Body Dressing: Setup    Lower Body Dressing: Maximum assistance    Toileting: Minimum assistance       ADL Intervention and task modifications:       Grooming  Washing Hands: Set-up  (seated)      Toileting  Bowel Hygiene: Contact guard assistance  Clothing Management: Minimum assistance    Cognitive Retraining  Safety/Judgement: Awareness of environment;Fall prevention    Functional Measure:    Barthel Index:  Bathing: 0  Bladder: 5  Bowels: 10  Grooming: 5  Dressing: 5  Feeding: 10  Mobility: 0  Stairs: 0  Toilet Use: 5  Transfer (Bed to Chair and Back): 10  Total: 50/100      The Barthel ADL Index: Guidelines  1. The index should be used as a record of what a patient does, not as a record of what a patient could do.  2. The main aim is to establish degree of independence from any help, physical or verbal, however minor and for whatever reason.  3. The need for supervision renders the patient not independent.  4. A patient's performance should be established using  the best available evidence. Asking the patient, friends/relatives and nurses are the usual sources, but direct observation and common sense are also important. However direct testing is not needed.  5. Usually the patient's performance over the preceding 24-48 hours is important, but occasionally longer periods will be relevant.  6. Middle categories imply that the patient supplies over 50 per cent of the effort.  7. Use of aids to be independent is allowed.    Score Interpretation (from Sinoff 1997)   80-100 Independent   60-79 Minimally independent   40-59 Partially dependent   20-39 Very dependent   <20 Totally dependent     -Mahoney, F.l., Barthel, D.W. (1965). Functional evaluation: the Barthel Index. [place] [address] [place] (14)2.  -Sinoff, G., Ore, L. (1997). The Barthel activities of daily living index: self-reporting versus actual performance in the old (> or = 75 years). Journal of American Geriatric Society 45(7), [phone].   -Fleeta cotton [place], J.J.M.F, Orman ROES., Oneita MERYL Sebastian DELORSE. (1999). Measuring the change in disability after inpatient rehabilitation; comparison of the responsiveness of the Barthel Index and  Functional Independence Measure. Journal of Neurology, Neurosurgery, and Psychiatry, 66(4), [phone].  Marea Chillington, N.J.A, Scholte op [place],  W.J.M, & Koopmanschap, M.A. (2004) Assessment of post-stroke quality of life in cost-effectiveness studies: The usefulness of the Barthel Index and the EuroQoL-5D. Quality of Life Research, 89, 572-56        Occupational Therapy Evaluation Charge Determination   History Examination Decision-Making   LOW Complexity : Brief history review  MEDIUM Complexity : 3-5 performance deficits relating to physical, cognitive , or psychosocial skils that result in activity limitations and / or participation restrictions LOW Complexity : No comorbidities that affect functional and no verbal or physical assistance needed to complete eval tasks       Based on the above components, the patient evaluation is determined to be of the following complexity level: LOW   Pain Rating:  Pt did not c/o pain    Activity Tolerance:   Fair and requires rest breaks    After treatment patient left in no apparent distress:    Supine in bed, Call bell within reach, Bed / chair alarm activated, and Side rails x 3    COMMUNICATION/EDUCATION:   The patient's plan of care was discussed with: Physical therapist and Registered nurse.     Home safety education was provided and the patient/caregiver indicated understanding., Patient/family have participated as able in goal setting and plan of care., and Patient/family agree to work toward stated goals and plan of care.    This patient's plan of care is appropriate for delegation to OTA.    Thank you for this referral.  Lauraine FORBES Dross, OT  Time Calculation: 22 mins

## 2021-02-08 NOTE — Progress Notes (Deleted)
TRANSFER - OUT REPORT:    Verbal report given to [facility] on Rosemarie Galvis being transferred to 4th floor for routine progression of care   Report consisted of patient's Situation, Background, Assessment and   Recommendations(SBAR).      Marliss Czar    I have requested for the CM assistant to please reschedule pt with Dr Elray Buba and to let Dr Lynnea Maizes office know pt is still hospitalized.Marliss Czar

## 2021-02-08 NOTE — Progress Notes (Signed)
Pt transferred to 416, report given to RN. Patient alert and oriented, follows command, moves all extremities. Folley removed prior to transfer at 1200. Afebrile. Neice at bedside and notified of transfer, transferred with belongings by niece. Will continue to monitor

## 2021-02-08 NOTE — Progress Notes (Signed)
Progress  Notes by Mariea Stable, NP at 02/08/21 1456                Author: Mariea Stable, NP  Service: Gastroenterology  Author Type: Nurse Practitioner       Filed: 02/08/21 1502  Date of Service: 02/08/21 1456  Status: Attested           Editor: Mariea Stable, NP (Nurse Practitioner)  Cosigner: Arman Filter, MD at 02/09/21 [phone]          Attestation signed by Arman Filter, MD at 02/09/21 [phone]          Agree with NP Maveryk Renstrom's note and plan.                                 [place] [facility]   Mariea Stable NP   [phone]               GI PROGRESS NOTE            NAME: Savannah Irwin    DOB:  22-Jan-1946    MRN:  [id number]            Subjective:     Her stomach feels a little sore.  Denies any further rectal bleeding today.            Objective:     NAD         VITALS:    Last 24hrs VS reviewed since prior progress note. Most recent are:   Visit Vitals      BP  (!) 103/58 (BP 1 Location: Right upper arm, BP Patient Position: At rest;Supine)     Pulse  88     Temp  99.5 ??F (37.5 ??C)     Resp  21     Ht  5\' 3"  (1.6 m)     Wt  49.6 kg (109 lb 5.6 oz)     SpO2  97%        BMI  19.37 kg/m??           Intake/Output Summary (Last 24 hours) at 02/08/2021 1457   Last data filed at 02/08/2021 1130     Gross per 24 hour        Intake  210 ml        Output  2415 ml        Net  -2205 ml           PHYSICAL EXAM:   General: Alert, in no acute distress     HEENT: Anicteric sclerae.   Lungs:            CTA Bilaterally.    Heart:  Regular  rhythm,     Abdomen: Soft, Non distended, Non tender.  (+)Bowel sounds, no HSM   Extremities: No c/c/e   Neurologic:  CN 2-12 gi, Alert and oriented X 3.  No acute neurological distress    Psych:   Good insight. Not anxious nor agitated.      Lab Data Reviewed:      Recent Labs            02/08/21   0600  02/07/21   1136     WBC  7.0  8.1     HGB  8.1*  7.7*     HCT  24.9*  23.7*         PLT  201  206          Recent Labs            02/07/21   1136  02/06/21   0345     NA  137  138      K  4.0  4.2     CL  109*  111*     CO2  23  22     BUN  20  36*     CREA  0.91  0.91     GLU  193*  94         CA  8.0*  8.0*          Recent Labs           02/07/21   1136     AP  42*     TP  5.4*     ALB  2.6*        GLOB  2.8           ________________________________________________________________________     Patient Active Problem List        Diagnosis  Code         ?  Hemorrhagic shock (HCC)  R57.8     ?  Acute blood loss anemia  D62     ?  Type 2 diabetes mellitus (HCC)  E11.9     ?  Pulmonary hypertension (HCC)  I27.20     ?  Heart failure with preserved ejection fraction (HCC)  I50.30     ?  Graves' disease  E05.00     ?  Gout  M10.9     ?  Acquired hypothyroidism  E03.9     ?  Emphysema lung (HCC)  J43.9     ?  Fibromyalgia  M79.7     ?  Anxiety and depression  F41.9, F32.A     ?  Underweight  R63.6     ?  Leukocytosis  D72.829     ?  Upper GI bleed  K92.2         ?  Hypoalbuminemia  E88.09              Assessment and Plan:   Melena:  Status post EGD which showed 4 superficial duodenal ulcers.  Her hemoglobin is stable today at 8.1.  No further melena today.    ??   - Continue to monitor hemoglobin   - Continue acid suppression   - GI bland diet   - Will arrange for colonoscopy tomorrow before her discharge.   ??   Following             Signed By:  Mariea Stable, NP           02/08/2021  2:57 PM

## 2021-02-08 NOTE — Progress Notes (Signed)
Called PCP Dr Elray Buba to explain reason why patient did not go to scheduled appointment for today and also advised once she was closer to discharge will call to schedule follow up appointment.Marland Kitchen

## 2021-02-08 NOTE — Progress Notes (Deleted)
 Transition of care note:    RUR 15%    Reason for Admission:????Hemorrhagic shock,GIB,black tarry stools.light headedness,was on eliquis prior to admission for PE  ??  Plans:    Colonoscopy planned for tomorrow.    Pt declined PT/OT evaluations today.    I updated [facility] who has accepted pt for services when discharged regarding current plan for pt.    Marliss Czar

## 2021-02-08 NOTE — Progress Notes (Signed)
 02/08/2021   3:54 PM   CM spoke w/ PT, they are recommending SNF if pt discharges tomorrow d/t weakness, HH has been arranged w/ [facility].   CM met w/ pt she confirmed her charted demographics, she lives w/ her niece Savannah Irwin. We discussed SNF, pt does not want to DC to SNF, she confirmed her niece will be at home to assist her and also provide transport home at DC.   GI following , plan for colonoscopy 3/3   CM will continue to follow and assist.  Georgann Salt, BS   Case Manager    2:33 PM  TRANSFER - IN REPORT:    Verbal report received  From Beth Aristov on  Savannah Irwin being transferred to Med/Surg  4 Rm 416 for transfer of care  Report consisted of patient's Situation, Background, Assessment and   Recommendations(SBAR).      Transition of Care Plan from [facility]     RUR 16% Moderate Risk of Readmission}   Is This a Readmission   NO   Is this a Bundle NO    Transition of care note:    RUR 15%    Reason for Admission:Hemorrhagic shock,GIB,black tarry stools.light headedness,was on eliquis prior to admission for PE    Plans:    Colonoscopy planned for tomorrow.    Pt declined PT/OT evaluations today.    I updated [facility] who has accepted pt for services when discharged regarding current plan for pt.  Elizabeth Aristov,RN      Georgann Salt, BS   Case Manager

## 2021-02-08 NOTE — Progress Notes (Signed)
Physician Progress Note      PATIENTAZUSENA, Savannah Irwin  CSN #:                  [id number]  DOB:                       October 28, 1946  ADMIT DATE:       02/04/2021 10:01 PM  DISCH DATE:  RESPONDING  PROVIDER #:        Salvadore Dom MD          QUERY TEXT:    Dear Attending,    Patient admitted with GIB, shock, and AKI, noted to have 3/1 RD assessment. If possible, please document in progress notes and discharge summary if you are evaluating and /or treating any of the following:    The medical record reflects the following:  Risk Factors: advanced age, acute illness  Clinical Indicators: BMI: 19.37 with Ht: 5\' 3"  (1.6 m) and Wt: 49.6 kg (109 lb 5.6 oz)  - 3/1 RD- Malnutrition Status:  Mild malnutrition  Context:  Acute illness  Findings of the 6 clinical characteristics of malnutrition:  Body Fat Loss:  1 - Mild body fat loss, Buccal region  Treatment: RD assessment, Ensure HP breakfast and dinner, monitoring    ASPEN Criteria:  https://aspenjournals.onlinelibrary.wiley.com/doi/full/10.1177/0148607112440285      Thank you,    10-12-2004, RN  CDMP  [phone]  Options provided:  -- Protein calorie malnutrition mild  -- Underweight with BMI 19.37  -- Other - I will add my own diagnosis  -- Disagree - Not applicable / Not valid  -- Disagree - Clinically unable to determine / Unknown  -- Refer to Clinical Documentation Reviewer    PROVIDER RESPONSE TEXT:    This patient has mild protein calorie malnutrition.    Query created by: [email or web address] on 02/08/2021 1:36 PM      Electronically signed by:  04/10/2021 MD 02/08/2021 1:51 PM

## 2021-02-08 NOTE — Progress Notes (Signed)
Bedside and Verbal shift change report given to [facility] (Cabin crew) by Huntley Dec (offgoing nurse). Report included the following information SBAR, Kardex, MAR, Accordion and Recent Results.

## 2021-02-08 NOTE — Progress Notes (Signed)
[facility]    Medical Progress Note      NAME: Savannah Irwin   DOB:  October 09, 1946  MRM:  [id number]    Date/Time of service 02/08/2021  12:11 PM          Assessment and Plan:     Acute blood loss anemia - POA, now stable. Serologies normal, suggesting no long term bleeding or deficiencies    Upper GI bleed - POA.  Appreciate GI consult.  GI found non bleeding duodenal ulcers.  PPI BID.  Stop bisphosphanate.  GI plans colonoscopy in AM.    Hemorrhagic shock / Sinus tachycardia - POA, stable today.  Continue midodrine, resumed low dose atenolol to avoid rebound tachycardia.  Stopped lasix. She states she typically has low BP.  Goal MAP 60.    Type 2 diabetes mellitus - Diabetic diet and counseling.  SSI per protocol.  Not on home meds. A1c useless in setting of bleed.    Pulmonary hypertension / Heart failure with preserved ejection fraction - Appears stable. EF stable, but still pHTN on ECHO.  Holding lasix due to low BP    Graves' disease / hypothyroidism - POA, TSH a bit elevated, resume synthroid    Gout - Continue allopurinol.    Emphysema lung - Outpatient pulmonary follow up.  Not on meds    Hyperlipidemia - Continue atorvastatin    Anxiety and depression / Fibromyalgia - Continue sertralilne and amitriptyline    Underweight / Hypoalbuminemia - POA, chronic, would benefit from supplements.    Gastroenteritis / Leukocytosis - Procalcitonin Negative. Cx negative.  Likely viral vs food.  Stop Abx.         Subjective:     Chief Complaint:  Feels better, but still week    ROS:  (bold if positive, if negative)    Tolerating some PT  Tolerating Diet        Objective:     Last 24hrs VS reviewed since prior progress note. Most recent are:    Visit Vitals  BP (!) 99/50   Pulse 90   Temp 98.6 ??F (37 ??C)   Resp 21   Ht 5\' 3"  (1.6 m)   Wt 49.6 kg (109 lb 5.6 oz)   SpO2 93%   BMI 19.37 kg/m??     SpO2 Readings from Last 6 Encounters:   02/07/21 93%    O2 Flow Rate (L/min): 0 l/min       Intake/Output Summary (Last  24 hours) at 02/08/2021 0708  Last data filed at 02/08/2021 0000  Gross per 24 hour   Intake 1597.5 ml   Output 2665 ml   Net -1067.5 ml        Physical Exam:    Gen:  Thin, in no acute distress  HEENT:  Pink conjunctivae, PERRL, hearing intact to voice, moist mucous membranes  Neck:  Supple, without masses, thyroid non-tender  Resp:  No accessory muscle use, clear breath sounds without wheezes rales or rhonchi  Card:  No murmurs, tachycardic S1, S2 without thrills, bruits or peripheral edema  Abd:  Soft, non-tender, non-distended, normoactive bowel sounds are present, no mass  Lymph:  No cervical or inguinal adenopathy  Musc:  No cyanosis or clubbing  Skin:  No rashes or ulcers, skin turgor is good  Neuro:  Cranial nerves are grossly intact, general motor weakness, follows commands   Psych:  Poor insight, oriented to person, place and time, alert    Telemetry reviewed:   normal  sinus rhythm  __________________________________________________________________  Medications Reviewed: (see below)  Medications:     Current Facility-Administered Medications   Medication Dose Route Frequency   ??? midodrine (PROAMATINE) tablet 5 mg  5 mg Oral TID WITH MEALS   ??? pantoprazole (PROTONIX) tablet 40 mg  40 mg Oral ACB&D   ??? sertraline (ZOLOFT) tablet 50 mg  50 mg Oral DAILY   ??? atorvastatin (LIPITOR) tablet 10 mg  10 mg Oral DAILY   ??? atenoloL (TENORMIN) tablet 25 mg  25 mg Oral DAILY   ??? amitriptyline (ELAVIL) tablet 50 mg  50 mg Oral QHS   ??? allopurinoL (ZYLOPRIM) tablet 100 mg  100 mg Oral DAILY   ??? PHENYLephrine (NEO-SYNEPHRINE) 30 mg in 0.9% sodium chloride 250 mL infusion  10-100 mcg/min IntraVENous TITRATE   ??? sodium chloride (NS) flush 5-40 mL  5-40 mL IntraVENous Q8H   ??? sodium chloride (NS) flush 5-40 mL  5-40 mL IntraVENous PRN   ??? acetaminophen (TYLENOL) tablet 650 mg  650 mg Oral Q6H PRN    Or   ??? acetaminophen (TYLENOL) suppository 650 mg  650 mg Rectal Q6H PRN   ??? polyethylene glycol (MIRALAX) packet 17 g  17 g Oral  DAILY PRN   ??? ondansetron (ZOFRAN) injection 4 mg  4 mg IntraVENous Q4H PRN   ??? morphine injection 2 mg  2 mg IntraVENous Q3H PRN   ??? lidocaine 4 % patch 1 Patch  1 Patch TransDERmal Q24H   ??? 0.9% sodium chloride infusion 250 mL  250 mL IntraVENous PRN   ??? 0.9% sodium chloride infusion 250 mL  250 mL IntraVENous PRN        Lab Data Reviewed: (see below)  Lab Review:     Recent Labs     02/08/21  0600 02/07/21  1136 02/06/21  0345   WBC 7.0 8.1 11.9*   HGB 8.1* 7.7* 8.0*   HCT 24.9* 23.7* 24.5*   PLT 201 206 228     Recent Labs     02/07/21  1136 02/06/21  0345   NA 137 138   K 4.0 4.2   CL 109* 111*   CO2 23 22   GLU 193* 94   BUN 20 36*   CREA 0.91 0.91   CA 8.0* 8.0*   ALB 2.6*  --    TBILI 0.3  --    ALT 13  --    INR 1.1 1.1     No results found for: GLUCPOC  No results for input(s): PH, PCO2, PO2, HCO3, FIO2 in the last 72 hours.  Recent Labs     02/07/21  1136 02/06/21  0345   INR 1.1 1.1     All Micro Results     Procedure Component Value Units Date/Time    CULTURE, BLOOD, PAIRED [[id number]] Collected: 02/04/21 2222    Order Status: Completed Specimen: Blood Updated: 02/08/21 0618     Special Requests: NO SPECIAL REQUESTS        Culture result: NO GROWTH 4 DAYS       COVID-19 RAPID TEST [[id number]] Collected: 02/06/21 0950    Order Status: Completed Specimen: Nasopharyngeal Updated: 02/06/21 1016     Specimen source Nasopharyngeal        COVID-19 rapid test Not detected        Comment: Rapid Abbott ID Now       Rapid NAAT:  The specimen is NEGATIVE for SARS-CoV-2, the novel coronavirus associated with COVID-19.  Negative results should be treated as presumptive and, if inconsistent with clinical signs and symptoms or necessary for patient management, should be tested with an alternative molecular assay.  Negative results do not preclude SARS-CoV-2 infection and should not be used as the sole basis for patient management decisions.       This test has been authorized by the FDA under an Emergency Use  Authorization (EUA) for use by authorized laboratories.   Fact sheet for Healthcare Providers: FlickSafe.gl  Fact sheet for Patients: [place]       Methodology: Isothermal Nucleic Acid Amplification               Other pertinent lab: none    Total time spent with patient: 30 Minutes I personally reviewed chart, notes, data and current medications in the medical record.  I have personally examined and treated the patient at bedside during this period.                 Care Plan discussed with: Patient, Family, Care Manager, Nursing Staff and >50% of time spent in counseling and coordination of care    Discussed:  Care Plan and D/C Planning    Prophylaxis:  H2B/PPI    Disposition:  SNF/LTC and HH PT, OT, RN           ___________________________________________________    Attending Physician: Salvadore Dom, MD

## 2021-02-09 LAB — CBC W/O DIFF
ABSOLUTE NRBC: 0.02 10*3/uL — ABNORMAL HIGH (ref 0.00–0.01)
HCT: 27 % — ABNORMAL LOW (ref 35.0–47.0)
HGB: 8.7 g/dL — ABNORMAL LOW (ref 11.5–16.0)
MCH: 30.3 PG (ref 26.0–34.0)
MCHC: 32.2 g/dL (ref 30.0–36.5)
MCV: 94.1 FL (ref 80.0–99.0)
MPV: 8.8 FL — ABNORMAL LOW (ref 8.9–12.9)
NRBC: 0.3 PER 100 WBC — ABNORMAL HIGH
PLATELET: 210 10*3/uL (ref 150–400)
RBC: 2.87 M/uL — ABNORMAL LOW (ref 3.80–5.20)
RDW: 19.9 % — ABNORMAL HIGH (ref 11.5–14.5)
WBC: 7 10*3/uL (ref 3.6–11.0)

## 2021-02-09 LAB — CULTURE, BLOOD, PAIRED
Culture result:: NO GROWTH
Culture: NO GROWTH

## 2021-02-09 LAB — SAMPLES BEING HELD

## 2021-02-09 LAB — CBC
Hematocrit: 27 % — ABNORMAL LOW (ref 35.0–47.0)
Hemoglobin: 8.7 g/dL — ABNORMAL LOW (ref 11.5–16.0)
MCH: 30.3 PG (ref 26.0–34.0)
MCHC: 32.2 g/dL (ref 30.0–36.5)
MCV: 94.1 FL (ref 80.0–99.0)
MPV: 8.8 FL — ABNORMAL LOW (ref 8.9–12.9)
NRBC Absolute: 0.02 10*3/uL — ABNORMAL HIGH (ref 0.00–0.01)
Nucleated RBCs: 0.3 PER 100 WBC — ABNORMAL HIGH
Platelets: 210 10*3/uL (ref 150–400)
RBC: 2.87 M/uL — ABNORMAL LOW (ref 3.80–5.20)
RDW: 19.9 % — ABNORMAL HIGH (ref 11.5–14.5)
WBC: 7 10*3/uL (ref 3.6–11.0)

## 2021-02-09 MED ORDER — PROPOFOL 10 MG/ML IV EMUL
10 mg/mL | INTRAVENOUS | Status: DC | PRN
Start: 2021-02-09 — End: 2021-02-09
  Administered 2021-02-09: 19:00:00 via INTRAVENOUS

## 2021-02-09 MED ORDER — PHENYLEPHRINE IN 0.9 % SODIUM CL (40 MCG/ML) IV SYRINGE
0.4 mg/10 mL (40 mcg/mL) | INTRAVENOUS | Status: DC | PRN
Start: 2021-02-09 — End: 2021-02-09
  Administered 2021-02-09: 19:00:00 via INTRAVENOUS

## 2021-02-09 MED ORDER — SODIUM CHLORIDE 0.9 % IV
INTRAVENOUS | Status: AC
Start: 2021-02-09 — End: 2021-02-09
  Administered 2021-02-09: 22:00:00 via INTRAVENOUS

## 2021-02-09 MED ORDER — MIDAZOLAM 1 MG/ML IJ SOLN
1 mg/mL | INTRAMUSCULAR | Status: DC | PRN
Start: 2021-02-09 — End: 2021-02-09

## 2021-02-09 MED ORDER — SODIUM CHLORIDE 0.9 % IV
INTRAVENOUS | Status: DC | PRN
Start: 2021-02-09 — End: 2021-02-09
  Administered 2021-02-09: 19:00:00 via INTRAVENOUS

## 2021-02-09 MED ORDER — FLUMAZENIL 0.1 MG/ML IV SOLN
0.1 mg/mL | INTRAVENOUS | Status: DC | PRN
Start: 2021-02-09 — End: 2021-02-09

## 2021-02-09 MED ORDER — EPINEPHRINE 0.1 MG/ML SYRINGE
0.1 mg/mL | Freq: Once | INTRAMUSCULAR | Status: DC | PRN
Start: 2021-02-09 — End: 2021-02-09

## 2021-02-09 MED ORDER — NALOXONE 0.4 MG/ML INJECTION
0.4 mg/mL | INTRAMUSCULAR | Status: DC | PRN
Start: 2021-02-09 — End: 2021-02-09

## 2021-02-09 MED ORDER — LIDOCAINE (PF) 20 MG/ML (2 %) IV SYRINGE
100 mg/5 mL (2 %) | INTRAVENOUS | Status: AC
Start: 2021-02-09 — End: ?

## 2021-02-09 MED ORDER — ATROPINE 0.1 MG/ML SYRINGE
0.1 mg/mL | Freq: Once | INTRAMUSCULAR | Status: DC | PRN
Start: 2021-02-09 — End: 2021-02-09

## 2021-02-09 MED ORDER — SIMETHICONE 40 MG/0.6 ML ORAL DROPS, SUSP
40 mg/0.6 mL | ORAL | Status: DC | PRN
Start: 2021-02-09 — End: 2021-02-09

## 2021-02-09 MED ORDER — LIDOCAINE (PF) 20 MG/ML (2 %) IJ SOLN
20 mg/mL (2 %) | INTRAMUSCULAR | Status: DC | PRN
Start: 2021-02-09 — End: 2021-02-09
  Administered 2021-02-09: 19:00:00 via INTRAVENOUS

## 2021-02-09 MED FILL — MIDODRINE 5 MG TAB: 5 mg | ORAL | Qty: 1

## 2021-02-09 MED FILL — SERTRALINE 50 MG TAB: 50 mg | ORAL | Qty: 1

## 2021-02-09 MED FILL — AMITRIPTYLINE 50 MG TAB: 50 mg | ORAL | Qty: 1

## 2021-02-09 MED FILL — ALLOPURINOL 100 MG TAB: 100 mg | ORAL | Qty: 1

## 2021-02-09 MED FILL — PANTOPRAZOLE 40 MG TAB, DELAYED RELEASE: 40 mg | ORAL | Qty: 1

## 2021-02-09 MED FILL — LEVOTHYROXINE 50 MCG TAB: 50 mcg | ORAL | Qty: 1

## 2021-02-09 MED FILL — SODIUM CHLORIDE 0.9 % IV: INTRAVENOUS | Qty: 250

## 2021-02-09 MED FILL — LIDOCAINE (PF) 20 MG/ML (2 %) IV SYRINGE: 100 mg/5 mL (2 %) | INTRAVENOUS | Qty: 5

## 2021-02-09 MED FILL — LIDOCAINE 4 % TOPICAL PATCH (12 HOUR DURATION): 4 % | CUTANEOUS | Qty: 1

## 2021-02-09 MED FILL — ATENOLOL 25 MG TAB: 25 mg | ORAL | Qty: 1

## 2021-02-09 MED FILL — POLYETHYLENE GLYCOL 3350 17 GRAM (100 %) ORAL POWDER PACKET: 17 gram | ORAL | Qty: 1

## 2021-02-09 MED FILL — ATORVASTATIN 10 MG TAB: 10 mg | ORAL | Qty: 1

## 2021-02-09 NOTE — Interval H&P Note (Signed)
1230  TRANSFER - IN REPORT:    Verbal report received from Markus Daft, RN on Gretell Gelin  being received from 416(unit) for ordered procedure.     Report consisted of patient's Situation, Background, Assessment and   Recommendations(SBAR).     Information from the following report(s) SBAR and Procedure Summary was reviewed with the receiving nurse.    Opportunity for questions and clarification was provided.      Assessment completed upon patient's arrival to unit and care assumed.     1347  Timeout performed.  Anesthesia staff at patient's bedside administering anesthesia and monitoring patients vital signs throughout procedure. See anesthesia note. Post procedure, report received from CRNA,   Esperanza Heir.    1410  Endoscope was pre-cleaned at bedside immediately following procedure by endo tech,    Ayesha Rumpf.    1414  Patient tolerated procedure.   Abdomen soft and patient arousable and voices no complaints.   Patient transported to endoscopy recovery area.   Report given to post procedure RN,   Emelia Salisbury.    1440  TRANSFER - OUT REPORT:    Verbal report given to Marliss Coots, RN on Chane Gerrits  being transferred to 416 for routine progression of care       Report consisted of patient's Situation, Background, Assessment and   Recommendations(SBAR).     Information from the following report(s) SBAR and Procedure Summary was reviewed with the receiving nurse.    Lines:   Peripheral IV 02/09/21 Right;Distal Forearm (Active)        Opportunity for questions and clarification was provided.      Patient transported with:   Pt chart  New, patent IV.

## 2021-02-09 NOTE — Progress Notes (Signed)
[facility]    Medical Progress Note      NAME: Savannah Irwin   DOB:  1946-08-29  MRM:  [id number]    Date/Time of service 02/09/2021  10:34 AM          Assessment and Plan:     Acute blood loss anemia - POA, now stable. Serologies normal, suggesting no long term bleeding or deficiencies    Upper GI bleed - POA.  Appreciate GI consult.  GI found non bleeding duodenal ulcers.  PPI BID.  Stop bisphosphanate.  GI plans colonoscopy this afternoon.    Hemorrhagic shock / Sinus tachycardia - POA, stable today.  Continue midodrine, but I resumed lowest dose atenolol to avoid rebound tachycardia.  Stopped lasix. She states she typically has low BP.  Goal MAP 60.    Type 2 diabetes mellitus - Diabetic diet and counseling.  SSI per protocol.  Not on home meds. A1c useless in setting of bleed.    Pulmonary hypertension / Heart failure with preserved ejection fraction - Appears stable. EF stable, but still pHTN on ECHO.  Holding lasix due to low BP    Graves' disease / hypothyroidism - POA, TSH a bit elevated, resume synthroid    Gout - Continue allopurinol.    Emphysema lung - Outpatient pulmonary follow up.  Not on meds    Hyperlipidemia - Continue atorvastatin    Anxiety and depression / Fibromyalgia - Continue sertralilne and amitriptyline    Underweight / Hypoalbuminemia - POA, chronic, would benefit from supplements.    Gastroenteritis / Leukocytosis - Procalcitonin Negative. Cx negative.  Likely viral vs food.  Stop Abx.         Subjective:     Chief Complaint:  Tolerating GI prep, feels weak    ROS:  (bold if positive, if negative)    Tolerating some PT  NPO        Objective:     Last 24hrs VS reviewed since prior progress note. Most recent are:    Visit Vitals  BP (!) 99/57 (BP 1 Location: Right upper arm, BP Patient Position: At rest;Lying)   Pulse 95   Temp 99.2 ??F (37.3 ??C)   Resp 20   Ht 5\' 3"  (1.6 m)   Wt 52.8 kg (116 lb 6.4 oz)   SpO2 96%   BMI 20.62 kg/m??     SpO2 Readings from Last 6 Encounters:    02/09/21 96%    O2 Flow Rate (L/min): 3 l/min       Intake/Output Summary (Last 24 hours) at 02/09/2021 1034  Last data filed at 02/09/2021 0427  Gross per 24 hour   Intake 0 ml   Output 250 ml   Net -250 ml        Physical Exam:    Gen:  Thin, in no acute distress  HEENT:  Pink conjunctivae, PERRL, hearing intact to voice, moist mucous membranes  Neck:  Supple, without masses, thyroid non-tender  Resp:  No accessory muscle use, clear breath sounds without wheezes rales or rhonchi  Card:  No murmurs, tachycardic S1, S2 without thrills, bruits or peripheral edema  Abd:  Soft, non-tender, non-distended, normoactive bowel sounds are present, no mass  Lymph:  No cervical or inguinal adenopathy  Musc:  No cyanosis or clubbing  Skin:  No rashes or ulcers, skin turgor is good  Neuro:  Cranial nerves are grossly intact, general motor weakness, follows commands   Psych:  Poor insight, oriented to person,  place and time, alert    Telemetry reviewed:   normal sinus rhythm  __________________________________________________________________  Medications Reviewed: (see below)  Medications:     Current Facility-Administered Medications   Medication Dose Route Frequency   ??? levothyroxine (SYNTHROID) tablet 50 mcg  50 mcg Oral ACB   ??? midodrine (PROAMATINE) tablet 5 mg  5 mg Oral TID WITH MEALS   ??? pantoprazole (PROTONIX) tablet 40 mg  40 mg Oral ACB&D   ??? sertraline (ZOLOFT) tablet 50 mg  50 mg Oral DAILY   ??? atorvastatin (LIPITOR) tablet 10 mg  10 mg Oral DAILY   ??? atenoloL (TENORMIN) tablet 25 mg  25 mg Oral DAILY   ??? amitriptyline (ELAVIL) tablet 50 mg  50 mg Oral QHS   ??? allopurinoL (ZYLOPRIM) tablet 100 mg  100 mg Oral DAILY   ??? sodium chloride (NS) flush 5-40 mL  5-40 mL IntraVENous Q8H   ??? sodium chloride (NS) flush 5-40 mL  5-40 mL IntraVENous PRN   ??? acetaminophen (TYLENOL) tablet 650 mg  650 mg Oral Q6H PRN    Or   ??? acetaminophen (TYLENOL) suppository 650 mg  650 mg Rectal Q6H PRN   ??? polyethylene glycol (MIRALAX) packet  17 g  17 g Oral DAILY PRN   ??? ondansetron (ZOFRAN) injection 4 mg  4 mg IntraVENous Q4H PRN   ??? morphine injection 2 mg  2 mg IntraVENous Q3H PRN   ??? lidocaine 4 % patch 1 Patch  1 Patch TransDERmal Q24H   ??? 0.9% sodium chloride infusion 250 mL  250 mL IntraVENous PRN   ??? 0.9% sodium chloride infusion 250 mL  250 mL IntraVENous PRN        Lab Data Reviewed: (see below)  Lab Review:     Recent Labs     02/09/21  0404 02/08/21  0600 02/07/21  1136   WBC 7.0 7.0 8.1   HGB 8.7* 8.1* 7.7*   HCT 27.0* 24.9* 23.7*   PLT 210 201 206     Recent Labs     02/07/21  1136   NA 137   K 4.0   CL 109*   CO2 23   GLU 193*   BUN 20   CREA 0.91   CA 8.0*   ALB 2.6*   TBILI 0.3   ALT 13   INR 1.1     No results found for: GLUCPOC  No results for input(s): PH, PCO2, PO2, HCO3, FIO2 in the last 72 hours.  Recent Labs     02/07/21  1136   INR 1.1     All Micro Results     Procedure Component Value Units Date/Time    CULTURE, BLOOD, PAIRED [[id number]] Collected: 02/04/21 2222    Order Status: Completed Specimen: Blood Updated: 02/09/21 0608     Special Requests: NO SPECIAL REQUESTS        Culture result: NO GROWTH 5 DAYS       COVID-19 RAPID TEST [[id number]] Collected: 02/06/21 0950    Order Status: Completed Specimen: Nasopharyngeal Updated: 02/06/21 1016     Specimen source Nasopharyngeal        COVID-19 rapid test Not detected        Comment: Rapid Abbott ID Now       Rapid NAAT:  The specimen is NEGATIVE for SARS-CoV-2, the novel coronavirus associated with COVID-19.       Negative results should be treated as presumptive and, if inconsistent with clinical signs and symptoms  or necessary for patient management, should be tested with an alternative molecular assay.  Negative results do not preclude SARS-CoV-2 infection and should not be used as the sole basis for patient management decisions.       This test has been authorized by the FDA under an Emergency Use Authorization (EUA) for use by authorized laboratories.   Fact sheet for  Healthcare Providers: FlickSafe.gl  Fact sheet for Patients: [place]at       Methodology: Isothermal Nucleic Acid Amplification               Other pertinent lab: none    Total time spent with patient: 30 Minutes I personally reviewed chart, notes, data and current medications in the medical record.  I have personally examined and treated the patient at bedside during this period.                 Care Plan discussed with: Patient, Family, Care Manager, Nursing Staff and >50% of time spent in counseling and coordination of care    Discussed:  Care Plan and D/C Planning    Prophylaxis:  H2B/PPI    Disposition:  SNF/LTC and HH PT, OT, RN           ___________________________________________________    Attending Physician: Salvadore Dom, MD

## 2021-02-09 NOTE — Progress Notes (Signed)
Savannah Irwin  Apr 23, 1946  [id number]    Situation:  Verbal report received from: Colman Cater  Procedure: Procedure(s):  COLONOSCOPY  ENDOSCOPIC POLYPECTOMY    Background:    Preoperative diagnosis: anemia  Postoperative diagnosis: Hemorrhoids, 4 colon polyps removed    Operator:  Dr. Monika Salk  Assistant(s): Endoscopy Technician-1: Ayesha Rumpf  Endoscopy RN-1: Kendrick Ranch, RN    Specimens:   ID Type Source Tests Collected by Time Destination   1 : transverse polyps Preservative Colon, Transverse  Arman Filter, MD 02/09/2021 1355 Pathology   2 : cecum polyp Preservative Cecum  Arman Filter, MD 02/09/2021 1402 Pathology     H. Pylori  no    Assessment:  Intra-procedure medications     Anesthesia gave intra-procedure sedation and medications, see anesthesia flow sheet yes    Intravenous fluids: NS@ KVO     Vital signs stable yes    Abdominal assessment: round and soft yes    Recommendation:  Discharge patient per MD order yes.  Return to floor na  Family or Friend na  Permission to share finding with family or friend yes and n/a

## 2021-02-09 NOTE — Progress Notes (Signed)
Patient denies any pain.

## 2021-02-09 NOTE — Progress Notes (Signed)
Dr. Eid discussed with patient procedure findings and next steps.

## 2021-02-09 NOTE — Progress Notes (Signed)
Occupational Therapy Note: Pt off the floor for an EGD. Will cont to follow.

## 2021-02-09 NOTE — Interval H&P Note (Signed)
Savannah Irwin  05-19-1946  [id number]    Situation:    Scheduled Procedure: Procedure(s):  ESOPHAGOGASTRODUODENOSCOPY (EGD)  Verbal report received from: Chanetta Marshall, RN  Preoperative diagnosis: melena    Background:    Procedure: Procedure(s):  ESOPHAGOGASTRODUODENOSCOPY (EGD)  Physician performing procedure; Dr. Eid/Dr. Ruffin Frederick    SBAR QUESTIONS FLOOR TO ENDO RN    NPO Status/Last PO Intake: midnight except sips of water with meds    Pregnancy Test:Not applicable If yes, result: none    Is the patient taking Blood Thinners: YES If yes, list: Eliquis and last taken prior to admission on 02/06/2021  Is the patient diabetic:yes   No recent blood glucose checked  Does the patient have a Pacemaker/Defibrillator in place?: no   Does the patient need antibiotics before/during/after procedure: no   If the patient is having a colon, How much prep was drank? all   What were the Colon prep results? Clear/liquid per report   Does the patient have SCD in place:no   Is patient on CONTACT precautions:no        Assessment:  Are the vital signs stable prior to patient coming to ENDO?  yes  Is the patient alert/oriented and able to sign consent for the procedures:yes    Does the patient have a patient IV in place? yes     Recommendation:  Family or Friend present no     Permission to share finding with Family or Friend n/a

## 2021-02-09 NOTE — Progress Notes (Signed)
Physical Therapy Note:  Attempted to see patient who is currently OTF for EGD. Will defer and follow up as able once she returns.  Thank you,  Cathie Olden, PT, DPT

## 2021-02-09 NOTE — Progress Notes (Signed)
02/09/21    Medicare pt has received, reviewed, and signed 2nd IM letter informing them of their right to appeal the discharge.  Signed copied has been placed on pt bedside chart.

## 2021-02-09 NOTE — Procedures (Signed)
Procedures by Rhodia Albright, MD at 02/09/21 1412                Author: Rhodia Albright, MD  Service: Gastroenterology  Author Type: Physician       Filed: 02/09/21 1420  Date of Service: 02/09/21 1412  Status: Signed          Editor: Rhodia Albright, MD (Physician)            Pre-procedure Diagnoses        1. Hematochezia [K92.1]                           Post-procedure Diagnoses        1. 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 Hemorrhoids without complication [Z61.0]                           Procedures        1. [name] [[id number]]                              [facility]   Deno Lunger, M.D.   [phone]                02/09/2021            Colonoscopy Operative Report   Savannah Irwin   DOB:  [birth year]   [facility] Medical Record Number:  [id number]         Indications:    Hematochezia/melena       Operator:  Rhodia Albright, MD      Referring Provider: None      Sedation:  MAC anesthesia      Pre-Procedural Exam:        Airway: clear,  No airway problems anticipated   Heart: RRR, without gallops or rubs   Lungs: clear bilaterally without wheezes, crackles, or rhonchi   Abdomen: soft, nontender, nondistended, bowel sounds present   Mental Status: awake, alert and oriented to person, place and time       Procedure Details:  After informed consent was obtained with all risks and benefits of procedure explained and preoperative exam completed,  the patient was taken to the endoscopy suite and placed in the left lateral decubitus position.  Upon sequential sedation as per above, a digital rectal exam was performed. The Olympus videocolonoscope  was inserted in the rectum and carefully advanced  to the cecum, which was identified by the ileocecal valve and appendiceal orifice, terminal ileum.  The quality of preparation was good.  The  colonoscope was slowly withdrawn with careful inspection and evaluation between folds.  Retroflexion in the rectum was performed.      Findings:    Terminal Ileum: normal   Cecum: 1  Sessile polyp(s), the largest 4 mm in size;   Ascending Colon: normal   Transverse Colon: 3  Sessile polyp(s), the largest 8 mm in size;   Descending Colon: normal   Sigmoid: normal   Rectum: no mucosal lesion appreciated   Grade 1 internal hemorrhoid(s);      Interventions:  4 complete polypectomy were performed using hot snare  and cold snare and the polyps were  retrieved      Specimen  Removed:  specimen #1, 3, 4 and 8 mm in size, located in the transverse colon removed by snare cautery and retrieved for pathology   #2, 4 mm in size, located in the cecum removed by snare cautery and retrieved for pathology      Complications: None.       EBL:  None.      Impression:  A total of 4 polyps were removed and sent to pathology, otherwise mucosa within normal.                           Small internal hemorrhoids      Recommendations:   -Await pathology. Doubt need for surveillance colonoscopy in the future based on age.   -High fiber diet.     -Continue to monitor hemoglobin   -Can resume oral anti-coagulation next week from GI standpoint and if no recurrent bleeding.       Discharge Disposition:  Home in the company of a driver when able to ambulate.      Rhodia Albright, MD   02/09/2021  2:15 PM

## 2021-02-09 NOTE — Anesthesia Pre-Procedure Evaluation (Signed)
Relevant Problems   RESPIRATORY SYSTEM   (+) Emphysema lung (HCC)      NEUROLOGY   (+) Anxiety and depression      ENDOCRINE   (+) Acquired hypothyroidism   (+) Type 2 diabetes mellitus (HCC)      HEMATOLOGY   (+) Acute blood loss anemia       Anesthetic History   No history of anesthetic complications            Review of Systems / Medical History  Patient summary reviewed, nursing notes reviewed and pertinent labs reviewed    Pulmonary    COPD      Smoker         Neuro/Psych   Within defined limits           Cardiovascular  Within defined limits                Exercise tolerance: >4 METS     GI/Hepatic/Renal           PUD     Endo/Other    Diabetes  Hypothyroidism       Other Findings   Comments: Fibromyalgia  Emphysema  Pulmonary hypertension  GI bleed           Physical Exam    Airway  Mallampati: II    Neck ROM: normal range of motion   Mouth opening: Normal     Cardiovascular  Regular rate and rhythm,  S1 and S2 normal,  no murmur, click, rub, or gallop  Rhythm: regular  Rate: normal         Dental  No notable dental hx       Pulmonary  Breath sounds clear to auscultation               Abdominal  GI exam deferred       Other Findings            Anesthetic Plan    ASA: 3  Anesthesia type: MAC          Induction: Intravenous  Anesthetic plan and risks discussed with: Patient

## 2021-02-10 LAB — VITAMIN B1: Vitamin B1 (Thiamine): 60.6 nmol/L — ABNORMAL LOW (ref 66.5–200.0)

## 2021-02-10 MED ORDER — CALCIUM CARBONATE 500 MG (1250 MG) TAB
500 mg calcium (1,250 mg) | ORAL_TABLET | Freq: Three times a day (TID) | ORAL | 0 refills | Status: AC
Start: 2021-02-10 — End: 2021-03-12

## 2021-02-10 MED ORDER — MIDODRINE 5 MG TAB
5 mg | ORAL_TABLET | Freq: Three times a day (TID) | ORAL | 0 refills | Status: AC
Start: 2021-02-10 — End: 2021-03-12

## 2021-02-10 MED FILL — ACETAMINOPHEN 325 MG TABLET: 325 mg | ORAL | Qty: 2

## 2021-02-10 MED FILL — ATORVASTATIN 10 MG TAB: 10 mg | ORAL | Qty: 1

## 2021-02-10 MED FILL — MIDODRINE 5 MG TAB: 5 mg | ORAL | Qty: 1

## 2021-02-10 MED FILL — LIDOCAINE 4 % TOPICAL PATCH (12 HOUR DURATION): 4 % | CUTANEOUS | Qty: 1

## 2021-02-10 MED FILL — ATENOLOL 25 MG TAB: 25 mg | ORAL | Qty: 1

## 2021-02-10 MED FILL — PANTOPRAZOLE 40 MG TAB, DELAYED RELEASE: 40 mg | ORAL | Qty: 1

## 2021-02-10 MED FILL — AMITRIPTYLINE 50 MG TAB: 50 mg | ORAL | Qty: 1

## 2021-02-10 MED FILL — LEVOTHYROXINE 50 MCG TAB: 50 mcg | ORAL | Qty: 1

## 2021-02-10 MED FILL — ALLOPURINOL 100 MG TAB: 100 mg | ORAL | Qty: 1

## 2021-02-10 MED FILL — SERTRALINE 50 MG TAB: 50 mg | ORAL | Qty: 1

## 2021-02-10 NOTE — Discharge Summary (Signed)
Physician Discharge Summary     Patient ID:  Savannah Irwin  [id number]  75 y.o.  04-08-1946    Admit date: 02/04/2021    Discharge date of service and time: 02/10/2021    Admission Diagnoses: Hemorrhagic shock (HCC) [R57.8]  Acute blood loss anemia [D62]    Discharge Diagnoses:    Principal Diagnosis   Acute blood loss anemia                                             Hospital Course and other diagnoses  Acute blood loss anemia - POA, now stable. Serologies normal, suggesting no long term bleeding or deficiencies  ??  Upper GI bleed - POA.  Appreciate GI consult.  GI found non bleeding duodenal ulcers. Colonoscopy with bland polyps.  PPI BID.  Stop bisphosphanate.   ??  Hemorrhagic shock / Sinus tachycardia - POA, stable today.  Continue midodrine, but I resumed lowest dose atenolol, to avoid rebound tachycardia.  Stopped lasix. She states she typically has low BP.  Goal MAP 60.  ??  Type 2 diabetes mellitus - Diabetic diet and counseling.  SSI per protocol.  Not on home meds. A1c useless in setting of bleed.  ??  Pulmonary hypertension / Heart failure with preserved ejection fraction - Appears stable. EF stable, but still pHTN on ECHO.  Stop lasix due to low BP. Stop Eliquis due to bleeding  ??  Graves' disease / hypothyroidism - POA, TSH a bit elevated, resume synthroid  ??  Gout - Continue allopurinol.  ??  Emphysema lung - Outpatient pulmonary follow up.  Not on meds  ??  Hyperlipidemia - Continue atorvastatin  ??  Anxiety and depression / Fibromyalgia - Continue sertralilne and amitriptyline  ??  Underweight / Hypoalbuminemia - POA, chronic, would benefit from supplements.  ??  Gastroenteritis / Leukocytosis - Procalcitonin Negative. Cx negative.  Likely viral vs food.  Stop Abx.  ??  Debilitated patient - Arrange HH PT OT  ??    PCP: None    Consults: GI    Significant Diagnostic Studies: See Hospital Course    Discharged home in improved condition.    Discharge Exam:  BP 110/68 (BP 1 Location: Right upper arm, BP Patient  Position: Semi fowlers)   Pulse 94   Temp 98.4 ??F (36.9 ??C)   Resp 17   Ht 5\' 3"  (1.6 m)   Wt 52.8 kg (116 lb 6.4 oz)   SpO2 97%   BMI 20.62 kg/m??   ??  Gen:  Thin, in no acute distress  HEENT:  Pink conjunctivae, PERRL, hearing intact to voice, moist mucous membranes  Neck:  Supple, without masses, thyroid non-tender  Resp:  No accessory muscle use, clear breath sounds without wheezes rales or rhonchi  Card:  No murmurs, tachycardic S1, S2 without thrills, bruits or peripheral edema  Abd:  Soft, non-tender, non-distended, normoactive bowel sounds are present, no mass  Lymph:  No cervical or inguinal adenopathy  Musc:  No cyanosis or clubbing  Skin:  No rashes or ulcers, skin turgor is good  Neuro:  Cranial nerves are grossly intact, general motor weakness, follows commands   Psych:  Poor insight, oriented to person, place and time, alert    Patient Instructions:   Current Discharge Medication List      START taking these medications    Details  midodrine (PROAMATINE) 5 mg tablet Take 1 Tablet by mouth three (3) times daily (with meals) for 30 days.  Qty: 90 Tablet, Refills: 0         CONTINUE these medications which have CHANGED    Details   calcium carbonate (OS-CAL) 500 mg calcium (1,250 mg) tablet Take 1 Tablet by mouth three (3) times daily for 30 days.  Qty: 90 Tablet, Refills: 0         CONTINUE these medications which have NOT CHANGED    Details   levothyroxine (SYNTHROID) 50 mcg tablet Take 50 mcg by mouth Daily (before breakfast).      pantoprazole (PROTONIX) 40 mg tablet Take 40 mg by mouth daily.      sertraline (ZOLOFT) 50 mg tablet Take 50 mg by mouth daily.      sodium chloride 1 gram tablet Take 1 g by mouth daily. Take one tablet by mouth daily for 7 days      ergocalciferol (Vitamin D2) 1,250 mcg (50,000 unit) capsule Take 50,000 Units by mouth every seven (7) days.      atorvastatin (LIPITOR) 10 mg tablet Take 10 mg by mouth daily.      azelastine-fluticasone 137-50 mcg/spray spry 2 Sprays by  Nasal route two (2) times a day.      clotrimazole (Lotrimin AF, clotrimazole,) 1 % topical cream Apply  to affected area two (2) times a day.      ipratropium (ATROVENT HFA) 17 mcg/actuation inhaler Take 2 Puffs by inhalation every six (6) hours as needed for Wheezing.      allopurinoL (ZYLOPRIM) 100 mg tablet Take 100 mg by mouth daily.      amitriptyline (ELAVIL) 50 mg tablet Take 50 mg by mouth nightly.      atenoloL (TENORMIN) 25 mg tablet Take 25 mg by mouth daily.         STOP taking these medications       apixaban (ELIQUIS) 5 mg tablet Comments:   Reason for Stopping:         alendronate (FOSAMAX) 70 mg tablet Comments:   Reason for Stopping:             Activity: Activity as tolerated and PT/OT per Home Health  Diet: Cardiac Diet and Diabetic Diet  Wound Care: None needed    Follow-up with your PCP in a few weeks.  Follow-up tests/labs - none    Signed:  Salvadore Dom, MD  02/10/2021  9:37 AM

## 2021-02-10 NOTE — Progress Notes (Signed)
[facility]    Medical Progress Note      NAME: Savannah Irwin   DOB:  [birth year]  MRM:  [id number]    Date/Time of service 02/10/2021  9:32 AM          Assessment and Plan:     Acute blood loss anemia - POA, now stable. Serologies normal, suggesting no long term bleeding or deficiencies    Upper GI bleed - POA.  Appreciate GI consult.  GI found non bleeding duodenal ulcers. Colonoscopy with bland polyps.  PPI BID.  Stop bisphosphanate.     Hemorrhagic shock / Sinus tachycardia - POA, stable today.  Continue midodrine, but I resumed lowest dose atenolol, to avoid rebound tachycardia.  Stopped lasix. She states she typically has low BP.  Goal MAP 60.    Type 2 diabetes mellitus - Diabetic diet and counseling.  SSI per protocol.  Not on home meds. A1c useless in setting of bleed.    Pulmonary hypertension / Heart failure with preserved ejection fraction - Appears stable. EF stable, but still pHTN on ECHO.  Stop lasix due to low BP    Graves' disease / hypothyroidism - POA, TSH a bit elevated, resume synthroid    Gout - Continue allopurinol.    Emphysema lung - Outpatient pulmonary follow up.  Not on meds    Hyperlipidemia - Continue atorvastatin    Anxiety and depression / Fibromyalgia - Continue sertralilne and amitriptyline    Underweight / Hypoalbuminemia - POA, chronic, would benefit from supplements.    Gastroenteritis / Leukocytosis - Procalcitonin Negative. Cx negative.  Likely viral vs food.  Stop Abx.    Debilitated patient - Arrange HH PT OT       Subjective:     Chief Complaint:  Still weak    ROS:  (bold if positive, if negative)    Tolerating some PT  tolerating diet        Objective:     Last 24hrs VS reviewed since prior progress note. Most recent are:    Visit Vitals  BP 110/68 (BP 1 Location: Right upper arm, BP Patient Position: Semi fowlers)   Pulse 94   Temp 98.4 ??F (36.9 ??C)   Resp 17   Ht 5\' 3"  (1.6 m)   Wt 52.8 kg (116 lb 6.4 oz)   SpO2 97%   BMI 20.62 kg/m??     SpO2 Readings from  Last 6 Encounters:   02/10/21 97%    O2 Flow Rate (L/min): 3 l/min       Intake/Output Summary (Last 24 hours) at 02/10/2021 0932  Last data filed at 02/09/2021 1413  Gross per 24 hour   Intake 100 ml   Output ???   Net 100 ml        Physical Exam:    Gen:  Thin, in no acute distress  HEENT:  Pink conjunctivae, PERRL, hearing intact to voice, moist mucous membranes  Neck:  Supple, without masses, thyroid non-tender  Resp:  No accessory muscle use, clear breath sounds without wheezes rales or rhonchi  Card:  No murmurs, tachycardic S1, S2 without thrills, bruits or peripheral edema  Abd:  Soft, non-tender, non-distended, normoactive bowel sounds are present, no mass  Lymph:  No cervical or inguinal adenopathy  Musc:  No cyanosis or clubbing  Skin:  No rashes or ulcers, skin turgor is good  Neuro:  Cranial nerves are grossly intact, general motor weakness, follows commands   Psych:  Poor insight,  oriented to person, place and time, alert    Telemetry reviewed:   normal sinus rhythm  __________________________________________________________________  Medications Reviewed: (see below)  Medications:     Current Facility-Administered Medications   Medication Dose Route Frequency   ??? levothyroxine (SYNTHROID) tablet 50 mcg  50 mcg Oral ACB   ??? midodrine (PROAMATINE) tablet 5 mg  5 mg Oral TID WITH MEALS   ??? pantoprazole (PROTONIX) tablet 40 mg  40 mg Oral ACB&D   ??? sertraline (ZOLOFT) tablet 50 mg  50 mg Oral DAILY   ??? atorvastatin (LIPITOR) tablet 10 mg  10 mg Oral DAILY   ??? atenoloL (TENORMIN) tablet 25 mg  25 mg Oral DAILY   ??? amitriptyline (ELAVIL) tablet 50 mg  50 mg Oral QHS   ??? allopurinoL (ZYLOPRIM) tablet 100 mg  100 mg Oral DAILY   ??? sodium chloride (NS) flush 5-40 mL  5-40 mL IntraVENous Q8H   ??? sodium chloride (NS) flush 5-40 mL  5-40 mL IntraVENous PRN   ??? acetaminophen (TYLENOL) tablet 650 mg  650 mg Oral Q6H PRN    Or   ??? acetaminophen (TYLENOL) suppository 650 mg  650 mg Rectal Q6H PRN   ??? polyethylene glycol  (MIRALAX) packet 17 g  17 g Oral DAILY PRN   ??? ondansetron (ZOFRAN) injection 4 mg  4 mg IntraVENous Q4H PRN   ??? morphine injection 2 mg  2 mg IntraVENous Q3H PRN   ??? lidocaine 4 % patch 1 Patch  1 Patch TransDERmal Q24H   ??? 0.9% sodium chloride infusion 250 mL  250 mL IntraVENous PRN   ??? 0.9% sodium chloride infusion 250 mL  250 mL IntraVENous PRN        Lab Data Reviewed: (see below)  Lab Review:     Recent Labs     02/09/21  0404 02/08/21  0600 02/07/21  1136   WBC 7.0 7.0 8.1   HGB 8.7* 8.1* 7.7*   HCT 27.0* 24.9* 23.7*   PLT 210 201 206     Recent Labs     02/07/21  1136   NA 137   K 4.0   CL 109*   CO2 23   GLU 193*   BUN 20   CREA 0.91   CA 8.0*   ALB 2.6*   TBILI 0.3   ALT 13   INR 1.1     No results found for: GLUCPOC  No results for input(s): PH, PCO2, PO2, HCO3, FIO2 in the last 72 hours.  Recent Labs     02/07/21  1136   INR 1.1     All Micro Results     Procedure Component Value Units Date/Time    CULTURE, BLOOD, PAIRED [[id number]] Collected: 02/04/21 2222    Order Status: Completed Specimen: Blood Updated: 02/09/21 0608     Special Requests: NO SPECIAL REQUESTS        Culture result: NO GROWTH 5 DAYS       COVID-19 RAPID TEST [[id number]] Collected: 02/06/21 0950    Order Status: Completed Specimen: Nasopharyngeal Updated: 02/06/21 1016     Specimen source Nasopharyngeal        COVID-19 rapid test Not detected        Comment: Rapid Abbott ID Now       Rapid NAAT:  The specimen is NEGATIVE for SARS-CoV-2, the novel coronavirus associated with COVID-19.       Negative results should be treated as presumptive and, if inconsistent with clinical  signs and symptoms or necessary for patient management, should be tested with an alternative molecular assay.  Negative results do not preclude SARS-CoV-2 infection and should not be used as the sole basis for patient management decisions.       This test has been authorized by the FDA under an Emergency Use Authorization (EUA) for use by authorized laboratories.    Fact sheet for Healthcare Providers: FlickSafe.gl  Fact sheet for Patients: [place].at       Methodology: Isothermal Nucleic Acid Amplification               Other pertinent lab: none    Total time spent with patient: 30 Minutes I personally reviewed chart, notes, data and current medications in the medical record.  I have personally examined and treated the patient at bedside during this period.                 Care Plan discussed with: Patient, Family, Care Manager, Nursing Staff and >50% of time spent in counseling and coordination of care    Discussed:  Care Plan and D/C Planning    Prophylaxis:  H2B/PPI    Disposition:  SNF/LTC and HH PT, OT, RN           ___________________________________________________    Attending Physician: Salvadore Dom, MD

## 2021-02-10 NOTE — Progress Notes (Signed)
I have reviewed discharge instructions with the patient and niece.  The patient and neice verbalized understanding.    Discharge medications reviewed with patient and neice and appropriate educational materials and side effects teaching were provided.      Discharge Medication List as of 02/10/2021 10:45 AM      START taking these medications    Details   midodrine (PROAMATINE) 5 mg tablet Take 1 Tablet by mouth three (3) times daily (with meals) for 30 days., Print, Disp-90 Tablet, R-0         CONTINUE these medications which have CHANGED    Details   calcium carbonate (OS-CAL) 500 mg calcium (1,250 mg) tablet Take 1 Tablet by mouth three (3) times daily for 30 days., No Print, Disp-90 Tablet, R-0         CONTINUE these medications which have NOT CHANGED    Details   levothyroxine (SYNTHROID) 50 mcg tablet Take 50 mcg by mouth Daily (before breakfast)., Historical Med      pantoprazole (PROTONIX) 40 mg tablet Take 40 mg by mouth daily., Historical Med      sertraline (ZOLOFT) 50 mg tablet Take 50 mg by mouth daily., Historical Med      sodium chloride 1 gram tablet Take 1 g by mouth daily. Take one tablet by mouth daily for 7 days, Historical Med      ergocalciferol (Vitamin D2) 1,250 mcg (50,000 unit) capsule Take 50,000 Units by mouth every seven (7) days., Historical Med      atorvastatin (LIPITOR) 10 mg tablet Take 10 mg by mouth daily., Historical Med      azelastine-fluticasone 137-50 mcg/spray spry 2 Sprays by Nasal route two (2) times a day., Historical Med      clotrimazole (Lotrimin AF, clotrimazole,) 1 % topical cream Apply  to affected area two (2) times a day., Historical Med      ipratropium (ATROVENT HFA) 17 mcg/actuation inhaler Take 2 Puffs by inhalation every six (6) hours as needed for Wheezing., Historical Med      allopurinoL (ZYLOPRIM) 100 mg tablet Take 100 mg by mouth daily., Historical Med      amitriptyline (ELAVIL) 50 mg tablet Take 50 mg by mouth nightly., Historical Med      atenoloL  (TENORMIN) 25 mg tablet Take 25 mg by mouth daily., Historical Med         STOP taking these medications       alendronate (FOSAMAX) 70 mg tablet Comments:   Reason for Stopping:         apixaban (ELIQUIS) 5 mg tablet Comments:   Reason for Stopping:

## 2021-02-10 NOTE — Progress Notes (Signed)
02/10/2021     1:05 PM  CM consult for rollator noted. Referral submitted to [facility]. Pt's niece will call and follow-up on referral post DC, and pick up rollator if it is approved.     10:47 AM  Care Management Progress Note      ICD-10-CM ICD-9-CM    1. Hemorrhagic shock (HCC)  R57.8 785.59    2. Severe anemia  D64.9 285.9    3. Upper GI bleed  K92.2 578.9        RUR:  16%  Risk Level: [] Low [x] Moderate [] High  Value-based purchasing: []  Yes [x]  No  Bundle patient: []  Yes [x]  No   Specify:     Transition of care plan:  1. Discharge order submitted. Pt has medically cleared.   2. Home with HH with [facility] and DC clinicals attached in [employer]. Pt's family to provide assistance as needed.   3. Outpatient follow-up.  4. Pt's family to transport.    Care Management Interventions  Support Systems: Other Family Member(s)  Discharge Location  Patient Expects to be Discharged to:: Home with home health

## 2021-02-10 NOTE — Progress Notes (Signed)
 Problem: Mobility Impaired (Adult and Pediatric)  Goal: *Acute Goals and Plan of Care (Insert Text)  Description: FUNCTIONAL STATUS PRIOR TO ADMISSION: Patient was modified independent using a single point cane for functional mobility.    HOME SUPPORT PRIOR TO ADMISSION: Pt was staying with niece in niece's apartment    Physical Therapy Goals  Initiated 02/08/2021  1.  Patient will move from supine to sit and sit to supine  in bed with modified independence within 7 day(s).    2.  Patient will transfer from bed to chair and chair to bed with supervision/set-up using the least restrictive device within 7 day(s).  3.  Patient will perform sit to stand with supervision/set-up within 7 day(s).  4.  Patient will ambulate with supervision/set-up for 150 feet with the least restrictive device within 7 day(s).   5.  Patient will ascend/descend 4 stairs with one handrail(s) with supervision/set-up within 7 day(s).    Note:   PHYSICAL THERAPY TREATMENT  Patient: Savannah Irwin (75 y.o. female)  Date: 02/10/2021  Diagnosis: Hemorrhagic shock (HCC) [R57.8]  Acute blood loss anemia [D62] Acute blood loss anemia  Procedure(s) (LRB):  COLONOSCOPY (N/A)  ENDOSCOPIC POLYPECTOMY (N/A) 1 Day Post-Op  Precautions: Fall,Skin  Chart, physical therapy assessment, plan of care and goals were reviewed.    ASSESSMENT  Patient continues with skilled PT services and is progressing towards goals. Patient reports feeling much improved and agreed to amb. Patient amb 10 with RW and niece requesting rollator for patient. Spoke with CM. Make suggestions for rollator and transport wheelchair for patient and entered orders. Spoke with CM. Patient has to amb up 6 steps then platform and then another 6 steps to second story bedroom. Patient cleared for discharge home with family.      Current Level of Function Impacting Discharge (mobility/balance): Mod I to supervision for ADLs and Mobility using RW    Other factors to consider for discharge:          PLAN  :  Patient continues to benefit from skilled intervention to address the above impairments.  Continue treatment per established plan of care.  to address goals.    Recommendation for discharge: (in order for the patient to meet his/her long term goals)  No skilled physical therapy/ follow up rehabilitation needs identified at this time.    This discharge recommendation:  Has not yet been discussed the attending provider and/or case management    IF patient discharges home will need the following DME: rollator and wheelchair       SUBJECTIVE:   Patient stated "I can manage those stairs with her help."    OBJECTIVE DATA SUMMARY:   Critical Behavior:  Neurologic State: Alert  Orientation Level: Oriented X4  Cognition: Appropriate decision making,Follows commands  Safety/Judgement: Awareness of environment,Fall prevention  Functional Mobility Training:  Bed Mobility:  Rolling: Modified independent;Supervision  Supine to Sit: Modified independent     Scooting: Modified independent        Transfers:  Sit to Stand: Modified independent  Stand to Sit: Modified independent  Stand Pivot Transfers: Modified independent                          Balance:  Sitting - Static: Good (unsupported)  Sitting - Dynamic: Good (unsupported)  Standing: Intact;Without support  Standing - Static: Good  Standing - Dynamic : Constant support;Fair  Ambulation/Gait Training:     Assistive Device: Gait belt;Walker, rolling  Ambulation -  Level of Assistance: Modified independent        Gait Abnormalities: Decreased step clearance        Base of Support: Narrowed     Speed/Cadence: Pace decreased (<100 feet/min)       Stairs - Level of Assistance:  (NT)    Pain Rating:  0/10    Activity Tolerance:   Fair    After treatment patient left in no apparent distress:   Sitting in chair, Call bell within reach, and Caregiver / family present    COMMUNICATION/COLLABORATION:   The patient's plan of care was discussed with: Registered nurse.     Delon DELENA Stabs, PT, DPT   Time Calculation: 30 mins

## 2021-02-16 NOTE — Anesthesia Post-Procedure Evaluation (Signed)
This patient was discharged from PACU without a sign-out note.

## 2021-03-22 ENCOUNTER — Ambulatory Visit: Admit: 2021-03-22 | Discharge: 2021-03-22 | Payer: MEDICARE | Attending: Family | Primary: Family Medicine

## 2021-03-22 ENCOUNTER — Ambulatory Visit: Attending: Family | Primary: Family Medicine

## 2021-03-22 DIAGNOSIS — E119 Type 2 diabetes mellitus without complications: Secondary | ICD-10-CM

## 2021-03-22 MED ORDER — PANTOPRAZOLE 40 MG TAB, DELAYED RELEASE
40 mg | ORAL_TABLET | Freq: Every day | ORAL | 1 refills | Status: DC
Start: 2021-03-22 — End: 2021-06-08

## 2021-03-22 MED ORDER — LEVOTHYROXINE 50 MCG TAB
50 mcg | ORAL_TABLET | Freq: Every day | ORAL | 1 refills | Status: DC
Start: 2021-03-22 — End: 2021-06-08

## 2021-03-22 MED ORDER — ATORVASTATIN 10 MG TAB
10 mg | ORAL_TABLET | Freq: Every day | ORAL | 2 refills | Status: DC
Start: 2021-03-22 — End: 2021-06-08

## 2021-03-22 MED ORDER — MIDODRINE 5 MG TAB
5 mg | ORAL_TABLET | Freq: Three times a day (TID) | ORAL | 1 refills | Status: DC
Start: 2021-03-22 — End: 2021-06-08

## 2021-03-22 NOTE — Assessment & Plan Note (Signed)
asymptomatic, continue current medications

## 2021-03-22 NOTE — Assessment & Plan Note (Signed)
well controlled, continue current medications

## 2021-03-22 NOTE — Progress Notes (Signed)
Chief Complaint   Patient presents with   . Annual Wellness Visit     Visit Vitals  BP 137/87 (BP 1 Location: Right arm, BP Patient Position: Sitting, BP Cuff Size: Adult)   Pulse (!) 120   Temp 100 F (37.8 C) (Oral)   Resp 18   Ht 5\' 3"  (1.6 m)   Wt 114 lb 3.2 oz (51.8 kg)   SpO2 95%   BMI 20.23 kg/m     1. "Have you been to the ER, urgent care clinic since your last visit?  Hospitalized since your last visit?" yes  [facility] 01/2021    2. "Have you seen or consulted any other health care providers outside of the [facility] since your last visit?" yes     3. For patients aged 73-75: Has the patient had a colonoscopy / FIT/ Cologuard? NA - based on age      If the patient is female:    4. For patients aged 43-74: Has the patient had a mammogram within the past 2 years? NA - based on age or sex      39. For patients aged 21-65: Has the patient had a pap smear? NA - based on age or sex

## 2021-03-22 NOTE — Assessment & Plan Note (Signed)
borderline controlled, changes made today: Referral to pain management

## 2021-03-22 NOTE — Progress Notes (Signed)
HPI     Chief Complaint   Patient presents with   ??? Annual Wellness Visit        HPI:  Savannah Irwin is a 75 y.o. female who is here to establish care with a new provider.     Patient presents to the office today establish care with a new provider.  Patient was admitted on 02/04/2021 to [facility] and discharged on 02/10/2021.  Admitting diagnosis was hemorrhagic shock secondary to acute blood loss    Patient has numerous bottles of medications some dating back 5 years.  Patient is unsure which medication she is taking.  Discussed with niece and patient the need to obtain an up-to-date record for medication reconciliation.  Patient states the medication was provided to the hospital during her most recent visit is an accurate.  Patient is to request medical records from previous primary care provider and submit to this office for review.  Patient is understanding that the medications we are currently filling her based on the recommendations of the hospital and do not encompass all the medications the patient may be on.  Patient and family member state understanding.    Graves' disease: Patient currently taking Synthroid, compliant with medication, no side effects, denies heat intolerance, sweating or unintentional weight loss.  No tremors or palpitations.    Emphysema: States no hospitalization over the last year for emphysema exacerbation.  Patient is not on medications for emphysema.  Patient does endorse smoking.    Hyperlipidemia: Mixed hyperlipidemia well controlled on atorvastatin.   denies any leg cramps or malaise from this medication.  Reported compliance with taking medication daily.     Depression: compliant with center laying in amitriptyline, No side effects.Not attending psychotherapy, no suicidal or homicidal ideations.  Has a strong support group at home.    Gout: Patient denies having attacks over the last year, does not complain of tophi formation, compliant with uric acid  lowering agent.  Not using NSAIDs.  Discontinued use of diuretics.    Diabetes: Last A1c was   Lab Results   Component Value Date/Time    Hemoglobin A1c 6.3 (H) 02/05/2021 09:53 AM    .Diabetes well controlled by diet patient's last eye exam is unknown . Denies neuropathy.      Allergies   Allergen Reactions   ??? Aluminum Rash     Discoloration to skin   ??? Penicillins Itching       Current Outpatient Medications   Medication Sig   ??? levothyroxine (SYNTHROID) 50 mcg tablet Take 1 Tablet by mouth Daily (before breakfast).   ??? pantoprazole (PROTONIX) 40 mg tablet Take 1 Tablet by mouth daily.   ??? atorvastatin (LIPITOR) 10 mg tablet Take 1 Tablet by mouth daily.   ??? midodrine (PROAMATINE) 5 mg tablet Take 1 Tablet by mouth three (3) times daily.   ??? sertraline (ZOLOFT) 50 mg tablet Take 50 mg by mouth daily.   ??? ergocalciferol (Vitamin D2) 1,250 mcg (50,000 unit) capsule Take 50,000 Units by mouth every seven (7) days.   ??? azelastine-fluticasone 137-50 mcg/spray spry 2 Sprays by Nasal route two (2) times a day.   ??? clotrimazole (Lotrimin AF, clotrimazole,) 1 % topical cream Apply  to affected area two (2) times a day.   ??? ipratropium (ATROVENT HFA) 17 mcg/actuation inhaler Take 2 Puffs by inhalation every six (6) hours as needed for Wheezing.   ??? allopurinoL (ZYLOPRIM) 100 mg tablet Take 100 mg by mouth daily.   ???  amitriptyline (ELAVIL) 50 mg tablet Take 50 mg by mouth nightly.   ??? atenoloL (TENORMIN) 25 mg tablet Take 25 mg by mouth daily.   ??? sodium chloride 1 gram tablet Take 1 g by mouth daily. Take one tablet by mouth daily for 7 days     No current facility-administered medications for this visit.     Last PDMP Loraine Leriche as Reviewed:  Review User Review Instant Review Result   Darleen Crocker 03/22/2021  3:19 PM Reviewed PDMP [1]       Review of Systems   Constitutional: Positive for weight loss. Negative for chills, fever and malaise/fatigue.   HENT: Negative for ear discharge, ear pain, hearing loss, nosebleeds and  tinnitus.    Eyes: Negative for blurred vision, double vision, photophobia, pain and discharge.   Respiratory: Positive for cough (Intermittent nonproductive) and wheezing (Bilateral wheezing noted). Negative for hemoptysis, sputum production and shortness of breath.    Cardiovascular: Negative for chest pain, palpitations, orthopnea, claudication and leg swelling.   Gastrointestinal: Negative for abdominal pain, constipation, diarrhea, heartburn, nausea and vomiting.   Genitourinary: Negative for dysuria, frequency and urgency.   Musculoskeletal: Negative for joint pain and myalgias.   Skin: Negative for itching and rash.   Neurological: Negative for dizziness, tingling, loss of consciousness, weakness and headaches.   Endo/Heme/Allergies: Does not bruise/bleed easily.   Psychiatric/Behavioral: Negative for depression. The patient is not nervous/anxious.    All other systems reviewed and are negative.      Reviewed PmHx, FmHx, SocHx as well as meds and allergies, updated and dated in the chart.         Objective     Visit Vitals  BP 137/87 (BP 1 Location: Right arm, BP Patient Position: Sitting, BP Cuff Size: Adult)   Pulse (!) 120   Temp 100 ??F (37.8 ??C) (Oral)   Resp 18   Ht 5\' 3"  (1.6 m)   Wt 114 lb 3.2 oz (51.8 kg)   SpO2 95%   BMI 20.23 kg/m??     Physical Exam  Vitals and nursing note reviewed.   Constitutional:       Appearance: Normal appearance. She is normal weight.   HENT:      Head: Normocephalic.   Eyes:      Extraocular Movements: Extraocular movements intact.      Conjunctiva/sclera: Conjunctivae normal.      Pupils: Pupils are equal, round, and reactive to light.   Cardiovascular:      Rate and Rhythm: Normal rate and regular rhythm.      Pulses: Normal pulses.      Heart sounds: Normal heart sounds.   Pulmonary:      Effort: Pulmonary effort is normal.      Breath sounds: Normal breath sounds.   Musculoskeletal:         General: Normal range of motion.      Cervical back: Normal range of motion and  neck supple.   Skin:     General: Skin is warm and dry.   Neurological:      General: No focal deficit present.      Mental Status: She is alert and oriented to person, place, and time.   Psychiatric:         Mood and Affect: Mood normal.             Assessment and Plan     Diagnoses and all orders for this visit:    1. Type 2  diabetes mellitus without complication, with long-term current use of insulin (HCC)  Assessment & Plan:   asymptomatic, continue current treatment plan      2. Gastroesophageal reflux disease, unspecified whether esophagitis present  Assessment & Plan:   well controlled, continue current medications    Orders:  -     pantoprazole (PROTONIX) 40 mg tablet; Take 1 Tablet by mouth daily.    3. Graves' disease  Assessment & Plan:   asymptomatic, continue current medications    Orders:  -     levothyroxine (SYNTHROID) 50 mcg tablet; Take 1 Tablet by mouth Daily (before breakfast).    4. Mixed hyperlipidemia  Assessment & Plan:   asymptomatic, continue current medications    Orders:  -     atorvastatin (LIPITOR) 10 mg tablet; Take 1 Tablet by mouth daily.    5. Chronic midline low back pain with bilateral sciatica  Assessment & Plan:   borderline controlled, changes made today: Referral to pain management    Orders:  -     REFERRAL TO PAIN MANAGEMENT    6. Hypotension, unspecified hypotension type  Assessment & Plan:   at goal, continue current medications Midodrin    Orders:  -     midodrine (PROAMATINE) 5 mg tablet; Take 1 Tablet by mouth three (3) times daily.    7. Anxiety and depression  Assessment & Plan:   asymptomatic, continue current medications         Medication Side Effects and Warnings were discussed with patient.  Patient Labs were reviewed and or requested.  Patient Past Records were reviewed and or requested.     On this date 03/22/2021 I have spent 60  minutes reviewing previous notes, test results and face to face with the patient discussing the diagnosis and plan of care as well as  documenting on the day of the visit.    Please note that this dictation was completed with Dragon, the computer voice recognition software.  Quite often unanticipated grammatical, syntax, homophones, and other interpretive errors are inadvertently transcribed by the computer software.  Please disregard these errors.  Please excuse any errors that have escaped final proofreading.        I have discussed the diagnosis with the patient and the intended plan as seen in the above orders. The patient has received an after-visit summary and questions were answered concerning future plans.  I have discussed medication side effects and warnings with the patient as well.    Mayli Covington M. Azucena Kuba, FNP  [address] [facility]  [phone] [facility]

## 2021-03-22 NOTE — Assessment & Plan Note (Signed)
asymptomatic, continue current treatment plan

## 2021-03-22 NOTE — Assessment & Plan Note (Signed)
at goal, continue current medications Midodrin

## 2021-05-05 NOTE — Telephone Encounter (Signed)
-----   Message from [name] sent at 05/04/2021 11:49 AM EDT -----  Subject: Message to Provider    QUESTIONS  Information for Provider? Savannah Irwin's niece [name] is just   wanting to confirm that PCP has received patient's medical records from   [facility] and if he was able to check through them so that   she can schedule her appt with PCP. Please f/u with [name] as soon as   possible. [phone]  ---------------------------------------------------------------------------  --------------  [name] INFO  What is the best way for the office to contact you? OK to leave message on   voicemail  Preferred Call Back Phone Number? [phone]  ---------------------------------------------------------------------------  --------------  SCRIPT ANSWERS  Relationship to Patient? Other  Representative Name? [name]  Is the Representative on the appropriate HIPAA document in Epic? Yes

## 2021-05-05 NOTE — Telephone Encounter (Signed)
-----   Message from Olam Angle sent at 05/03/2021  3:26 PM EDT -----  Subject: Message to Provider    QUESTIONS  Information for Provider? [place] from [facility] has repeatedly   attempted to fax pre-op clearance forms to this practice using fax #   [phone] but hasn't been successful; it keeps redialing and says   cancelled. Patient is scheduled for cataract surgery on 6/1 for left and   6/20 right eye. Please reach out to [place] at [phone] to get this   patient clear for surgery. Any representative at this number can assist.  ---------------------------------------------------------------------------  --------------  CALL BACK INFO  What is the best way for the office to contact you? Do not leave any   message, patient will call back for answer  Preferred Call Back Phone Number? [phone]  ---------------------------------------------------------------------------  --------------  SCRIPT ANSWERS  Relationship to Patient? Third Party  Third Party Type? Physician Office?   Representative Name? [place] from [employer]. [phone]

## 2021-06-08 ENCOUNTER — Encounter

## 2021-06-08 NOTE — Telephone Encounter (Signed)
Pt rescheduled appointment with Dr. Festus Aloe. 06/21/2021 she called regarding a couple medication she needs refills on:  Atorvastatin  Levothyroxine  Pantoprazole  Midodrine

## 2021-06-19 MED ORDER — LEVOTHYROXINE 50 MCG TAB
50 mcg | ORAL_TABLET | Freq: Every day | ORAL | 0 refills | Status: DC
Start: 2021-06-19 — End: 2021-06-21

## 2021-06-19 MED ORDER — ATORVASTATIN 10 MG TAB
10 mg | ORAL_TABLET | Freq: Every day | ORAL | 0 refills | Status: DC
Start: 2021-06-19 — End: 2021-06-21

## 2021-06-19 MED ORDER — MIDODRINE 5 MG TAB
5 mg | ORAL_TABLET | Freq: Three times a day (TID) | ORAL | 0 refills | Status: DC
Start: 2021-06-19 — End: 2021-06-21

## 2021-06-19 MED ORDER — PANTOPRAZOLE 40 MG TAB, DELAYED RELEASE
40 mg | ORAL_TABLET | Freq: Every day | ORAL | 0 refills | Status: DC
Start: 2021-06-19 — End: 2021-06-21

## 2021-06-21 ENCOUNTER — Encounter: Admit: 2021-06-21 | Discharge: 2021-06-21 | Payer: MEDICARE | Attending: Family Medicine | Primary: Family

## 2021-06-21 ENCOUNTER — Ambulatory Visit: Attending: Family Medicine | Primary: Family Medicine

## 2021-06-21 DIAGNOSIS — I10 Essential (primary) hypertension: Secondary | ICD-10-CM

## 2021-06-21 MED ORDER — PANTOPRAZOLE 40 MG TAB, DELAYED RELEASE
40 mg | ORAL_TABLET | Freq: Every day | ORAL | 0 refills | Status: AC
Start: 2021-06-21 — End: ?

## 2021-06-21 MED ORDER — INSULIN LISPRO 100 UNIT/ML (3 ML) SUB-Q PEN
100 unit/mL | SUBCUTANEOUS | 0 refills | Status: AC
Start: 2021-06-21 — End: ?

## 2021-06-21 MED ORDER — AMITRIPTYLINE 50 MG TAB
50 mg | ORAL_TABLET | Freq: Every evening | ORAL | 0 refills | Status: AC
Start: 2021-06-21 — End: ?

## 2021-06-21 MED ORDER — BRIVIACT 50 MG TABLET
50 mg | ORAL_TABLET | Freq: Two times a day (BID) | ORAL | 0 refills | Status: AC
Start: 2021-06-21 — End: ?

## 2021-06-21 MED ORDER — LEVOTHYROXINE 50 MCG TAB
50 mcg | ORAL_TABLET | Freq: Every day | ORAL | 0 refills | Status: AC
Start: 2021-06-21 — End: ?

## 2021-06-21 MED ORDER — TOPIRAMATE 25 MG TAB
25 mg | ORAL_TABLET | ORAL | 0 refills | Status: AC
Start: 2021-06-21 — End: ?

## 2021-06-21 MED ORDER — METOPROLOL TARTRATE 25 MG TAB
25 mg | ORAL_TABLET | Freq: Two times a day (BID) | ORAL | 0 refills | Status: AC
Start: 2021-06-21 — End: ?

## 2021-06-21 MED ORDER — INSULIN GLARGINE 100 UNIT/ML (3 ML) SUB-Q PEN
100 unit/mL (3 mL) | Freq: Every evening | SUBCUTANEOUS | 0 refills | Status: DC
Start: 2021-06-21 — End: 2021-06-26

## 2021-06-21 MED ORDER — ATORVASTATIN 10 MG TAB
10 mg | ORAL_TABLET | Freq: Every day | ORAL | 0 refills | Status: AC
Start: 2021-06-21 — End: ?

## 2021-06-21 MED ORDER — SERTRALINE 50 MG TAB
50 mg | ORAL_TABLET | Freq: Every day | ORAL | 0 refills | Status: AC
Start: 2021-06-21 — End: ?

## 2021-06-21 NOTE — Telephone Encounter (Signed)
Attempted to contact patient to inform her that we have not received her records from her previous PCP. Left voice message.

## 2021-06-21 NOTE — Progress Notes (Signed)
Savannah Irwin (DOB: 1946-04-23) is a 75 y.o. female, established patient, here for evaluation of the following chief complaint(s):  Medication Refill       ASSESSMENT/PLAN:  Below is the assessment and plan developed based on review of pertinent history, physical exam, labs, studies, and medications.    1. Essential hypertension  Chronic  -     CBC WITH AUTOMATED DIFF  -     METABOLIC PANEL, COMPREHENSIVE  -     LIPID PANEL  -     metoprolol tartrate (LOPRESSOR) 25 mg tablet; Take 1 Tablet by mouth two (2) times a day. For blood pressure, Normal, Disp-180 Tablet, R-0  Metoprolol increased 12.5 mg to 25 mg twice daily.  Monitor blood pressure, goal blood pressure less than 130/85 mg/dL.  2. Mixed hyperlipidemia  Chronic  -     LIPID PANEL  -     atorvastatin (LIPITOR) 10 mg tablet; Take 1 Tablet by mouth daily. For cholesterol, Normal, Disp-90 Tablet, R-0  3. Type 2 diabetes mellitus without complication, unspecified whether long term insulin use (HCC)  Chronic  -     HEMOGLOBIN A1C WITH EAG  -     MICROALBUMIN, UR, RAND W/ MICROALB/CREAT RATIO  -     METABOLIC PANEL, COMPREHENSIVE  -     LIPID PANEL  -     insulin glargine (LANTUS,BASAGLAR) 100 unit/mL (3 mL) inpn; 12 Units by SubCUTAneous route nightly. For diabetes, Normal, Disp-15 mL, R-0  -     insulin lispro (HUMALOG) 100 unit/mL kwikpen; USE AS DIRECTED BEFORE MEAL(S) AND AT BEDTIME PER SLIDING SCALE.IF 150-200 INJECT 2 UNITS,201-250 INJECT 4 U,251-300 INJECT 6 U,301-350 INJECT 8 U,351-400 INJECT 10 U. GIVE 12U IF BS GREATER THAN 401 CALL MD., Normal, Disp-15 mL, R-0  4. Seizure disorder (HCC)  Chronic  -     topiramate (TOPAMAX) 25 mg tablet; TAKE 1 TABLET BY MOUTH ONCE DAILY FOR MIGRAINE/SEIZURE., Normal, Disp-90 Tablet, R-0  -     Briviact 50 mg tablet; Take 1 Tablet by mouth two (2) times a day. Max Daily Amount: 100 mg., Normal, Disp-180 Tablet, R-0, DAW  Refer to neurology.  5. Gastroesophageal reflux disease, unspecified whether esophagitis  present  Chronic  -     pantoprazole (PROTONIX) 40 mg tablet; Take 1 Tablet by mouth daily. For heartburn, Normal, Disp-90 Tablet, R-0  6. Anxiety and depression  Chronic  -     sertraline (ZOLOFT) 50 mg tablet; Take 1 Tablet by mouth daily. For depression, Normal, Disp-90 Tablet, R-0  -     amitriptyline (ELAVIL) 50 mg tablet; Take 1 Tablet by mouth nightly. For pain, Normal, Disp-90 Tablet, R-0  7. Chronic gout without tophus, unspecified cause, unspecified site  Chronic  -     URIC ACID  8. Hypothyroidism, unspecified type  Chronic  -     TSH RFX ON ABNORMAL TO FREE T4  -     levothyroxine (Euthyrox) 50 mcg tablet; Take 1 Tablet by mouth Daily (before breakfast). For hypothyroid, Normal, Disp-90 Tablet, R-0  9. History of Graves' disease  10. Vitamin D deficiency  Chronic  Noted  -     VITAMIN D, 25 HYDROXY      Return in about 3 months (around 09/21/2021) for medicare wellness, chronic care follow up.      SUBJECTIVE/OBJECTIVE:  HPI    75 year old female history of hypertension, hyperlipidemia, type 2 diabetes insulin-dependent, seizure disorder, GERD, anxiety and depression, gout, hypothyroidism and vitamin D deficiency  presents to the office for routine follow-up.    Hospitalized earlier this year for GI bleed, on GI prophylaxis.  Hospitalized in May for syncope at [facility].  Etiology determined to be seizure.  Patient already taking Topamax for seizure prophylaxis.  Remote history of seizures prior to hospitalization.  Placed on Briviact in hospital and patient states she is taking.  Patient also prescribed Fioricet for migraines.  Does not see Neurology.    On levothyroxine for hypothyroidism.      For diabetes, injecting 12 units of Lantus insulin and lispro on sliding scale.  Fasting blood sugars 100-110 mg/dL.    Taking metoprolol 12.5 mg twice a day for blood pressure and atorvastatin 10 mg for hypercholesterolemia.    Medical records from previous PCP not available yet.  Patient would like update  on getting Rx for pain drugs for her back pain.      Allergies   Allergen Reactions   ??? Aluminum Rash     Discoloration to skin   ??? Penicillins Itching     Current Outpatient Medications   Medication Sig   ??? metoprolol tartrate (LOPRESSOR) 25 mg tablet Take 1 Tablet by mouth two (2) times a day. For blood pressure   ??? levothyroxine (Euthyrox) 50 mcg tablet Take 1 Tablet by mouth Daily (before breakfast). For hypothyroid   ??? insulin glargine (LANTUS,BASAGLAR) 100 unit/mL (3 mL) inpn 12 Units by SubCUTAneous route nightly. For diabetes   ??? insulin lispro (HUMALOG) 100 unit/mL kwikpen USE AS DIRECTED BEFORE MEAL(S) AND AT BEDTIME PER SLIDING SCALE.IF 150-200 INJECT 2 UNITS,201-250 INJECT 4 U,251-300 INJECT 6 U,301-350 INJECT 8 U,351-400 INJECT 10 U. GIVE 12U IF BS GREATER THAN 401 CALL MD.   ??? pantoprazole (PROTONIX) 40 mg tablet Take 1 Tablet by mouth daily. For heartburn   ??? sertraline (ZOLOFT) 50 mg tablet Take 1 Tablet by mouth daily. For depression   ??? amitriptyline (ELAVIL) 50 mg tablet Take 1 Tablet by mouth nightly. For pain   ??? atorvastatin (LIPITOR) 10 mg tablet Take 1 Tablet by mouth daily. For cholesterol   ??? topiramate (TOPAMAX) 25 mg tablet TAKE 1 TABLET BY MOUTH ONCE DAILY FOR MIGRAINE/SEIZURE.   ??? Briviact 50 mg tablet Take 1 Tablet by mouth two (2) times a day. Max Daily Amount: 100 mg.   ??? ergocalciferol (Vitamin D2) 1,250 mcg (50,000 unit) capsule Take 50,000 Units by mouth every seven (7) days.   ??? ipratropium (ATROVENT HFA) 17 mcg/actuation inhaler Take 2 Puffs by inhalation every six (6) hours as needed for Wheezing.   ??? allopurinoL (ZYLOPRIM) 100 mg tablet Take 100 mg by mouth daily.   ??? clotrimazole (Lotrimin AF, clotrimazole,) 1 % topical cream Apply  to affected area two (2) times a day.     No current facility-administered medications for this visit.     Past Medical History:   Diagnosis Date   ??? Acquired hypothyroidism    ??? Anxiety and depression    ??? Emphysema lung (HCC)    ??? Emphysema lung  (HCC)    ??? Fibromyalgia    ??? Gout    ??? Graves disease    ??? Graves' disease    ??? Heart failure with preserved ejection fraction (HCC)    ??? Hemorrhagic shock (HCC) 02/05/2021   ??? Pulmonary hypertension [facility])     Per records from [facility], patient has a history of severe pulmonary hypertension as admission of February, 2022.  Previously, echocardiogram of 11/05/2016 at  [facility] showed mild pulmonary hypertension, normal calculated LVEF of 63%, mild tricuspid regurgitation and trace mitral regurgitation.   ??? Type 2 diabetes mellitus (HCC)    ??? Underweight    ??? Upper GI bleed 02/07/2021     Past Surgical History:   Procedure Laterality Date   ??? COLONOSCOPY N/A 02/09/2021    COLONOSCOPY performed by Arman Filter, MD at [facility] ENDOSCOPY   ??? HX CHOLECYSTECTOMY  1972   ??? HX HYSTERECTOMY       Family History   Problem Relation Age of Onset   ??? OSTEOARTHRITIS Mother    ??? Diabetes Mother    ??? Hypertension Mother    ??? Elevated Lipids Sister    ??? Hypertension Sister    ??? Thyroid Disease Sister         Luiz Blare' disease     Social History     Tobacco Use   Smoking Status Current Every Day Smoker   ??? Packs/day: 0.50   Smokeless Tobacco Never Used   Tobacco Comment    Patient stated that she started smoking at the age of 45.         Review of Systems   All other systems reviewed and are negative.      BP (!) 166/89 (BP 1 Location: Right upper arm, BP Patient Position: Sitting, BP Cuff Size: Adult)    Pulse 76    Resp 16    Ht 5\' 3"  (1.6 m)    Wt 122 lb (55.3 kg)    SpO2 95%    BMI 21.61 kg/m??    Physical Exam  Vitals reviewed.   Constitutional:       Appearance: Normal appearance.   HENT:      Head: Normocephalic and atraumatic.   Neck:      Vascular: No carotid bruit.   Cardiovascular:      Rate and Rhythm: Normal rate and regular rhythm.      Pulses: Normal pulses.      Heart sounds: Normal heart sounds.   Pulmonary:      Effort: Pulmonary effort is normal.      Breath sounds: Normal breath sounds.    Abdominal:      General: Bowel sounds are normal.      Palpations: Abdomen is soft.   Musculoskeletal:      Cervical back: Neck supple.      Right lower leg: No edema.      Left lower leg: No edema.   Skin:     General: Skin is warm.      Capillary Refill: Capillary refill takes less than 2 seconds.   Neurological:      General: No focal deficit present.      Mental Status: She is alert and oriented to person, place, and time.   Psychiatric:         Mood and Affect: Mood normal.             On this date 06/21/2021 I have spent 30 minutes reviewing previous notes, test results and face to face with the patient discussing the diagnosis and importance of compliance with the treatment plan as well as documenting on the day of the visit.    An electronic signature was used to authenticate this note.  -- 06/23/2021, MD   [address] F[address]  [place], [place]  [place]

## 2021-06-21 NOTE — Progress Notes (Signed)
 Chief Complaint   Patient presents with   . Medication Refill     Visit Vitals  BP (!) 166/89 (BP 1 Location: Right upper arm, BP Patient Position: Sitting, BP Cuff Size: Adult)   Pulse 76   Resp 16   Ht 5' 3 (1.6 m)   Wt 122 lb (55.3 kg)   SpO2 95%   BMI 21.61 kg/m     1. Have you been to the ER, urgent care clinic since your last visit?  Hospitalized since your last visit? No    2. Have you seen or consulted any other health care providers outside of the [facility] since your last visit? No     3. For patients aged 59-75: Has the patient had a colonoscopy / FIT/ Cologuard? Yes - no Care Gap present      If the patient is female:    4. For patients aged 10-74: Has the patient had a mammogram within the past 2 years? Yes - no Care Gap present      5. For patients aged 21-65: Has the patient had a pap smear? No

## 2021-06-21 NOTE — Progress Notes (Signed)
Please inform patient of her test results with recommendations:  Diabetes is poorly controlled.  Her A1c is 10.0% which is congruent with an estimated average glucose of 240 mg/dL.  This is very high.  Goal blood sugars are 100-126 mg/dL.  I would like her to inject 15 units of Lantus instead of 12 units. She may continue injecting Humalog on a sliding scale.    Her other test results are good and stable including very good cholesterol levels.    Continue all other medications that she is prescribed without change.  We still do not have records from her last PCP.  Patient can expedite getting records by calling her former PCPs office and requesting them.

## 2021-06-22 ENCOUNTER — Encounter: Payer: MEDICARE | Attending: Family | Primary: Family Medicine

## 2021-06-22 LAB — METABOLIC PANEL, COMPREHENSIVE
A-G Ratio: 1.1 — ABNORMAL LOW (ref 1.2–2.2)
ALT (SGPT): 15 IU/L (ref 0–32)
AST (SGOT): 14 IU/L (ref 0–40)
Albumin: 4.2 g/dL (ref 3.7–4.7)
Alk. phosphatase: 150 IU/L — ABNORMAL HIGH (ref 44–121)
BUN/Creatinine ratio: 17 (ref 12–28)
BUN: 17 mg/dL (ref 8–27)
Bilirubin, total: 0.4 mg/dL (ref 0.0–1.2)
CO2: 20 mmol/L (ref 20–29)
Calcium: 9 mg/dL (ref 8.7–10.3)
Chloride: 104 mmol/L (ref 96–106)
Creatinine: 0.99 mg/dL (ref 0.57–1.00)
GLOBULIN, TOTAL: 3.9 g/dL (ref 1.5–4.5)
Glucose: 138 mg/dL — ABNORMAL HIGH (ref 65–99)
Potassium: 3.6 mmol/L (ref 3.5–5.2)
Protein, total: 8.1 g/dL (ref 6.0–8.5)
Sodium: 140 mmol/L (ref 134–144)
eGFR: 59 mL/min/{1.73_m2} — ABNORMAL LOW (ref >59)

## 2021-06-22 LAB — CBC WITH AUTOMATED DIFF
ABS. BASOPHILS: 0 10*3/uL (ref 0.0–0.2)
ABS. EOSINOPHILS: 0.1 10*3/uL (ref 0.0–0.4)
ABS. IMM. GRANS.: 0 10*3/uL (ref 0.0–0.1)
ABS. MONOCYTES: 0.2 10*3/uL (ref 0.1–0.9)
ABS. NEUTROPHILS: 1.4 10*3/uL (ref 1.4–7.0)
Abs Lymphocytes: 1.3 10*3/uL (ref 0.7–3.1)
BASOPHILS: 1 %
EOSINOPHILS: 2 %
HCT: 40.4 % (ref 34.0–46.6)
HGB: 12.9 g/dL (ref 11.1–15.9)
IMMATURE GRANULOCYTES: 0 %
Lymphocytes: 44 %
MCH: 27.8 pg (ref 26.6–33.0)
MCHC: 31.9 g/dL (ref 31.5–35.7)
MCV: 87 fL (ref 79–97)
MONOCYTES: 7 %
NEUTROPHILS: 46 %
PLATELET: 167 10*3/uL (ref 150–450)
RBC: 4.64 x10E6/uL (ref 3.77–5.28)
RDW: 16.1 % — ABNORMAL HIGH (ref 11.7–15.4)
WBC: 3 10*3/uL — ABNORMAL LOW (ref 3.4–10.8)

## 2021-06-22 LAB — LIPID PANEL
Cholesterol, Total: 126 mg/dL (ref 100–199)
Cholesterol, total: 126 mg/dL (ref 100–199)
HDL Cholesterol: 43 mg/dL (ref >39)
HDL: 43 mg/dL (ref >39)
LDL Calculated: 66 mg/dL (ref 0–99)
LDL, calculated: 66 mg/dL (ref 0–99)
Triglyceride: 85 mg/dL (ref 0–149)
Triglycerides: 85 mg/dL (ref 0–149)
VLDL, calculated: 17 mg/dL (ref 5–40)
VLDL: 17 mg/dL (ref 5–40)

## 2021-06-22 LAB — TSH RFX ON ABNORMAL TO FREE T4
TSH: 3.17 u[IU]/mL (ref 0.450–4.500)
TSH: 3.17 u[IU]/mL (ref 0.450–4.500)

## 2021-06-22 LAB — MICROALBUMIN, UR, RAND W/ MICROALB/CREAT RATIO
Creatinine, urine random: 12.2 mg/dL
Microalb/Creat ratio (ug/mg creat.): 1081 mg/g creat — ABNORMAL HIGH (ref 0–29)
Microalbumin, urine: 131.9 ug/mL

## 2021-06-22 LAB — HEMOGLOBIN A1C WITH EAG
Estimated average glucose: 240 mg/dL
Hemoglobin A1c: 10 % — ABNORMAL HIGH (ref 4.8–5.6)

## 2021-06-22 LAB — VITAMIN D, 25 HYDROXY: VITAMIN D, 25-HYDROXY: 64.7 ng/mL (ref 30.0–100.0)

## 2021-06-22 LAB — URIC ACID
Uric Acid, Serum: 5.6 mg/dL (ref 3.1–7.9)
Uric acid: 5.6 mg/dL (ref 3.1–7.9)

## 2021-06-22 LAB — CBC WITH AUTO DIFFERENTIAL
Basophils %: 1 %
Basophils Absolute: 0 10*3/uL (ref 0.0–0.2)
Eosinophils %: 2 %
Eosinophils Absolute: 0.1 10*3/uL (ref 0.0–0.4)
Granulocyte Absolute Count: 0 10*3/uL (ref 0.0–0.1)
Hematocrit: 40.4 % (ref 34.0–46.6)
Hemoglobin: 12.9 g/dL (ref 11.1–15.9)
Immature Granulocytes: 0 %
Lymphocytes %: 44 %
Lymphocytes Absolute: 1.3 10*3/uL (ref 0.7–3.1)
MCH: 27.8 pg (ref 26.6–33.0)
MCHC: 31.9 g/dL (ref 31.5–35.7)
MCV: 87 fL (ref 79–97)
Monocytes %: 7 %
Monocytes Absolute: 0.2 10*3/uL (ref 0.1–0.9)
Neutrophils %: 46 %
Neutrophils Absolute: 1.4 10*3/uL (ref 1.4–7.0)
Platelets: 167 10*3/uL (ref 150–450)
RBC: 4.64 x10E6/uL (ref 3.77–5.28)
RDW: 16.1 % — ABNORMAL HIGH (ref 11.7–15.4)
WBC: 3 10*3/uL — ABNORMAL LOW (ref 3.4–10.8)

## 2021-06-22 LAB — COMPREHENSIVE METABOLIC PANEL
ALT: 15 IU/L (ref 0–32)
AST: 14 IU/L (ref 0–40)
Albumin/Globulin Ratio: 1.1 NA — ABNORMAL LOW (ref 1.2–2.2)
Albumin: 4.2 g/dL (ref 3.7–4.7)
Alkaline Phosphatase: 150 IU/L — ABNORMAL HIGH (ref 44–121)
BUN: 17 mg/dL (ref 8–27)
Bun/Cre Ratio: 17 NA (ref 12–28)
CO2: 20 mmol/L (ref 20–29)
Calcium: 9 mg/dL (ref 8.7–10.3)
Chloride: 104 mmol/L (ref 96–106)
Creatinine: 0.99 mg/dL (ref 0.57–1.00)
Est, Glomerular Filtration Rate: 59 mL/min/{1.73_m2} — ABNORMAL LOW (ref >59)
Globulin, Total: 3.9 g/dL (ref 1.5–4.5)
Glucose: 138 mg/dL — ABNORMAL HIGH (ref 65–99)
Potassium: 3.6 mmol/L (ref 3.5–5.2)
Sodium: 140 mmol/L (ref 134–144)
Total Bilirubin: 0.4 mg/dL (ref 0.0–1.2)
Total Protein: 8.1 g/dL (ref 6.0–8.5)

## 2021-06-22 LAB — VITAMIN D 25 HYDROXY: Vit D, 25-Hydroxy: 64.7 ng/mL (ref 30.0–100.0)

## 2021-06-22 LAB — HEMOGLOBIN A1C W/EAG
Hemoglobin A1C: 10 % — ABNORMAL HIGH (ref 4.8–5.6)
eAG: 240 mg/dL

## 2021-06-22 LAB — MICROALBUMIN / CREATININE URINE RATIO
Creatinine, Ur: 12.2 mg/dL
Microalb, Ur: 131.9 ug/mL
Microalbumin Creatinine Ratio: 1081 mg/g creat — ABNORMAL HIGH (ref 0–29)

## 2021-06-26 ENCOUNTER — Encounter

## 2021-06-26 MED ORDER — ALLOPURINOL 100 MG TAB
100 mg | ORAL_TABLET | Freq: Every day | ORAL | 1 refills | Status: AC
Start: 2021-06-26 — End: ?

## 2021-06-26 MED ORDER — INSULIN GLARGINE 100 UNIT/ML (3 ML) SUB-Q PEN
100 unit/mL (3 mL) | Freq: Every evening | SUBCUTANEOUS | 0 refills | Status: AC
Start: 2021-06-26 — End: ?

## 2021-06-26 NOTE — Telephone Encounter (Signed)
Spoke to patient, made aware of lab results. Requested refill on Allopurinol, patient also inquired about cataract surgery clearance pre-op form. Patient stated that she was told by a nurse that records from her PCP had been received on May 19th. Please advise on cataract pre-op clearance.

## 2021-07-07 NOTE — Telephone Encounter (Signed)
Pt is requesting hydrocodon  and muscle relaxer. Stated that she spoke with Dr. Festus Aloe about it and that he's look into it. Advised pt that Dr. Festus Aloe may want to speak with pt in office about that.

## 2021-07-11 NOTE — Telephone Encounter (Signed)
Spoke to patient about her muscle relaxer and hydrocodone. Informed patient that MD cannot prescribe until he receives and reviews medical records from last PCP.

## 2021-07-27 ENCOUNTER — Emergency Department: Admit: 2021-07-27 | Payer: MEDICARE | Primary: Family Medicine

## 2021-07-27 ENCOUNTER — Inpatient Hospital Stay
Admit: 2021-07-27 | Discharge: 2021-08-04 | Disposition: A | Discharge status: Home Health | Payer: MEDICARE | Attending: Internal Medicine | Admitting: Internal Medicine

## 2021-07-27 DIAGNOSIS — A4189 Other specified sepsis: Secondary | ICD-10-CM

## 2021-07-27 LAB — METABOLIC PANEL, COMPREHENSIVE
A-G Ratio: 0.5 — ABNORMAL LOW (ref 1.1–2.2)
ALT (SGPT): 10 U/L — ABNORMAL LOW (ref 12–78)
AST (SGOT): 12 U/L — ABNORMAL LOW (ref 15–37)
Albumin: 3.3 g/dL — ABNORMAL LOW (ref 3.5–5.0)
Alk. phosphatase: 92 U/L (ref 45–117)
Anion gap: 8 mmol/L (ref 5–15)
BUN/Creatinine ratio: 31 — ABNORMAL HIGH (ref 12–20)
BUN: 49 MG/DL — ABNORMAL HIGH (ref 6–20)
Bilirubin, total: 1.1 MG/DL — ABNORMAL HIGH (ref 0.2–1.0)
CO2: 22 mmol/L (ref 21–32)
Calcium: 9.6 MG/DL (ref 8.5–10.1)
Chloride: 107 mmol/L (ref 97–108)
Creatinine: 1.58 MG/DL — ABNORMAL HIGH (ref 0.55–1.02)
GFR est AA: 39 mL/min/{1.73_m2} — ABNORMAL LOW (ref >60)
GFR est non-AA: 32 mL/min/{1.73_m2} — ABNORMAL LOW (ref >60)
Globulin: 6.1 g/dL — ABNORMAL HIGH (ref 2.0–4.0)
Glucose: 238 mg/dL — ABNORMAL HIGH (ref 65–100)
Potassium: 4.2 mmol/L (ref 3.5–5.1)
Protein, total: 9.4 g/dL — ABNORMAL HIGH (ref 6.4–8.2)
Sodium: 137 mmol/L (ref 136–145)

## 2021-07-27 LAB — URINALYSIS W/ REFLEX CULTURE
BACTERIA, URINE: NEGATIVE /hpf
Bacteria: NEGATIVE /hpf
Bilirubin, Urine: NEGATIVE
Bilirubin: NEGATIVE
Glucose, Ur: NEGATIVE mg/dL
Glucose: NEGATIVE mg/dL
Ketone: 15 mg/dL — AB
Ketones, Urine: 15 mg/dL — AB
Leukocyte Esterase, Urine: NEGATIVE
Leukocyte Esterase: NEGATIVE
Nitrite, Urine: NEGATIVE
Nitrites: NEGATIVE
Protein, UA: 300 mg/dL — AB
Protein: 300 mg/dL — AB
Specific Gravity, UA: 1.018 (ref 1.003–1.030)
Specific gravity: 1.018 (ref 1.003–1.030)
Urobilinogen, UA, POCT: 1 EU/dL (ref 0.2–1.0)
Urobilinogen: 1 EU/dL (ref 0.2–1.0)
pH (UA): 5 (ref 5.0–8.0)
pH, UA: 5 (ref 5.0–8.0)

## 2021-07-27 LAB — POC LACTIC ACID: Lactic Acid (POC): 1.26 mmol/L (ref 0.40–2.00)

## 2021-07-27 LAB — CBC WITH AUTOMATED DIFF
ABS. BASOPHILS: 0 10*3/uL (ref 0.0–0.1)
ABS. EOSINOPHILS: 0 10*3/uL (ref 0.0–0.4)
ABS. IMM. GRANS.: 0 10*3/uL (ref 0.00–0.04)
ABS. LYMPHOCYTES: 1.5 10*3/uL (ref 0.8–3.5)
ABS. MONOCYTES: 0.6 10*3/uL (ref 0.0–1.0)
ABS. NEUTROPHILS: 5.9 10*3/uL (ref 1.8–8.0)
ABSOLUTE NRBC: 0 10*3/uL (ref 0.00–0.01)
BASOPHILS: 0 % (ref 0–1)
EOSINOPHILS: 0 % (ref 0–7)
HCT: 39.8 % (ref 35.0–47.0)
HGB: 12.7 g/dL (ref 11.5–16.0)
IMMATURE GRANULOCYTES: 1 % — ABNORMAL HIGH (ref 0.0–0.5)
LYMPHOCYTES: 19 % (ref 12–49)
MCH: 27.5 PG (ref 26.0–34.0)
MCHC: 31.9 g/dL (ref 30.0–36.5)
MCV: 86.3 FL (ref 80.0–99.0)
MONOCYTES: 7 % (ref 5–13)
MPV: 9.1 FL (ref 8.9–12.9)
NEUTROPHILS: 73 % (ref 32–75)
NRBC: 0 PER 100 WBC
PLATELET: 228 10*3/uL (ref 150–400)
RBC: 4.61 M/uL (ref 3.80–5.20)
RDW: 16.7 % — ABNORMAL HIGH (ref 11.5–14.5)
WBC: 8 10*3/uL (ref 3.6–11.0)

## 2021-07-27 LAB — TSH 3RD GENERATION
TSH: 0.61 u[IU]/mL (ref 0.36–3.74)
TSH: 0.61 u[IU]/mL (ref 0.36–3.74)

## 2021-07-27 LAB — SAMPLES BEING HELD

## 2021-07-27 LAB — COVID-19 RAPID TEST: COVID-19 rapid test: DETECTED — AB

## 2021-07-27 LAB — CK
CK: 40 U/L (ref 26–192)
Total CK: 40 U/L (ref 26–192)

## 2021-07-27 LAB — COMPREHENSIVE METABOLIC PANEL
ALT: 10 U/L — ABNORMAL LOW (ref 12–78)
AST: 12 U/L — ABNORMAL LOW (ref 15–37)
Albumin/Globulin Ratio: 0.5 — ABNORMAL LOW (ref 1.1–2.2)
Albumin: 3.3 g/dL — ABNORMAL LOW (ref 3.5–5.0)
Alkaline Phosphatase: 92 U/L (ref 45–117)
Anion Gap: 8 mmol/L (ref 5–15)
BUN: 49 MG/DL — ABNORMAL HIGH (ref 6–20)
Bun/Cre Ratio: 31 — ABNORMAL HIGH (ref 12–20)
CO2: 22 mmol/L (ref 21–32)
Calcium: 9.6 MG/DL (ref 8.5–10.1)
Chloride: 107 mmol/L (ref 97–108)
Creatinine: 1.58 MG/DL — ABNORMAL HIGH (ref 0.55–1.02)
EGFR IF NonAfrican American: 32 mL/min/{1.73_m2} — ABNORMAL LOW (ref >60)
GFR African American: 39 mL/min/{1.73_m2} — ABNORMAL LOW (ref >60)
Globulin: 6.1 g/dL — ABNORMAL HIGH (ref 2.0–4.0)
Glucose: 238 mg/dL — ABNORMAL HIGH (ref 65–100)
Potassium: 4.2 mmol/L (ref 3.5–5.1)
Sodium: 137 mmol/L (ref 136–145)
Total Bilirubin: 1.1 MG/DL — ABNORMAL HIGH (ref 0.2–1.0)
Total Protein: 9.4 g/dL — ABNORMAL HIGH (ref 6.4–8.2)

## 2021-07-27 LAB — CBC WITH AUTO DIFFERENTIAL
Basophils %: 0 % (ref 0–1)
Basophils Absolute: 0 10*3/uL (ref 0.0–0.1)
Eosinophils %: 0 % (ref 0–7)
Eosinophils Absolute: 0 10*3/uL (ref 0.0–0.4)
Granulocyte Absolute Count: 0 10*3/uL (ref 0.00–0.04)
Hematocrit: 39.8 % (ref 35.0–47.0)
Hemoglobin: 12.7 g/dL (ref 11.5–16.0)
Immature Granulocytes: 1 % — ABNORMAL HIGH (ref 0.0–0.5)
Lymphocytes %: 19 % (ref 12–49)
Lymphocytes Absolute: 1.5 10*3/uL (ref 0.8–3.5)
MCH: 27.5 PG (ref 26.0–34.0)
MCHC: 31.9 g/dL (ref 30.0–36.5)
MCV: 86.3 FL (ref 80.0–99.0)
MPV: 9.1 FL (ref 8.9–12.9)
Monocytes %: 7 % (ref 5–13)
Monocytes Absolute: 0.6 10*3/uL (ref 0.0–1.0)
NRBC Absolute: 0 10*3/uL (ref 0.00–0.01)
Neutrophils %: 73 % (ref 32–75)
Neutrophils Absolute: 5.9 10*3/uL (ref 1.8–8.0)
Nucleated RBCs: 0 PER 100 WBC
Platelets: 228 10*3/uL (ref 150–400)
RBC: 4.61 M/uL (ref 3.80–5.20)
RDW: 16.7 % — ABNORMAL HIGH (ref 11.5–14.5)
WBC: 8 10*3/uL (ref 3.6–11.0)

## 2021-07-27 LAB — COVID-19, RAPID: SARS-CoV-2, Rapid: DETECTED — AB

## 2021-07-27 LAB — POCT LACTIC ACID: POC Lactic Acid: 1.26 mmol/L (ref 0.40–2.00)

## 2021-07-27 MED ORDER — SODIUM CHLORIDE 0.9% BOLUS IV
0.9 % | Freq: Once | INTRAVENOUS | Status: AC
Start: 2021-07-27 — End: 2021-07-27
  Administered 2021-07-27: 20:00:00 via INTRAVENOUS

## 2021-07-27 MED ORDER — DEXTROSE 10% IN WATER (D10W) IV
10 % | INTRAVENOUS | Status: DC | PRN
Start: 2021-07-27 — End: 2021-08-01

## 2021-07-27 MED ORDER — TOPIRAMATE 25 MG TAB
25 mg | Freq: Two times a day (BID) | ORAL | Status: DC
Start: 2021-07-27 — End: 2021-08-04
  Administered 2021-07-28 – 2021-08-04 (×15): via ORAL

## 2021-07-27 MED ORDER — ATORVASTATIN 10 MG TAB
10 mg | Freq: Every day | ORAL | Status: DC
Start: 2021-07-27 — End: 2021-08-04
  Administered 2021-07-28 – 2021-08-04 (×8): via ORAL

## 2021-07-27 MED ORDER — SODIUM CHLORIDE 0.9 % IV
INTRAVENOUS | Status: DC
Start: 2021-07-27 — End: 2021-07-29
  Administered 2021-07-28 – 2021-07-29 (×8): via INTRAVENOUS

## 2021-07-27 MED ORDER — ACETAMINOPHEN 650 MG RECTAL SUPPOSITORY
650 mg | RECTAL | Status: AC
Start: 2021-07-27 — End: 2021-07-27
  Administered 2021-07-27: 21:00:00 via RECTAL

## 2021-07-27 MED ORDER — ACETAMINOPHEN 325 MG TABLET
325 mg | Freq: Four times a day (QID) | ORAL | Status: DC | PRN
Start: 2021-07-27 — End: 2021-08-04
  Administered 2021-07-28 – 2021-08-04 (×9): via ORAL

## 2021-07-27 MED ORDER — METOPROLOL TARTRATE 25 MG TAB
25 mg | Freq: Two times a day (BID) | ORAL | Status: DC
Start: 2021-07-27 — End: 2021-08-04
  Administered 2021-07-28 – 2021-08-04 (×16): via ORAL

## 2021-07-27 MED ORDER — PANTOPRAZOLE 40 MG TAB, DELAYED RELEASE
40 mg | Freq: Every day | ORAL | Status: DC
Start: 2021-07-27 — End: 2021-08-04
  Administered 2021-07-28 – 2021-08-04 (×8): via ORAL

## 2021-07-27 MED ORDER — SODIUM CHLORIDE 0.9 % IJ SYRG
Freq: Three times a day (TID) | INTRAMUSCULAR | Status: DC
Start: 2021-07-27 — End: 2021-08-04
  Administered 2021-07-28 – 2021-08-04 (×23): via INTRAVENOUS

## 2021-07-27 MED ORDER — IPRATROPIUM-ALBUTEROL 2.5 MG-0.5 MG/3 ML NEB SOLUTION
2.5 mg-0.5 mg/3 ml | RESPIRATORY_TRACT | Status: DC | PRN
Start: 2021-07-27 — End: 2021-08-04
  Administered 2021-07-28: 01:00:00 via RESPIRATORY_TRACT

## 2021-07-27 MED ORDER — ONDANSETRON 4 MG TAB, RAPID DISSOLVE
4 mg | Freq: Three times a day (TID) | ORAL | Status: DC | PRN
Start: 2021-07-27 — End: 2021-08-04

## 2021-07-27 MED ORDER — INSULIN LISPRO 100 UNIT/ML INJECTION
100 unit/mL | Freq: Four times a day (QID) | SUBCUTANEOUS | Status: DC
Start: 2021-07-27 — End: 2021-07-29
  Administered 2021-07-28 (×2): via SUBCUTANEOUS

## 2021-07-27 MED ORDER — GLUCOSE 4 GRAM CHEWABLE TAB
4 gram | ORAL | Status: DC | PRN
Start: 2021-07-27 — End: 2021-08-04

## 2021-07-27 MED ORDER — POLYETHYLENE GLYCOL 3350 17 GRAM (100 %) ORAL POWDER PACKET
17 gram | Freq: Every day | ORAL | Status: DC | PRN
Start: 2021-07-27 — End: 2021-08-04

## 2021-07-27 MED ORDER — ACETAMINOPHEN 650 MG RECTAL SUPPOSITORY
650 mg | Freq: Four times a day (QID) | RECTAL | Status: DC | PRN
Start: 2021-07-27 — End: 2021-08-04

## 2021-07-27 MED ORDER — GLUCAGON 1 MG INJECTION
1 mg | INTRAMUSCULAR | Status: DC | PRN
Start: 2021-07-27 — End: 2021-08-04

## 2021-07-27 MED ORDER — LEVOTHYROXINE 50 MCG TAB
50 mcg | Freq: Every day | ORAL | Status: DC
Start: 2021-07-27 — End: 2021-08-04
  Administered 2021-07-28 – 2021-08-04 (×8): via ORAL

## 2021-07-27 MED ORDER — SERTRALINE 50 MG TAB
50 mg | Freq: Every day | ORAL | Status: DC
Start: 2021-07-27 — End: 2021-08-04
  Administered 2021-07-28 – 2021-08-04 (×8): via ORAL

## 2021-07-27 MED ORDER — SODIUM CHLORIDE 0.9 % IJ SYRG
INTRAMUSCULAR | Status: DC | PRN
Start: 2021-07-27 — End: 2021-08-04

## 2021-07-27 MED ORDER — ONDANSETRON (PF) 4 MG/2 ML INJECTION
4 mg/2 mL | Freq: Four times a day (QID) | INTRAMUSCULAR | Status: DC | PRN
Start: 2021-07-27 — End: 2021-08-04

## 2021-07-27 MED ORDER — ENOXAPARIN 30 MG/0.3 ML SUB-Q SYRINGE
30 mg/0.3 mL | Freq: Every day | SUBCUTANEOUS | Status: DC
Start: 2021-07-27 — End: 2021-07-28
  Administered 2021-07-28: 14:00:00 via SUBCUTANEOUS

## 2021-07-27 MED ORDER — BRIVARACETAM 25 MG TABLET
25 mg | Freq: Two times a day (BID) | ORAL | Status: DC
Start: 2021-07-27 — End: 2021-08-04
  Administered 2021-07-28 – 2021-08-04 (×16): via ORAL

## 2021-07-27 MED FILL — TOPIRAMATE 25 MG TAB: 25 mg | ORAL | Qty: 1

## 2021-07-27 MED FILL — BD POSIFLUSH NORMAL SALINE 0.9 % INJECTION SYRINGE: INTRAMUSCULAR | Qty: 40

## 2021-07-27 MED FILL — BRIVIACT 25 MG TABLET: 25 mg | ORAL | Qty: 2

## 2021-07-27 MED FILL — BD POSIFLUSH NORMAL SALINE 0.9 % INJECTION SYRINGE: INTRAMUSCULAR | Qty: 10

## 2021-07-27 MED FILL — ACETAMINOPHEN 650 MG RECTAL SUPPOSITORY: 650 mg | RECTAL | Qty: 1

## 2021-07-27 MED FILL — SODIUM CHLORIDE 0.9 % IV: INTRAVENOUS | Qty: 2000

## 2021-07-27 MED FILL — SODIUM CHLORIDE 0.9 % IV: INTRAVENOUS | Qty: 250

## 2021-07-27 NOTE — ED Provider Notes (Signed)
Patient is a 75 year old woman with a history of stroke, seizure, and Graves' disease who comes into the emergency department after being found down by her niece.  There was an unknown downtime and patient is unable to provide any context.  Patient has a history of left-sided deficits with stroke but is generally alert, oriented, and ambulatory.    The history is provided by the patient. The history is limited by the condition of the patient.      Past Medical History:   Diagnosis Date   ??? Acquired hypothyroidism    ??? Anxiety and depression    ??? Emphysema lung (HCC)    ??? Emphysema lung (HCC)    ??? Fibromyalgia    ??? Gout    ??? Graves disease    ??? Graves' disease    ??? Heart failure with preserved ejection fraction (HCC)    ??? Hemorrhagic shock (HCC) 02/05/2021   ??? Pulmonary hypertension [facility]     Per records from [facility], patient has a history of severe pulmonary hypertension as admission of February, 2022.  Previously, echocardiogram of 11/05/2016 at [facility] showed mild pulmonary hypertension, normal calculated LVEF of 63%, mild tricuspid regurgitation and trace mitral regurgitation.   ??? Type 2 diabetes mellitus (HCC)    ??? Underweight    ??? Upper GI bleed 02/07/2021       Past Surgical History:   Procedure Laterality Date   ??? COLONOSCOPY N/A 02/09/2021    COLONOSCOPY performed by Arman Filter, MD at [facility] ENDOSCOPY   ??? HX CHOLECYSTECTOMY  1972   ??? HX HYSTERECTOMY           Family History:   Problem Relation Age of Onset   ??? OSTEOARTHRITIS Mother    ??? Diabetes Mother    ??? Hypertension Mother    ??? Elevated Lipids Sister    ??? Hypertension Sister    ??? Thyroid Disease Sister         Luiz Blare' disease       Social History     Socioeconomic History   ??? Marital status: SINGLE     Spouse name: Not on file   ??? Number of children: Not on file   ??? Years of education: Not on file   ??? Highest education level: Not on file   Occupational History   ??? Not on file   Tobacco Use   ??? Smoking status: Every Day      Packs/day: 0.50     Types: Cigarettes   ??? Smokeless tobacco: Never   ??? Tobacco comments:     Patient stated that she started smoking at the age of 48.   Vaping Use   ??? Vaping Use: Never used   Substance and Sexual Activity   ??? Alcohol use: Not Currently     Comment: Patient denies any history of excessive alcohol intake.   ??? Drug use: Never   ??? Sexual activity: Not on file   Other Topics Concern   ??? Military Service Not Asked   ??? Blood Transfusions Not Asked   ??? Caffeine Concern Not Asked   ??? Occupational Exposure Not Asked   ??? Hobby Hazards Not Asked   ??? Sleep Concern Not Asked   ??? Stress Concern Not Asked   ??? Weight Concern Not Asked   ??? Special Diet Not Asked   ??? Back Care Not Asked   ??? Exercise Not Asked   ??? Bike Helmet Not Asked   ???  Seat Belt Not Asked   ??? Self-Exams Not Asked   Social History Narrative   ??? Not on file     Social Determinants of Health     Financial Resource Strain: Not on file   Food Insecurity: Not on file   Transportation Needs: Not on file   Physical Activity: Not on file   Stress: Not on file   Social Connections: Not on file   Intimate Partner Violence: Not on file   Housing Stability: Not on file         ALLERGIES: Aluminum and Penicillins    Review of Systems   Unable to perform ROS: Mental status change     Vitals:    07/27/21 1534   BP: 108/82   Pulse: (!) 140   Resp: 20   SpO2: 97%   Weight:   59 kg (130 lb)   Height: 5\' 3"  (1.6 m)            Physical Exam  Vitals and nursing note reviewed.   Constitutional:       Appearance: She is well-developed and underweight.   HENT:      Head: Normocephalic and atraumatic.   Eyes:      Pupils: Pupils are equal, round, and reactive to light.   Cardiovascular:      Rate and Rhythm: Normal rate and regular rhythm.   Pulmonary:      Effort: Pulmonary effort is normal.      Breath sounds: Normal breath sounds.   Abdominal:      General: There is no distension.      Palpations: Abdomen is soft.      Tenderness: no abdominal tenderness    Musculoskeletal:      Cervical back: Normal range of motion and neck supple.   Skin:     General: Skin is warm and dry.        Capillary Refill: Capillary refill takes less than 2 seconds.   Neurological:      Mental Status: She is alert. She is disoriented and confused.      GCS: GCS eye subscore is 4. GCS verbal subscore is 1. GCS motor subscore is 5.      Motor: Weakness present.   Psychiatric:         Attention and Perception: She is inattentive.         Speech: She is noncommunicative.         Behavior: Behavior is slowed and withdrawn.         Cognition and Memory: Cognition is impaired. Memory is impaired.        MDM  Number of Diagnoses or Management Options         Procedures    EKG performed at 3:41 PM shows sinus tachycardia, 137 bpm, normal axis, normal intervals, no ST changes, no T wave changes, no ectopy.  EKG is interpreted by , MD     DDX:  Dehydration, rhabdomyolysis, PNA, pericarditis, electrolyte abnormality, dehydration, influenza, UTI, pyelonephritis, gastroenteritis, diverticulitis, cholecystitis    Patient is being admitted to the hospital.  The results of their tests and reasons for their admission have been discussed with them and/or available family.  They convey agreement and understanding for the need to be admitted and for their admission diagnosis.  Consultation will be made now with the inpatient physician for hospitalization.    Perfect Serve Consult for Admission  6:37 PM    ED Room Number: ER20/20  Patient Name and age:  Savannah Irwin 75 y.o.  female  Working Diagnosis:   1. Dehydration    2. Fever, unspecified fever cause    3. Delirium      COVID-19 Suspicion:  yes  Sepsis present:  yes  Reassessment needed: no  Code Status:  Full Code  Readmission: no  Isolation Requirements:  no  Recommended Level of Care:  telemetry  Department:[facility] - [phone]

## 2021-07-27 NOTE — ED Notes (Signed)
Per EMS family at home found pt laying on floor. Unknown last known well. Pt hx of stroke with left sided facial droop at baseline.  Today presents with nonverbal new confusion.        Weakness noted more on left side with difficulty assessing limbs.       Poor historian from family.       Pt grimiest in pain with palpation.

## 2021-07-27 NOTE — ED Notes (Signed)
Verbal shift change report given to Wesley Blas RN (oncoming nurse) by Guadalupe Maple (offgoing nurse). Report included the following information SBAR, Kardex, ED Summary, Intake/Output, MAR and Recent Results.

## 2021-07-27 NOTE — ED Notes (Signed)
Doctor Lum Babe made aware via perfect serve of pt BP MAP of 63. Pt showing no signs or symptoms of hypotension. Pt denies light headedness or increased drowsiness. Awaiting response from doctor.

## 2021-07-27 NOTE — H&P (Signed)
[facility]  [address] [place] [place], [place]  [address]  [phone]    Admission History and Physical      NAME:  Savannah Irwin   DOB:   [birth year]   MRN:  [id number]     PCP:  Joyice Faster, MD     Date of service:  07/27/2021         Subjective:     CHIEF COMPLAINT. found on the ground unresponsive    HISTORY OF PRESENT ILLNESS:     Ms. Patch is a 75 y.o.  African American female who is admitted with acute encephalopathy.  Ms. Gaber's past medical history of hypothyroidism, anxiety, depression, emphysema, hypothyroidism, DM, seizure was brought to ER patient was found on the ground unresponsive.  Patient unable to provide history and try to contact family without response.  According to the reports, patient has a history of left-sided deficits with stroke but is generally alert, oriented, and ambulatory.  In ER, patient found to have a fever and acute renal failure.      Past Medical History:   Diagnosis Date    Acquired hypothyroidism     Anxiety and depression     Emphysema lung (HCC)     Emphysema lung (HCC)     Fibromyalgia     Gout     Graves disease     Graves' disease     Heart failure with preserved ejection fraction (HCC)     Hemorrhagic shock (HCC) 02/05/2021    Pulmonary hypertension [facility])     Per records from [facility], patient has a history of severe pulmonary hypertension as admission of February, 2022.  Previously, echocardiogram of 11/05/2016 at [facility] showed mild pulmonary hypertension, normal calculated LVEF of 63%, mild tricuspid regurgitation and trace mitral regurgitation.    Type 2 diabetes mellitus (HCC)     Underweight     Upper GI bleed 02/07/2021        Past Surgical History:   Procedure Laterality Date    COLONOSCOPY N/A 02/09/2021    COLONOSCOPY performed by Arman Filter, MD at [facility] ENDOSCOPY    HX CHOLECYSTECTOMY  1972    HX HYSTERECTOMY         Social History     Tobacco Use    Smoking status: Every Day     Packs/day:  0.50     Types: Cigarettes    Smokeless tobacco: Never    Tobacco comments:     Patient stated that she started smoking at the age of 34.   Substance Use Topics    Alcohol use: Not Currently     Comment: Patient denies any history of excessive alcohol intake.        Family History   Problem Relation Age of Onset    OSTEOARTHRITIS Mother     Diabetes Mother     Hypertension Mother     Elevated Lipids Sister     Hypertension Sister     Thyroid Disease Sister         Luiz Blare' disease        Allergies   Allergen Reactions    Aluminum Rash     Discoloration to skin    Penicillins Itching        Prior to Admission medications    Medication Sig Start Date End Date Taking? Authorizing Provider   insulin glargine (LANTUS,BASAGLAR) 100 unit/mL (3 mL) inpn 15 Units by SubCUTAneous  route nightly. For diabetes 06/26/21   Joyice Faster, MD   allopurinoL (ZYLOPRIM) 100 mg tablet Take 1 Tablet by mouth daily. 06/26/21   Joyice Faster, MD   metoprolol tartrate (LOPRESSOR) 25 mg tablet Take 1 Tablet by mouth two (2) times a day. For blood pressure 06/21/21   Joyice Faster, MD   levothyroxine (Euthyrox) 50 mcg tablet Take 1 Tablet by mouth Daily (before breakfast). For hypothyroid 06/21/21   Joyice Faster, MD   insulin lispro (HUMALOG) 100 unit/mL kwikpen USE AS DIRECTED BEFORE MEAL(S) AND AT BEDTIME PER SLIDING SCALE.IF 150-200 INJECT 2 UNITS,201-250 INJECT 4 U,251-300 INJECT 6 U,301-350 INJECT 8 U,351-400 INJECT 10 U. GIVE 12U IF BS GREATER THAN 401 CALL MD. 06/21/21   Joyice Faster, MD   pantoprazole (PROTONIX) 40 mg tablet Take 1 Tablet by mouth daily. For heartburn 06/21/21   Joyice Faster, MD   sertraline (ZOLOFT) 50 mg tablet Take 1 Tablet by mouth daily. For depression 06/21/21   Joyice Faster, MD   amitriptyline (ELAVIL) 50 mg tablet Take 1 Tablet by mouth nightly. For pain 06/21/21   Joyice Faster, MD   atorvastatin (LIPITOR) 10 mg tablet Take 1 Tablet by mouth daily. For cholesterol 06/21/21   Joyice Faster, MD   topiramate (TOPAMAX) 25 mg  tablet TAKE 1 TABLET BY MOUTH ONCE DAILY FOR MIGRAINE/SEIZURE. 06/21/21   Joyice Faster, MD   Briviact 50 mg tablet Take 1 Tablet by mouth two (2) times a day. Max Daily Amount: 100 mg. 06/21/21   Joyice Faster, MD   ergocalciferol (Vitamin D2) 1,250 mcg (50,000 unit) capsule Take 50,000 Units by mouth every seven (7) days.    Provider, Historical   clotrimazole (Lotrimin AF, clotrimazole,) 1 % topical cream Apply  to affected area two (2) times a day.    Provider, Historical   ipratropium (ATROVENT HFA) 17 mcg/actuation inhaler Take 2 Puffs by inhalation every six (6) hours as needed for Wheezing.    Provider, Historical         Review of Systems:  Patient unable to provide history       Objective:      VITALS:    Vital signs reviewed; most recent are:    Visit Vitals  BP (!) 128/52 (BP 1 Location: Right leg, BP Patient Position: At rest;Lying)   Pulse (!) 120   Temp 98.2 ??F (36.8 ??C)   Resp 16   Ht 5\' 3"  (1.6 m)   Wt 59 kg (130 lb)   SpO2 100%   BMI 23.03 kg/m??     SpO2 Readings from Last 6 Encounters:   07/27/21 100%   06/21/21 95%   03/22/21 95%   02/10/21 94%    O2 Flow Rate (L/min): 2 l/min   No intake or output data in the 24 hours ending 07/27/21 1922         Exam:     Physical Exam:    Gen:  Well-developed, well-nourished, in no acute distress  HEENT:  Pink conjunctivae, PERRL, hearing intact to voice, dry mucous membranes  Neck:  Supple, without masses, thyroid non-tender  Resp:  No accessory muscle use, clear breath sounds without wheezes rales or rhonchi  Card:  No murmurs, normal S1, S2 without thrills, bruits or peripheral edema  Abd:  Soft, non-tender, non-distended, normoactive bowel sounds are present, no palpable organomegaly and no detectable hernias  Lymph:  No cervical or inguinal adenopathy  Musc:  No cyanosis or clubbing  Skin:  No rashes or ulcers,  skin turgor is good  Neuro: Does not follows commands appropriately  Psych: Poor insight,        Labs:    Recent Labs     07/27/21  1619   WBC 8.0    HGB 12.7   HCT 39.8   PLT 228     Recent Labs     07/27/21  1619   NA 137   K 4.2   CL 107   CO2 22   GLU 238*   BUN 49*   CREA 1.58*   CA 9.6   ALB 3.3*   TBILI 1.1*   ALT 10*     No results found for: GLUCPOC  No results for input(s): PH, PCO2, PO2, HCO3, FIO2 in the last 72 hours.  No results for input(s): INR, INREXT in the last 72 hours.    Telemetry reviewed:    Sinus tachycardia       Assessment/Plan:    Unresponsiveness (07/27/2021)/ Encephalopathy acute (06/21/2021).  Unclear cause as history is limited.  It could be a seizure as patient has history of seizure VS dehydration VS infection.  Admit to telemetry.  CT of the head is unremarkable for acute finding.  Consult neurology     2.  Fever (07/27/2021).  Unclear source of fever.  UA and chest x-ray are unremarkable.  Check RVP, BC.  We will hold off antibiotics for now    3.  ARF (acute renal failure) (HCC) (07/27/2021)/  Dehydration (07/27/2021).  Clinically patient is dehydrated.  Avoid nephrotoxic drugs.  Continue IV fluid     4.  Type 2 diabetes mellitus (HCC).  Diet controlled.  Cover with sliding scale       5.  Anxiety and depression.  Continue home medication when takes p.o.     6.  Essential hypertension (06/21/2021).  BP is stable      7.  Emphysema lung (HCC).  Not wheezing.  DuoNeb as needed     8.  Mixed hyperlipidemia (03/22/2021).  Resume statin    9.  Hypothyroidism (06/21/2021).  Continue levothyroxine    10.  Tachycardia (07/27/2021).  Likely due to dehydration/fever.  Continue IV fluid        Addendum: multiple attempts to contact pt's niece failed.           Previous medical records reviewed     Risk of deterioration: high      Total time spent with patient: 58 Minutes                  Care Plan discussed with: Patient, Nursing Staff, and >50% of time spent in counseling and coordination of care    Discussed:  Care Plan    Prophylaxis:  Lovenox    Probable Disposition:  Home  w/Family           ___________________________________________________    Attending Physician: Otho Darner, MD

## 2021-07-27 NOTE — ED Notes (Signed)
Advised by doctor Eniola to increase NS infusion to 170mL/Hr for pt's hypotensive BP readings.

## 2021-07-28 ENCOUNTER — Inpatient Hospital Stay: Admit: 2021-07-28 | Payer: MEDICARE | Primary: Family Medicine

## 2021-07-28 LAB — GLUCOSE, POC
Glucose (POC): 110 mg/dL (ref 65–117)
Glucose (POC): 125 mg/dL — ABNORMAL HIGH (ref 65–117)
Glucose (POC): 142 mg/dL — ABNORMAL HIGH (ref 65–117)
Glucose (POC): 184 mg/dL — ABNORMAL HIGH (ref 65–117)

## 2021-07-28 LAB — EKG, 12 LEAD, INITIAL
Atrial Rate: 137 {beats}/min
Calculated P Axis: 58 degrees
Calculated R Axis: 44 degrees
Calculated T Axis: 81 degrees
P-R Interval: 126 ms
Q-T Interval: 290 ms
QRS Duration: 74 ms
QTC Calculation (Bezet): 437 ms
Ventricular Rate: 137 {beats}/min

## 2021-07-28 LAB — CBC W/O DIFF
ABSOLUTE NRBC: 0 10*3/uL (ref 0.00–0.01)
HCT: 30.9 % — ABNORMAL LOW (ref 35.0–47.0)
HGB: 9.7 g/dL — ABNORMAL LOW (ref 11.5–16.0)
MCH: 28 PG (ref 26.0–34.0)
MCHC: 31.4 g/dL (ref 30.0–36.5)
MCV: 89 FL (ref 80.0–99.0)
MPV: 9.3 FL (ref 8.9–12.9)
NRBC: 0 PER 100 WBC
PLATELET: 183 10*3/uL (ref 150–400)
RBC: 3.47 M/uL — ABNORMAL LOW (ref 3.80–5.20)
RDW: 16.6 % — ABNORMAL HIGH (ref 11.5–14.5)
WBC: 5 10*3/uL (ref 3.6–11.0)

## 2021-07-28 LAB — RESPIRATORY VIRUS PANEL W/COVID-19, PCR
Adenovirus: NOT DETECTED
Adenovirus: NOT DETECTED
B. parapertussis, PCR: NOT DETECTED
BORDETELLA PARAPERTUSSIS PCR, BORPAR: NOT DETECTED
Bordetella pertussis - PCR: NOT DETECTED
Bordetella pertussis by PCR: NOT DETECTED
CORONAVIRUS 229E: NOT DETECTED
CORONAVIRUS HKU1: NOT DETECTED
CORONAVIRUS NL63: NOT DETECTED
CORONAVIRUS OC43: NOT DETECTED
Chlamydophila pneumoniae DNA, QL, PCR: NOT DETECTED
Chlamydophilia pneumoniae by PCR: NOT DETECTED
Coronavirus 229E: NOT DETECTED
Coronavirus CVNL63: NOT DETECTED
Coronavirus HKU1: NOT DETECTED
Coronavirus OC43: NOT DETECTED
INFLUENZA A: NOT DETECTED
INFLUENZA B: NOT DETECTED
Influenza A: NOT DETECTED
Influenza B: NOT DETECTED
Metapneumovirus: NOT DETECTED
Metapneumovirus: NOT DETECTED
Mycoplasma pneumoniae DNA, QL, PCR: NOT DETECTED
Mycoplasma pneumoniae by PCR: NOT DETECTED
PARAINFLUENZA 4: NOT DETECTED
Parainfluenza 1: NOT DETECTED
Parainfluenza 1: NOT DETECTED
Parainfluenza 2: NOT DETECTED
Parainfluenza 2: NOT DETECTED
Parainfluenza 3: NOT DETECTED
Parainfluenza 3: NOT DETECTED
Parainfluenza virus 4: NOT DETECTED
RSV by PCR: NOT DETECTED
RSV by PCR: NOT DETECTED
Rhinovirus Enterovirus PCR: NOT DETECTED
Rhinovirus and Enterovirus: NOT DETECTED
SARS Coronavirus-2: NOT DETECTED
SARS-CoV-2, PCR: NOT DETECTED

## 2021-07-28 LAB — METABOLIC PANEL, COMPREHENSIVE
A-G Ratio: 0.6 — ABNORMAL LOW (ref 1.1–2.2)
ALT (SGPT): 8 U/L — ABNORMAL LOW (ref 12–78)
AST (SGOT): 13 U/L — ABNORMAL LOW (ref 15–37)
Albumin: 2.4 g/dL — ABNORMAL LOW (ref 3.5–5.0)
Alk. phosphatase: 64 U/L (ref 45–117)
Anion gap: 6 mmol/L (ref 5–15)
BUN/Creatinine ratio: 35 — ABNORMAL HIGH (ref 12–20)
BUN: 35 MG/DL — ABNORMAL HIGH (ref 6–20)
Bilirubin, total: 0.7 MG/DL (ref 0.2–1.0)
CO2: 22 mmol/L (ref 21–32)
Calcium: 7.7 MG/DL — ABNORMAL LOW (ref 8.5–10.1)
Chloride: 117 mmol/L — ABNORMAL HIGH (ref 97–108)
Creatinine: 1 MG/DL (ref 0.55–1.02)
GFR est AA: >60 mL/min/{1.73_m2} (ref >60)
GFR est non-AA: 54 mL/min/{1.73_m2} — ABNORMAL LOW (ref >60)
Globulin: 3.8 g/dL (ref 2.0–4.0)
Glucose: 138 mg/dL — ABNORMAL HIGH (ref 65–100)
Potassium: 4 mmol/L (ref 3.5–5.1)
Protein, total: 6.2 g/dL — ABNORMAL LOW (ref 6.4–8.2)
Sodium: 145 mmol/L (ref 136–145)

## 2021-07-28 LAB — VITAMIN B12
Vitamin B-12: 1309 pg/mL — ABNORMAL HIGH (ref 193–986)
Vitamin B12: 1309 pg/mL — ABNORMAL HIGH (ref 193–986)

## 2021-07-28 LAB — AMMONIA
Ammonia, plasma: 23 umol/L (ref <32)
Ammonia: 23 umol/L (ref <32)

## 2021-07-28 LAB — POCT GLUCOSE
POC Glucose: 110 mg/dL (ref 65–117)
POC Glucose: 125 mg/dL — ABNORMAL HIGH (ref 65–117)
POC Glucose: 142 mg/dL — ABNORMAL HIGH (ref 65–117)
POC Glucose: 184 mg/dL — ABNORMAL HIGH (ref 65–117)

## 2021-07-28 LAB — COMPREHENSIVE METABOLIC PANEL
ALT: 8 U/L — ABNORMAL LOW (ref 12–78)
AST: 13 U/L — ABNORMAL LOW (ref 15–37)
Albumin/Globulin Ratio: 0.6 — ABNORMAL LOW (ref 1.1–2.2)
Albumin: 2.4 g/dL — ABNORMAL LOW (ref 3.5–5.0)
Alkaline Phosphatase: 64 U/L (ref 45–117)
Anion Gap: 6 mmol/L (ref 5–15)
BUN: 35 MG/DL — ABNORMAL HIGH (ref 6–20)
Bun/Cre Ratio: 35 — ABNORMAL HIGH (ref 12–20)
CO2: 22 mmol/L (ref 21–32)
Calcium: 7.7 MG/DL — ABNORMAL LOW (ref 8.5–10.1)
Chloride: 117 mmol/L — ABNORMAL HIGH (ref 97–108)
Creatinine: 1 MG/DL (ref 0.55–1.02)
EGFR IF NonAfrican American: 54 mL/min/{1.73_m2} — ABNORMAL LOW (ref >60)
GFR African American: >60 mL/min/{1.73_m2} (ref >60)
Globulin: 3.8 g/dL (ref 2.0–4.0)
Glucose: 138 mg/dL — ABNORMAL HIGH (ref 65–100)
Potassium: 4 mmol/L (ref 3.5–5.1)
Sodium: 145 mmol/L (ref 136–145)
Total Bilirubin: 0.7 MG/DL (ref 0.2–1.0)
Total Protein: 6.2 g/dL — ABNORMAL LOW (ref 6.4–8.2)

## 2021-07-28 LAB — CBC
Hematocrit: 30.9 % — ABNORMAL LOW (ref 35.0–47.0)
Hemoglobin: 9.7 g/dL — ABNORMAL LOW (ref 11.5–16.0)
MCH: 28 PG (ref 26.0–34.0)
MCHC: 31.4 g/dL (ref 30.0–36.5)
MCV: 89 FL (ref 80.0–99.0)
MPV: 9.3 FL (ref 8.9–12.9)
NRBC Absolute: 0 10*3/uL (ref 0.00–0.01)
Nucleated RBCs: 0 PER 100 WBC
Platelets: 183 10*3/uL (ref 150–400)
RBC: 3.47 M/uL — ABNORMAL LOW (ref 3.80–5.20)
RDW: 16.6 % — ABNORMAL HIGH (ref 11.5–14.5)
WBC: 5 10*3/uL (ref 3.6–11.0)

## 2021-07-28 LAB — EKG 12-LEAD
Atrial Rate: 137 {beats}/min
P Axis: 58 degrees
P-R Interval: 126 ms
Q-T Interval: 290 ms
QRS Duration: 74 ms
QTc Calculation (Bazett): 437 ms
R Axis: 44 degrees
T Axis: 81 degrees
Ventricular Rate: 137 {beats}/min

## 2021-07-28 MED ORDER — ENOXAPARIN 40 MG/0.4 ML SUB-Q SYRINGE
40 mg/0.4 mL | Freq: Every day | SUBCUTANEOUS | Status: DC
Start: 2021-07-28 — End: 2021-08-04
  Administered 2021-07-29 – 2021-08-04 (×7): via SUBCUTANEOUS

## 2021-07-28 MED ORDER — SODIUM CHLORIDE 0.9% BOLUS IV
0.9 % | Freq: Once | INTRAVENOUS | Status: AC
Start: 2021-07-28 — End: 2021-07-27
  Administered 2021-07-28: via INTRAVENOUS

## 2021-07-28 MED FILL — LEVOTHYROXINE 50 MCG TAB: 50 mcg | ORAL | Qty: 1

## 2021-07-28 MED FILL — SERTRALINE 50 MG TAB: 50 mg | ORAL | Qty: 1

## 2021-07-28 MED FILL — TOPIRAMATE 25 MG TAB: 25 mg | ORAL | Qty: 1

## 2021-07-28 MED FILL — METOPROLOL TARTRATE 25 MG TAB: 25 mg | ORAL | Qty: 1

## 2021-07-28 MED FILL — INSULIN LISPRO 100 UNIT/ML INJECTION: 100 unit/mL | SUBCUTANEOUS | Qty: 1

## 2021-07-28 MED FILL — SODIUM CHLORIDE 0.9 % IV: INTRAVENOUS | Qty: 1000

## 2021-07-28 MED FILL — BRIVIACT 25 MG TABLET: 25 mg | ORAL | Qty: 2

## 2021-07-28 MED FILL — INSULIN LISPRO 100 UNIT/ML INJECTION: 100 unit/mL | SUBCUTANEOUS | Qty: 2

## 2021-07-28 MED FILL — ENOXAPARIN 30 MG/0.3 ML SUB-Q SYRINGE: 30 mg/0.3 mL | SUBCUTANEOUS | Qty: 0.3

## 2021-07-28 MED FILL — ACETAMINOPHEN 325 MG TABLET: 325 mg | ORAL | Qty: 2

## 2021-07-28 MED FILL — ATORVASTATIN 10 MG TAB: 10 mg | ORAL | Qty: 1

## 2021-07-28 MED FILL — PANTOPRAZOLE 40 MG TAB, DELAYED RELEASE: 40 mg | ORAL | Qty: 1

## 2021-07-28 MED FILL — IPRATROPIUM-ALBUTEROL 2.5 MG-0.5 MG/3 ML NEB SOLUTION: 2.5 mg-0.5 mg/3 ml | RESPIRATORY_TRACT | Qty: 3

## 2021-07-28 NOTE — Progress Notes (Signed)
Bedside shift change report given to [place] Control and instrumentation engineer) by Alinda Money (offgoing nurse). Report included the following information SBAR, Kardex, MAR, Med Rec Status, and Cardiac Rhythm NSR/Sinus Tachy .     1100: lab called from [facility] confirming that Savannah Irwin is covid positive. Precautions already in place.

## 2021-07-28 NOTE — Progress Notes (Signed)
Hospitalist Progress Note              Daisy Floro, MD.                                                             Cell: [phone]                               NAME:  Savannah Irwin  DOB:  02-01-1946  MRN:  [id number]  Date of Service:  07/28/2021    Summary: 75 y.o. female with past medical history of hypothyroidism, anxiety, depression, emphesema, who presented on 07/27/2021 with unresponsiveness.       Assessment/Plan:  Unresponsiveness: Unclear etiology but probably due to seizures Vs stroke vs other  CT scan head atrophy  and chronic small vessel disease. No intracranial findings  Brain MRI pending to r/o stroke  F/up on EEG to rule out seizures  Appreciate evaluation by neurology  Continue briviact, topamax  Patient is currently awake and verbally responsive    SIRS/Sepsis  Meets criteria- Fever, tachycardia  U/A and CXR negative  Likely due to COVID  Check procalcitonin  Hold off antibiotics    ARF (acute renal failure): Scr on admission 1.58  Likely due to IVVD  Continue IVF  Now resolved  CK 40     Type 2 diabetes mellitus (HCC).  Diet controlled.  Continue sliding scale       Anxiety and depression.  Continue Topamax     Essential hypertension: On metoprolol      Emphysema lung (HCC).  Not bronchospastic.  DuoNeb as needed     Mixed hyperlipidemia (03/22/2021).  Resume statin     Hypothyroidism (06/21/2021).  Continue levothyroxine     Tachycardia (07/27/2021).  Likely due to dehydration/fever.  Continue IV fluid  Resolved    COVID 19 infection  PT/OT consult    Anemia         Code status: full  DVT prophylaxsis: lovenox  Dispo: pending clinical improvement         Interval History/Subjective:  F/up for unresponsiveness  Awake and verbally responsive  Poor historian    Review of Systems:  A comprehensive review of systems was negative except for that written in the HPI.      Objective:     VITALS:   Last 24hrs VS reviewed since prior progress note. Most  recent are:  Visit Vitals  BP 122/68 (BP 1 Location: Left lower arm, BP Patient Position: At rest)   Pulse 98   Temp 98.3 ??F (36.8 ??C)   Resp 22   Ht 5\' 3"  (1.6 m)   Wt 57.2 kg (126 lb)   SpO2 94%   BMI 22.32 kg/m??       Intake/Output Summary (Last 24 hours) at 07/28/2021 1159  Last data filed at 07/28/2021 0301  Gross per 24 hour   Intake 1680 ml   Output --   Net 1680 ml        PHYSICAL EXAM:  General: No acute distress, cooperative, pleasant   EENT: EOMI. Anicteric sclerae. Oral mucous moist, oropharynx benign  Resp: CTA bilaterally. No wheezing/rhonchi/rales. No accessory muscle use  CV: Regular rhythm, normal rate, no murmurs, gallops, rubs  GI:  Soft, non distended, non tender. normoactive bowel sounds, no hepatosplenomegaly Extremities: No edema, warm, 2+ pulses throughout  Neurologic: Moves all extremities.  AAOx3, CN II-XII grossly intact  Psych: Good insight. Not anxious nor agitated.  Skin: Good Turgor, no rashes or ulcers    Lab Data Personally Reviewed: (see below)     Medications list Personally Reviewed:  x YES  NO     _______________________________________________________________________  Care Plan discussed with:  Patient/Family, Nurse, and Case Manager    Total NON critical care TIME:  30 minutes    Roderic Scarce, MD     Procedures: see electronic medical records for all procedures/Xrays and details which were not copied into this note but were reviewed prior to creation of Plan.      LABS:  Recent Labs     07/28/21  0244 07/27/21  1619   WBC 5.0 8.0   HGB 9.7* 12.7   HCT 30.9* 39.8   PLT 183 228     Recent Labs     07/28/21  0244 07/27/21  1619   NA 145 137   K 4.0 4.2   CL 117* 107   CO2 22 22   BUN 35* 49*   CREA 1.00 1.58*   GLU 138* 238*   CA 7.7* 9.6     Recent Labs     07/28/21  0244 07/27/21  1619   ALT 8* 10*   AP 64 92   TBILI 0.7 1.1*   TP 6.2* 9.4*   ALB 2.4* 3.3*   GLOB 3.8 6.1*     No results for input(s): INR, PTP, APTT, INREXT in the last 72 hours.   No results for input(s): FE,  TIBC, PSAT, FERR in the last 72 hours.   Lab Results   Component Value Date/Time    Folate 5.5 02/07/2021 02:26 PM      No results for input(s): PH, PCO2, PO2 in the last 72 hours.  Recent Labs     07/27/21  1619   CPK 40     Lab Results   Component Value Date/Time    Cholesterol, total 126 06/21/2021 11:18 AM    HDL Cholesterol 43 06/21/2021 11:18 AM    LDL, calculated 66 06/21/2021 11:18 AM    Triglyceride 85 06/21/2021 11:18 AM     Lab Results   Component Value Date/Time    Glucose (POC) 142 (H) 07/28/2021 11:18 AM    Glucose (POC) 125 (H) 07/28/2021 05:59 AM    Glucose (POC) 184 (H) 07/27/2021 08:09 PM     Lab Results   Component Value Date/Time    Color YELLOW/STRAW 07/27/2021 05:46 PM    Appearance CLEAR 07/27/2021 05:46 PM    Specific gravity 1.018 07/27/2021 05:46 PM    pH (UA) 5.0 07/27/2021 05:46 PM    Protein 300 (A) 07/27/2021 05:46 PM    Glucose Negative 07/27/2021 05:46 PM    Ketone 15 (A) 07/27/2021 05:46 PM    Bilirubin Negative 07/27/2021 05:46 PM    Urobilinogen 1.0 07/27/2021 05:46 PM    Nitrites Negative 07/27/2021 05:46 PM    Leukocyte Esterase Negative 07/27/2021 05:46 PM    Epithelial cells FEW 07/27/2021 05:46 PM    Bacteria Negative 07/27/2021 05:46 PM    WBC 0-4 07/27/2021 05:46 PM    RBC 0-5 07/27/2021 05:46 PM

## 2021-07-28 NOTE — Progress Notes (Signed)
Physical Therapy Note:    Orders acknowledged, chart reviewed, PT evaluation held. Pt admitted 07/27/21 after being found unresponsive. Pt on bedrest per chart.     Will continue to follow and proceed with PT evaluation when pt cleared for activity and appropriate for PT interventions.    Junie Spencer, PT, DPT, Ernestene Mention

## 2021-07-28 NOTE — Progress Notes (Signed)
Physician Progress Note      PATIENTCONNEE, IKNER  CSN #:                  [id number]  DOB:                       07-24-1946  ADMIT DATE:       07/27/2021 3:22 PM  DISCH DATE:  RESPONDING  PROVIDER #:        Zsofia Prout Esmeralda Links MD        QUERY TEXT:    Type of Encephalopathy: Please provide further specificity, if known.    Clinical indicators include: acute, encephalopathy, fever, alcohol use, alcohol, cute, infection, ever  Options provided:  -- Anoxic/hypoxic encephalopathy  -- Metabolic encephalopathy  -- Toxic encephalopathy  -- Hepatic encephalopathy  -- Hypertensive encephalopathy  -- Other - I will add my own diagnosis  -- Disagree - Not applicable / Not valid  -- Disagree - Clinically Unable to determine / Unknown        PROVIDER RESPONSE TEXT:    The patient has metabolic encephalopathy.      Electronically signed by:  Cleotis Nipper MD 07/28/2021 1:46 PM

## 2021-07-28 NOTE — Progress Notes (Signed)
This patient was assisted with Intentional Toileting every 2 hours during this shift as appropriate.  Documentation of ambulation and output reflected on Flowsheet as appropriate.  Purposeful hourly rounding was completed using AIDET and 5Ps.  Outcomes of PHR documented as they occurred. Bed alarm in use as appropriate.  Dual Suction and ambubag in place.

## 2021-07-28 NOTE — Progress Notes (Signed)
 0240  TRANSFER - IN REPORT:    Verbal report received from Samantha, RN(name) on Savannah Irwin  being received from ED (unit) for routine progression of care      Report consisted of patient's Situation, Background, Assessment and   Recommendations(SBAR).     Information from the following report(s) SBAR, Kardex, Intake/Output, MAR, Recent Results, and Cardiac Rhythm NSR  was reviewed with the receiving nurse.    Opportunity for questions and clarification was provided.      Assessment completed upon patient's arrival to unit and care assumed.     0301: pt arrived to unit alert and oriented. Transferred to bed from stretcher. Left sided facial droop and left sided weakness/rigidity noted from previous CVA. CHG bath and dual skin complete. Admission questions complete, unable to complete PTA medication list at this time as pt is unsure of her medications. Asked pt to have family member bring in a list and she stated that she would try to get in contact with someone. Pt voicing no complaints at this time.     This patient was assisted with Intentional Toileting every 2 hours during this shift as appropriate.  Documentation of ambulation and output reflected on Flowsheet as appropriate.  Purposeful hourly rounding was completed using AIDET and 5Ps.  Outcomes of PHR documented as they occurred. Bed alarm in use as appropriate.  Dual Suction and ambubag in place.     0700  Bedside and Verbal shift change report given to Koren, RN (oncoming nurse) by Tinnie, RN (offgoing nurse). Report included the following information SBAR, Kardex, Intake/Output, MAR, Recent Results, and Cardiac Rhythm NSR .

## 2021-07-28 NOTE — Progress Notes (Signed)
 [facility] Dosing Services: 07/28/2021    Enoxaparin 75 y.o. female, for prophylaxis of  DVT.  Wt Readings from Last 1 Encounters:   07/28/21 57.2 kg (126 lb)       Ht Readings from Last 1 Encounters:   07/27/21 160 cm (63)     Previous Dose 30 mg daily   Creatinine Clearance Estimated Creatinine Clearance: 39.6 mL/min (based on SCr of 1 mg/dL).    Creatinine Lab Results   Component Value Date/Time    Creatinine 1.00 07/28/2021 02:44 AM       Platelet Lab Results   Component Value Date/Time    PLATELET 183 07/28/2021 02:44 AM      H/H Lab Results   Component Value Date/Time    HGB 9.7 (L) 07/28/2021 02:44 AM        Pharmacist made change to enoxaparin therapy based on:  [ X ] Weight/Renal function: dose changed to:40 mg daily        51-100 kg  eCrCl 30 or greater    Pharmacy to monitor patient's progress.  Will make dose adjustment as needed per changing renal function.  Will communicate further recommendations regarding patient's anticoagulation therapy with prescriber.    Signed Caryn A Defraia . Contact information: [phone]

## 2021-07-28 NOTE — Progress Notes (Signed)
 Problem: Mobility Impaired (Adult and Pediatric)  Goal: *Acute Goals and Plan of Care (Insert Text)  Description: FUNCTIONAL STATUS PRIOR TO ADMISSION: Pt states independent baseline ambulation within the home and mod I gait using cane in community. Per care manager, niece reports pt requires 24-hour supervision/assist for safety but not necessarily physical assistance.    HOME SUPPORT PRIOR TO ADMISSION: The patient lived with niece and niece's daughter. (Confirmed by care manager.) She reports son also provides intermittent assistance.    Physical Therapy Goals  Initiated 07/28/2021  1.  Patient will move from supine to sit and sit to supine  in bed with moderate assistance  within 7 day(s).    2.  Patient will transfer from bed to chair and chair to bed with moderate assistance  using the least restrictive device within 7 day(s).  3.  Patient will perform sit to stand with moderate assistance  within 7 day(s).  4.  Patient will ambulate with moderate assistance  for 15 feet with the least restrictive device within 7 day(s).       Outcome: Not Met     PHYSICAL THERAPY EVALUATION  Patient: Savannah Irwin (75 y.o. female)  Date: 07/28/2021  Primary Diagnosis: Fever [R50.9]       Precautions:   Fall, Skin (droplet +)    ASSESSMENT  Based on the objective data described below, the patient presents with severe pain all over, decreased AROM and tolerance to PROM due to said pain, decreased strength, very poor activity tolerance, and limited functional mobility on day 1 of admission after being found down and unresponsive at home. Pt reports independent household ambulation at baseline. She states she recalls impaired LUE AROM and strength while in restroom but retains no memories of following events. Care manager reports pt required 24-hour supervision from family prior to admission and level of care required exceeds family's abilities. Brain MRI negative for stroke. Pt performs bed mobility with total A today with poor  tolerance due to severe pain all over. Attempted bed in chair position to assess for pt tolerance - she tolerates HOB 45 degrees and states inability to tolerate further repositioning due to pain. Requested OT consult from attending provider via perfect serve.    Current Level of Function Impacting Discharge (mobility/balance): total A    Functional Outcome Measure:  The patient scored 10 on the Barthel outcome measure which is indicative of very dependent status.      Other factors to consider for discharge: none additional     Patient will benefit from skilled therapy intervention to address the above noted impairments.       PLAN :  Recommendations and Planned Interventions: bed mobility training, transfer training, gait training, therapeutic exercises, neuromuscular re-education, patient and family training/education, and therapeutic activities      Frequency/Duration: Patient will be followed by physical therapy:  5 times a week to address goals.    Recommendation for discharge: (in order for the patient to meet his/her long term goals)  Therapy up to 5 days/week in SNF setting    This discharge recommendation:  Has been made in collaboration with the attending provider and/or case management    IF patient discharges home will need the following DME: stretcher transport, 24-hour assistance, HHPT         SUBJECTIVE:   Patient stated "I don't know what happened."    Pt received supine, agreeable to PT and cleared by RN.    OBJECTIVE DATA SUMMARY:   HISTORY:  Past Medical History:   Diagnosis Date    Acquired hypothyroidism     Anxiety and depression     Emphysema lung (HCC)     Emphysema lung (HCC)     Fibromyalgia     Gout     Graves disease     Graves' disease     Heart failure with preserved ejection fraction (HCC)     Hemorrhagic shock (HCC) 02/05/2021    Pulmonary hypertension [facility])     Per records from [facility], patient has a history of severe pulmonary hypertension as admission of  February, 2022.  Previously, echocardiogram of 11/05/2016 at [facility] showed mild pulmonary hypertension, normal calculated LVEF of 63%, mild tricuspid regurgitation and trace mitral regurgitation.    Type 2 diabetes mellitus (HCC)     Underweight     Upper GI bleed 02/07/2021     Past Surgical History:   Procedure Laterality Date    COLONOSCOPY N/A 02/09/2021    COLONOSCOPY performed by Geary Deitra GRADE, MD at [facility] ENDOSCOPY    HX CHOLECYSTECTOMY  1972    HX HYSTERECTOMY         Personal factors and/or comorbidities impacting plan of care: as above    Home Situation  Home Environment: Apartment  # Steps to Enter: 14  One/Two Story Residence: One story  Living Alone: No  Support Systems: Other (Comment) (niece and niece's daughter; son occasionally)  Patient Expects to be Discharged to:: Unable to determine at this time  Current DME Used/Available at Home: Cane, straight, walker, rolling; walker, rollator    EXAMINATION/PRESENTATION/DECISION MAKING:   Critical Behavior:  Neurologic State: Alert  Orientation Level: Oriented X4  Cognition: Follows 50% simple commands (unclear if limited by command-following or willigness/pain); decreased insight into deficits and need for assistance     Hearing:  Auditory  Auditory Impairment: None  Skin: LE exposed skin intact  Edema: LE exposed skin intact  Range Of Motion:  AROM: Grossly decreased, non-functional      Rests with LLE in neutral, R hip ER, R knee flexion; states unable to obtain RLE to neutral due to arthritis pain; unable to tolerate PROM of RLE.     PROM: Grossly decreased, non-functional      Demonstrates spontaneous AROM RUE; resists PROM/AROM LUE stating severe pain.      Strength:    Strength: Grossly decreased, non-functional      Demonstrates 2/5 L hip flexion; at least 2+/5 B ankle dorsiflexion; does not follow commands/perform motions necessary for MMT at remainder of LE joints.              Tone & Sensation:       Normal LE tone                            Coordination:  Coordination: Generally decreased, functional       Functional Mobility:  Bed Mobility:  Rolling: Total assistance  Supine to Sit: Total assistance (tolerates only partial bed to chair)        Transfers:      Infer total A; pt unable to tolerate today                       Balance:    N/T  Ambulation/Gait Training:         N/T  Therapeutic Exercises:   Ankle pumps x 10    Functional Measure:  Barthel Index:    Bathing: 0  Bladder: 5  Bowels: 5  Grooming: 0  Dressing: 0  Feeding: 0  Mobility: 0  Stairs: 0  Toilet Use: 0  Transfer (Bed to Chair and Back): 0  Total: 10/100       The Barthel ADL Index: Guidelines  1. The index should be used as a record of what a patient does, not as a record of what a patient could do.  2. The main aim is to establish degree of independence from any help, physical or verbal, however minor and for whatever reason.  3. The need for supervision renders the patient not independent.  4. A patient's performance should be established using the best available evidence. Asking the patient, friends/relatives and nurses are the usual sources, but direct observation and common sense are also important. However direct testing is not needed.  5. Usually the patient's performance over the preceding 24-48 hours is important, but occasionally longer periods will be relevant.  6. Middle categories imply that the patient supplies over 50 per cent of the effort.  7. Use of aids to be independent is allowed.    Score Interpretation (from Sinoff 1997)   80-100 Independent   60-79 Minimally independent   40-59 Partially dependent   20-39 Very dependent   <20 Totally dependent     -Mahoney, F.l., Barthel, D.W. (1965). Functional evaluation: the Barthel Index. [place] [address] [place] (14)2.  -Sinoff, G., Ore, L. (1997). The Barthel activities of daily living index: self-reporting versus actual performance in the old (> or = 75 years). Journal of  American Geriatric Society 45(7), [phone].   -Fleeta cotton May, J.J.M.F, Orman ROES., Oneita MERYL Sebastian DELORSE. (1999). Measuring the change in disability after inpatient rehabilitation; comparison of the responsiveness of the Barthel Index and Functional Independence Measure. Journal of Neurology, Neurosurgery, and Psychiatry, 66(4), [phone].  Marea Chillington, N.J.A, [place]te [place],  W.J.M, & Koopmanschap, M.A. (2004) Assessment of post-stroke quality of life in cost-effectiveness studies: The usefulness of the Barthel Index and the EuroQoL-5D. Quality of Life Research, 66, 572-56            Physical Therapy Evaluation Charge Determination   History Examination Presentation Decision-Making   HIGH Complexity :3+ comorbidities / personal factors will impact the outcome/ POC  HIGH Complexity : 4+ Standardized tests and measures addressing body structure, function, activity limitation and / or participation in recreation  HIGH Complexity : Unstable and unpredictable characteristics  Other outcome measures Barthel  HIGH       Based on the above components, the patient evaluation is determined to be of the following complexity level: HIGH     Pain Rating:  States severe pain; does not rate. Discussed with RN.    Activity Tolerance:   Poor    After treatment patient left in no apparent distress:   Supine in bed, Call bell within reach, Bed / chair alarm activated, and Side rails x 3    COMMUNICATION/EDUCATION:   The patient's plan of care was discussed with: Registered nurse and Case management.     Fall prevention education was provided and the patient/caregiver indicated understanding., Patient/family have participated as able in goal setting and plan of care., and Patient/family agree to work toward stated goals and plan of care.    Thank you for this referral.  Suzen FORBES Marina, PT, DPT   Time Calculation: 30 mins

## 2021-07-28 NOTE — Consults (Signed)
NEUROLOGY IN-PATIENT NEW CONSULTATION      07/28/2021    RE: Savannah Irwin         02/18/1946      REFERRED BY:  Buzzy Han, MD      CHIEF COMPLAINT:  This is Savannah Irwin is a 75 y.o. female who had concerns including Altered mental status.    HPI:     Limited history.    Patient found on the ground unresponsive. In ER, noted to have fever and acute renal faiulre. (+) COVID    Has L sided weakness which she states is new. Patient denies having a stroke before.    (+) neck pain     (+) one time seizure event seen in [place] taking Briviact.    ROS  (-) fever  (-) rash  All other systems reviewed and are negative    Past Medical Hx  Past Medical History:   Diagnosis Date    Acquired hypothyroidism     Anxiety and depression     Emphysema lung ([place])     Emphysema lung ([place])     Fibromyalgia     Gout     Graves disease     Graves' disease     Heart failure with preserved ejection fraction ([place])     Hemorrhagic shock ([place]) 02/05/2021    Pulmonary hypertension [facility])     Per records from [facility], patient has a history of severe pulmonary hypertension as admission of February, 2022.  Previously, echocardiogram of 11/05/2016 at [facility] showed mild pulmonary hypertension, normal calculated LVEF of 63%, mild tricuspid regurgitation and trace mitral regurgitation.    Type 2 diabetes mellitus (HCC)     Underweight     Upper GI bleed 02/07/2021       Social Hx  Social History     Socioeconomic History    Marital status: SINGLE   Tobacco Use    Smoking status: Every Day     Packs/day: 0.50     Types: Cigarettes    Smokeless tobacco: Never    Tobacco comments:     Patient stated that she started smoking at the age of 78.   Vaping Use    Vaping Use: Never used   Substance and Sexual Activity    Alcohol use: Not Currently     Comment: Patient denies any history of excessive alcohol intake.    Drug use: Never       Family Hx  Family History   Problem Relation Age of Onset    OSTEOARTHRITIS  Mother     Diabetes Mother     Hypertension Mother     Elevated Lipids Sister     Hypertension Sister     Thyroid Disease Sister         Berenice Primas' disease       ALLERGIES  Allergies   Allergen Reactions    Aluminum Rash     Discoloration to skin    Penicillins Itching       CURRENT MEDS  Current Facility-Administered Medications   Medication Dose Route Frequency Provider Last Rate Last Admin    sodium chloride (NS) flush 5-10 mL  5-10 mL IntraVENous PRN Orgill, Imani A, MD        levothyroxine (SYNTHROID) tablet 50 mcg  50 mcg Oral ACB Tefera, Mesfin, MD   50 mcg at 07/28/21 0601    metoprolol tartrate (LOPRESSOR) tablet 25 mg  25 mg Oral BID Tefera,  Mesfin, MD   25 mg at 07/27/21 2020    pantoprazole (PROTONIX) tablet 40 mg  40 mg Oral DAILY Servando Salina, Mesfin, MD   40 mg at 07/28/21 0601    sertraline (ZOLOFT) tablet 50 mg  50 mg Oral DAILY Tefera, Mesfin, MD        topiramate (TOPAMAX) tablet 25 mg  25 mg Oral BID WITH MEALS Tefera, Mesfin, MD        brivaracetam (BRIVIACT) tablet 50 mg  50 mg Oral BID Tefera, Mesfin, MD   50 mg at 07/27/21 2020    atorvastatin (LIPITOR) tablet 10 mg  10 mg Oral DAILY Tefera, Mesfin, MD        sodium chloride (NS) flush 5-40 mL  5-40 mL IntraVENous Q8H Tefera, Mesfin, MD   10 mL at 07/27/21 2200    sodium chloride (NS) flush 5-40 mL  5-40 mL IntraVENous PRN Tefera, Mesfin, MD        acetaminophen (TYLENOL) tablet 650 mg  650 mg Oral Q6H PRN Servando Salina, Mesfin, MD   650 mg at 07/28/21 0421    Or    acetaminophen (TYLENOL) suppository 650 mg  650 mg Rectal Q6H PRN Tefera, Mesfin, MD        polyethylene glycol (MIRALAX) packet 17 g  17 g Oral DAILY PRN Tefera, Mesfin, MD        ondansetron (ZOFRAN ODT) tablet 4 mg  4 mg Oral Q8H PRN Tefera, Mesfin, MD        Or    ondansetron (ZOFRAN) injection 4 mg  4 mg IntraVENous Q6H PRN Tefera, Mesfin, MD        enoxaparin (LOVENOX) injection 30 mg  30 mg SubCUTAneous DAILY Tefera, Mesfin, MD        0.9% sodium chloride infusion  125 mL/hr IntraVENous  CONTINUOUS Eniola, Razaak A, MD 125 mL/hr at 07/28/21 0601 125 mL/hr at 07/28/21 0601    albuterol-ipratropium (DUO-NEB) 2.5 MG-0.5 MG/3 ML  3 mL Nebulization Q4H PRN Tefera, Mesfin, MD   3 mL at 07/27/21 2040    insulin lispro (HUMALOG) injection   SubCUTAneous Q6H Tefera, Mesfin, MD   2 Units at 07/27/21 2022    glucose chewable tablet 16 g  4 Tablet Oral PRN Tefera, Mesfin, MD        glucagon (GLUCAGEN) injection 1 mg  1 mg IntraMUSCular PRN Tefera, Mesfin, MD        dextrose 10% infusion 0-250 mL  0-250 mL IntraVENous PRN Tefera, Mesfin, MD               PREVIOUS WORKUP: (reviewed)  IMAGING:    CT Results (recent):  Results from Hospital Encounter encounter on 07/27/21    CT SPINE CERV WO CONT    Narrative  EXAM:  CT CERVICAL SPINE WITHOUT CONTRAST    INDICATION: Neck pain after fall.    COMPARISON: None.    CONTRAST:  None.    TECHNIQUE: Multislice helical CT of the cervical spine was performed without  intravenous contrast administration.  Sagittal and coronal reformats were  generated.  CT dose reduction was achieved through use of a standardized  protocol tailored for this examination and automatic exposure control for dose  modulation.    FINDINGS:    The alignment is within normal limits. There is no fracture or compression  deformity. The odontoid process is intact. The craniocervical junction is within  normal limits. Partially calcified pannus posterior to the odontoid process  causes mild central spinal canal  stenosis at C1-C2. Multilevel disc desiccation  is moderate at C6-C7.    Carotid bifurcation calcific atherosclerosis is visible. Apical centrilobular  emphysema is partially evaluated.    C2-C3: No stenosis..    C3-C4: No stenosis.    C4-C5: Moderate central spinal canal stenosis.    C5-C6: Mild central spinal canal stenosis.    C6-C7: Mild central spinal canal stenosis.    C7-T1: There is no spinal canal or neural foraminal stenosis.    Impression  1. No fracture.  2. C4-C5 moderate central  spinal canal stenosis.      MRI Results (recent):  No results found for this or any previous visit.      IR Results (recent):  No results found for this or any previous visit.      VAS/US Results (recent):  No results found for this or any previous visit.          LABS (reviewed)  Results for orders placed or performed during the hospital encounter of 07/27/21   CULTURE, BLOOD    Specimen: Blood   Result Value Ref Range    Special Requests: NO SPECIAL REQUESTS      Culture result: NO GROWTH AFTER 12 HOURS     COVID-19 RAPID TEST   Result Value Ref Range    Specimen source Nasopharyngeal      COVID-19 rapid test Detected (A) NOTD     RESPIRATORY VIRUS PANEL W/COVID-19, PCR    Specimen: Nasopharyngeal   Result Value Ref Range    Adenovirus Not detected NOTD      Coronavirus 229E Not detected NOTD      Coronavirus HKU1 Not detected NOTD      Coronavirus CVNL63 Not detected NOTD      Coronavirus OC43 Not detected NOTD      SARS-CoV-2, PCR Not detected NOTD      Metapneumovirus Not detected NOTD      Rhinovirus and Enterovirus Not detected NOTD      Influenza A Not detected NOTD      Influenza B Not detected NOTD      Parainfluenza 1 Not detected NOTD      Parainfluenza 2 Not detected NOTD      Parainfluenza 3 Not detected NOTD      Parainfluenza virus 4 Not detected NOTD      RSV by PCR Not detected NOTD      B. parapertussis, PCR Not detected NOTD      Bordetella pertussis - PCR Not detected NOTD      Chlamydophila pneumoniae DNA, QL, PCR Not detected NOTD      Mycoplasma pneumoniae DNA, QL, PCR Not detected NOTD     URINALYSIS W/ REFLEX CULTURE    Specimen: Urine   Result Value Ref Range    Color YELLOW/STRAW      Appearance CLEAR CLEAR      Specific gravity 1.018 1.003 - 1.030      pH (UA) 5.0 5.0 - 8.0      Protein 300 (A) NEG mg/dL    Glucose Negative NEG mg/dL    Ketone 15 (A) NEG mg/dL    Bilirubin Negative NEG      Blood TRACE (A) NEG      Urobilinogen 1.0 0.2 - 1.0 EU/dL    Nitrites Negative NEG      Leukocyte  Esterase Negative NEG      WBC 0-4 0 - 4 /hpf    RBC 0-5 0 - 5 /hpf  Epithelial cells FEW FEW /lpf    Bacteria Negative NEG /hpf    UA:UC IF INDICATED CULTURE NOT INDICATED BY UA RESULT CNI     METABOLIC PANEL, COMPREHENSIVE   Result Value Ref Range    Sodium 137 136 - 145 mmol/L    Potassium 4.2 3.5 - 5.1 mmol/L    Chloride 107 97 - 108 mmol/L    CO2 22 21 - 32 mmol/L    Anion gap 8 5 - 15 mmol/L    Glucose 238 (H) 65 - 100 mg/dL    BUN 49 (H) 6 - 20 MG/DL    Creatinine 1.58 (H) 0.55 - 1.02 MG/DL    BUN/Creatinine ratio 31 (H) 12 - 20      GFR est AA 39 (L) >60 ml/min/1.63m    GFR est non-AA 32 (L) >60 ml/min/1.777m   Calcium 9.6 8.5 - 10.1 MG/DL    Bilirubin, total 1.1 (H) 0.2 - 1.0 MG/DL    ALT (SGPT) 10 (L) 12 - 78 U/L    AST (SGOT) 12 (L) 15 - 37 U/L    Alk. phosphatase 92 45 - 117 U/L    Protein, total 9.4 (H) 6.4 - 8.2 g/dL    Albumin 3.3 (L) 3.5 - 5.0 g/dL    Globulin 6.1 (H) 2.0 - 4.0 g/dL    A-G Ratio 0.5 (L) 1.1 - 2.2     CBC WITH AUTOMATED DIFF   Result Value Ref Range    WBC 8.0 3.6 - 11.0 K/uL    RBC 4.61 3.80 - 5.20 M/uL    HGB 12.7 11.5 - 16.0 g/dL    HCT 39.8 35.0 - 47.0 %    MCV 86.3 80.0 - 99.0 FL    MCH 27.5 26.0 - 34.0 PG    MCHC 31.9 30.0 - 36.5 g/dL    RDW 16.7 (H) 11.5 - 14.5 %    PLATELET 228 150 - 400 K/uL    MPV 9.1 8.9 - 12.9 FL    NRBC 0.0 0 PER 100 WBC    ABSOLUTE NRBC 0.00 0.00 - 0.01 K/uL    NEUTROPHILS 73 32 - 75 %    LYMPHOCYTES 19 12 - 49 %    MONOCYTES 7 5 - 13 %    EOSINOPHILS 0 0 - 7 %    BASOPHILS 0 0 - 1 %    IMMATURE GRANULOCYTES 1 (H) 0.0 - 0.5 %    ABS. NEUTROPHILS 5.9 1.8 - 8.0 K/UL    ABS. LYMPHOCYTES 1.5 0.8 - 3.5 K/UL    ABS. MONOCYTES 0.6 0.0 - 1.0 K/UL    ABS. EOSINOPHILS 0.0 0.0 - 0.4 K/UL    ABS. BASOPHILS 0.0 0.0 - 0.1 K/UL    ABS. IMM. GRANS. 0.0 0.00 - 0.04 K/UL    DF AUTOMATED     CK   Result Value Ref Range    CK 40 26 - 192 U/L   TSH 3RD GENERATION   Result Value Ref Range    TSH 0.61 0.36 - 3.74 uIU/mL   SAMPLES BEING HELD   Result Value Ref Range     SAMPLES BEING HELD RED.BLU     COMMENT        Add-on orders for these samples will be processed based on acceptable specimen integrity and analyte stability, which may vary by analyte.   METABOLIC PANEL, COMPREHENSIVE   Result Value Ref Range    Sodium 145 136 - 145 mmol/L  Potassium 4.0 3.5 - 5.1 mmol/L    Chloride 117 (H) 97 - 108 mmol/L    CO2 22 21 - 32 mmol/L    Anion gap 6 5 - 15 mmol/L    Glucose 138 (H) 65 - 100 mg/dL    BUN 35 (H) 6 - 20 MG/DL    Creatinine 1.00 0.55 - 1.02 MG/DL    BUN/Creatinine ratio 35 (H) 12 - 20      GFR est AA >60 >60 ml/min/1.72m    GFR est non-AA 54 (L) >60 ml/min/1.754m   Calcium 7.7 (L) 8.5 - 10.1 MG/DL    Bilirubin, total 0.7 0.2 - 1.0 MG/DL    ALT (SGPT) 8 (L) 12 - 78 U/L    AST (SGOT) 13 (L) 15 - 37 U/L    Alk. phosphatase 64 45 - 117 U/L    Protein, total 6.2 (L) 6.4 - 8.2 g/dL    Albumin 2.4 (L) 3.5 - 5.0 g/dL    Globulin 3.8 2.0 - 4.0 g/dL    A-G Ratio 0.6 (L) 1.1 - 2.2     CBC W/O DIFF   Result Value Ref Range    WBC 5.0 3.6 - 11.0 K/uL    RBC 3.47 (L) 3.80 - 5.20 M/uL    HGB 9.7 (L) 11.5 - 16.0 g/dL    HCT 30.9 (L) 35.0 - 47.0 %    MCV 89.0 80.0 - 99.0 FL    MCH 28.0 26.0 - 34.0 PG    MCHC 31.4 30.0 - 36.5 g/dL    RDW 16.6 (H) 11.5 - 14.5 %    PLATELET 183 150 - 400 K/uL    MPV 9.3 8.9 - 12.9 FL    NRBC 0.0 0 PER 100 WBC    ABSOLUTE NRBC 0.00 0.00 - 0.01 K/uL   POC LACTIC ACID   Result Value Ref Range    Lactic Acid (POC) 1.26 0.40 - 2.00 mmol/L   GLUCOSE, POC   Result Value Ref Range    Glucose (POC) 184 (H) 65 - 117 mg/dL    Performed by OGThomasene Lot  GLUCOSE, POC   Result Value Ref Range    Glucose (POC) 125 (H) 65 - 117 mg/dL    Performed by WhCamie Patience  EKG, 12 LEAD, INITIAL   Result Value Ref Range    Ventricular Rate 137 BPM    Atrial Rate 137 BPM    P-R Interval 126 ms    QRS Duration 74 ms    Q-T Interval 290 ms    QTC Calculation (Bezet) 437 ms    Calculated P Axis 58 degrees    Calculated R Axis 44 degrees    Calculated T Axis 81 degrees     Diagnosis       Sinus tachycardia  Otherwise normal ECG  No previous ECGs available         Physical Exam:   Visit Vitals  BP 105/62 (BP 1 Location: Left arm, BP Patient Position: At rest)   Pulse 87   Temp 98.5 ??F (36.9 ??C)   Resp 18   Ht '5\' 3"'  (1.6 m)   Wt 57.2 kg (126 lb)   SpO2 96%   BMI 22.32 kg/m??     General:  Alert, cooperative, no distress.   Head:  Normocephalic, without obvious abnormality, atraumatic.   Eyes:  Conjunctivae/corneas clear.    Lungs:  Heart:   Non labored breathing  Regular rate and rhythm,  no carotid bruits   Abdomen:   Soft, non-distended   Extremities: Extremities normal, atraumatic, no cyanosis or edema.   Pulses: 2+ and symmetric all extremities.   Skin: Skin color, texture, turgor normal. No rashes or lesions.  Neurologic Exam     Gen: Attention normal  Follows commands, answers questions               Cranial Nerves:  I: smell Not tested   II: visual fields Full to confrontation   II: pupils Equal, round, reactive to light   II: optic disc No papilledema   III,VII: ptosis none   III,IV,VI: extraocular muscles  Full ROM   V: mastication normal   V: facial light touch sensation  normal   VII: facial muscle function   (+) L facial droop   VIII: hearing symmetric   IX: soft palate elevation  normal   XI: trapezius strength  5/5   XI: sternocleidomastoid strength 5/5   XI: neck flexion strength  5/5   XII: tongue  midline     Motor: (+) L UE 3/5  Sensory: Reduced LT on the L UE  Reflexes: 1+ throughout; Down going toes  Coordination: Good FTN and HTS  Gait: deferred           Impression:     Ledonna Dormer is a 75 y.o. female who  has a past medical history of Acquired hypothyroidism, Anxiety and depression, Emphysema lung ([place]), Emphysema lung ([place]), Fibromyalgia, Gout, Graves disease, Graves' disease, Heart failure with preserved ejection fraction ([place]), Hemorrhagic shock ([place]) (02/05/2021), Pulmonary hypertension ([place]), Type 2 diabetes mellitus ([place]), Underweight, and Upper GI bleed  (02/07/2021).  Who has limited history. Patient found on the floor beside her bed unresponsive. In ER, noted to have fever and acute renal faiulre. (+) COVID  Has L sided weakness which she states is new. Patient denies having a stroke before.(+) one time seizure event seen in [place] taking Briviact.     Considerations include AMS due to COVID and stroke (has L sided weakness which patient states is new) .     Also has neck pain. CT cervical spine: C4-C5 moderate central spinal canal stenosis.        RECOMMENDATIONS  1. I had a long discussion with patient. Discussed diagnosis, prognosis, pathophysiology and available treatment. Reviewed test results. All questions were answered.  2. MRI brain to look for stroke  3. EEG  4. Continue Briviact 50 mg BID for now  5. Urine toxic screen, ammonia, vit B12    Please call for questions    Dr Reather Littler is taking over neurology service this afternoon.     Thank you for the consultation      Shelbie Hutching, MD  Diplomate, American Board of Psychiatry and Neurology  Diplomate, Neuromuscular Medicine  Diplomate, American Board of Electrodiagnostic Medicine    Greater than 50% of time spent counseling patient        CC: Buzzy Han, MD  Fax: [phone]

## 2021-07-28 NOTE — Progress Notes (Signed)
 Problem: Dysphagia (Adult)  Goal: *Acute Goals and Plan of Care (Insert Text)  Description: Swallowing goals initiated 07-28-21:  1) tolerate soft and bite sized diet without s/s aspiration by 07-31-21  Outcome: Progressing Towards Goal   SPEECH LANGUAGE PATHOLOGY BEDSIDE SWALLOW EVALUATION  Patient: Savannah Irwin (75 y.o. female)  Date: 07/28/2021  Primary Diagnosis: Fever [R50.9]       Precautions: aspiration       ASSESSMENT :  Based on the objective data described below, the patient presents with extended chew on on challenging solid foods. Eventually had to expectorate.  No overt s/s aspiration. Tolerating pills in liquids WFL.   Admitted 07-27-21 with AMS, fall, nonverbal, L weakness.  CXR: negative, HEad CT: WM dz.  PMH: CVA with L facial droop, sz, Graves dz,  hypothyroid, A=D,  emphysema, pulm HTN, C4-5 moderate spinal stenosis.  DM, fibrormyalgia, gout, CH, underweight, UTI bleed, +h/o tobacco.    Patient will benefit from skilled intervention to address the above impairments.  Patient's rehabilitation potential is considered to be Good     PLAN :  Recommendations and Planned Interventions:  Downgrade diet to soft and bite sized, thin liquids.   Watch for oral pocketing.   Frequency/Duration: Patient will be followed by speech-language pathology 2 times a week to address goals.  Discharge Recommendations: To Be Determined     SUBJECTIVE:   Patient was using both hands. Then she said did you see my L hand fall?. She had let it come down gently.  Then a few minutes later, she moved it volitionally without calling attention to it".    OBJECTIVE:     Past Medical History:   Diagnosis Date    Acquired hypothyroidism     Anxiety and depression     Emphysema lung (HCC)     Emphysema lung (HCC)     Fibromyalgia     Gout     Graves disease     Graves' disease     Heart failure with preserved ejection fraction (HCC)     Hemorrhagic shock (HCC) 02/05/2021    Pulmonary hypertension [facility])     Per [facility] from [facility], patient has a history of severe pulmonary hypertension as admission of February, 2022.  Previously, echocardi[facility] of 11/05/2016 at [facility] showed mild pulmonary hypertension, normal calculated LVEF of 63%, mild tricuspid regurgitation and trace mitral regurgitation.    Type 2 diabetes mellitus (HCC)     Underweight     Upper GI bleed 02/07/2021     Past Surgical History:   Procedure Laterality Date    COLONOSCOPY N/A 02/09/2021    COLONOSCOPY performed by Geary Deitra GRADE, MD at [facility] ENDOSCOPY    HX CHOLECYSTECTOMY  1972    HX HYSTERECTOMY       Prior Level of Function/Home Situation: lives with family  Home Situation  Home Environment: Private residence  One/Two Story Residence: One story  Living Alone: No  Support Systems: Other Family Member(s)  Patient Expects to be Discharged to:: Home  Current DME Used/Available at Home: Cane, straight, Environmental consultant  Diet prior to admission: soft?, thins  Current Diet:  regular, thins   Cognitive and Communication Status:  Neurologic State: Alert  Orientation Level: Oriented to person, Oriented to place, Disoriented to situation, Disoriented to time  Cognition: Impaired decision making, Memory loss  Perception: Appears intact        Oral Assessment:  Oral Assessment  Labial:  (masked facies)  Dentition:  Limited;Natural (reduced care and condition of teeth)  Oral Hygiene: mildly dry and sticky  Lingual: No impairment  Mandible: No impairment  P.O. Trials:  Patient Position: upright in bed  Vocal quality prior to P.O.:  (mildly harsh. low volume voice)  Consistency Presented: Thin liquid;Solid  How Presented: Self-fed/presented;Spoon;Straw;Successive swallows   ORAL PHASE:   Bolus Acceptance: No impairment  Bolus Formation/Control: Impaired (extended chew on bacon and bread.  SLP eventually got her to spit a large bolus of chewed food out)     Propulsion: Delayed (# of seconds)  Oral Residue: Greater than 50% of bolus (extended chewing)      PHARYNGEAL  PHASE:          Coughed x1 when she attempted to swallow bolus of bacon  No coughing with thins.                   NOMS:   The NOMS functional outcome measure was used to quantify this patient's level of swallowing impairment.  Based on the NOMS, the patient was determined to be at level 5 for swallow function       NOMS Swallowing Levels:  Level 1 (CN): NPO  Level 2 (CM): NPO but takes consistency in therapy  Level 3 (CL): Takes less than 50% of nutrition p.o. and continues with nonoral feedings; and/or safe with mod cues; and/or max diet restriction  Level 4 (CK): Safe swallow but needs mod cues; and/or mod diet restriction; and/or still requires some nonoral feeding/supplements  Level 5 (CJ): Safe swallow with min diet restriction; and/or needs min cues  Level 6 (CI): Independent with p.o.; rare cues; usually self cues; may need to avoid some foods or needs extra time  Level 7 (CH): Independent for all p.o.  ASHA. (2003). National Outcomes Measurement System (NOMS): Adult Speech-Language Pathology User's Guide.       Pain:  Pain Scale 1: Numeric (0 - 10)  Pain Intensity 1: 0  Pain Location 1: Head    After treatment:   Patient left in no apparent distress in bed and Nursing notified    COMMUNICATION/EDUCATION:   Patient was educated regarding her deficit(s) of dysphagia as this relates to her diagnosis of h/o CVA.  She demonstrated Fair understanding as evidenced by discussion..    The patient's plan of care including recommendations, planned interventions, and recommended diet changes were discussed with: Registered nurse.     Patient/family have participated as able in goal setting and plan of care.  Patient/family agree to work toward stated goals and plan of care.    Thank you for this referral.  Karna KANDICE Sink, SLP  Time Calculation: 20 mins

## 2021-07-28 NOTE — Progress Notes (Signed)
 Reason for Admission:  found unresponsive at home. Acute encephalopathy, SIRS, sepsis, acute renal failure.  Hx - hypothyroidism, anxiety, depression, COPD, diabetes, pulmonary HTN, CVA with left sided weakness, seizures.    COVID19 Vaccine:  yes,  vaccine x 2, no booster.                   RUR Score:     15%/ moderate risk             PCP: First and Last name:   Diedra Phenes, MD     Name of Practice:    Are you a current patient: Yes/No:   yes   Approximate date of last visit:   July, 2022   Can you participate in a virtual visit if needed:   no    Do you (patient/family) have any concerns for transition/discharge?     Patient lives with her niece Bobbette Sharps.  [name] states patient needs 24/7 supervision and she ([name]) is not able  to provide it.  [name] states patient does not have good awareness of her current care needs. Niece Bobbette states she brought patient to live with her from [place]  but didn't realize how much care she required.              Plan for utilizing home health:   Has had [facility] in the past but patient has declined services.  Rehab at [facility] retreat in the past.    Current Advanced Directive/Advance Care Plan:  Full Code    Healthcare Decision Maker:   Click here to complete HealthCare Decision Makers including selection of the Healthcare Decision Maker Relationship (ie Primary)            Niece Bobbette Sharps  [phone]    Transition of Care Plan:       Chart reviewed, demographics verified.   CM role and follow up discussed.  Met with patient at bedside, face mask on.    Patient lives with her niece.  Patient lives in a one story apartment home with 17 steps to enter home.  Patient has prescription drug coverage, [facility].  Patient requires assistance with all care needs, niece Bobbette is primary caregiver. [name] states patient is resistant to care assistance.  Patient performs ADLs with assist.    Current status:  Patient currently requiring  medical management including ongoing assessment and monitoring. Continues on 2L/NC and nebs.  PT recommending SNF placement. First Assist referral placed to screen patient for Medicaid.     PLAN:  1. Monitor patient response to treatment and recommendations.  2. Medical management continues.  3. CM needs identified:  SNF placement  4. Patient is appropriate for rehab placement.  5. Patient transport home per transport assist vs niece at discharge.  6. CM to monitor clinical progress and disposition recommendations.     Care Management Interventions  PCP Verified by CM: Yes (Dr. Phenes Diedra)  Mode of Transport at Discharge: Other (see comment) (per niece)  Transition of Care Consult (CM Consult): Discharge Planning  Physical Therapy Consult: Yes  Speech Therapy Consult: Yes  Support Systems: Other (Comment) (niece and niece's daughter; son occasionally)  Confirm Follow Up Transport: Family  The Plan for Transition of Care is Related to the Following Treatment Goals : Return home at DC vs placement  Discharge Location  Patient Expects to be Discharged to:: Unable to determine at this time    Glendale Girt, RN, MSN, Care manager

## 2021-07-28 NOTE — Progress Notes (Signed)
0700  Bedside and Verbal shift change report given to Nettie Elm, RN (oncoming nurse) by Lina Sar, RN (offgoing nurse). Report included the following information SBAR, Kardex, Procedure Summary, Intake/Output, MAR, Recent Results, and Med Rec Status.     1900  Bedside and Verbal shift change report given to Rondel Baton, RN (oncoming nurse) by Nettie Elm, RN (offgoing nurse). Report included the following information SBAR, Kardex, Procedure Summary, Intake/Output, MAR, Recent Results, and Med Rec Status.

## 2021-07-29 LAB — RESPIRATORY VIRUS PANEL W/COVID-19, PCR
Adenovirus: NOT DETECTED
Adenovirus: NOT DETECTED
B. parapertussis, PCR: NOT DETECTED
BORDETELLA PARAPERTUSSIS PCR, BORPAR: NOT DETECTED
Bordetella pertussis - PCR: NOT DETECTED
Bordetella pertussis by PCR: NOT DETECTED
CORONAVIRUS 229E: NOT DETECTED
CORONAVIRUS HKU1: NOT DETECTED
CORONAVIRUS NL63: NOT DETECTED
CORONAVIRUS OC43: NOT DETECTED
Chlamydophila pneumoniae DNA, QL, PCR: NOT DETECTED
Chlamydophilia pneumoniae by PCR: NOT DETECTED
Coronavirus 229E: NOT DETECTED
Coronavirus CVNL63: NOT DETECTED
Coronavirus HKU1: NOT DETECTED
Coronavirus OC43: NOT DETECTED
INFLUENZA A: NOT DETECTED
INFLUENZA B: NOT DETECTED
Influenza A: NOT DETECTED
Influenza B: NOT DETECTED
Metapneumovirus: NOT DETECTED
Metapneumovirus: NOT DETECTED
Mycoplasma pneumoniae DNA, QL, PCR: NOT DETECTED
Mycoplasma pneumoniae by PCR: NOT DETECTED
PARAINFLUENZA 4: NOT DETECTED
Parainfluenza 1: NOT DETECTED
Parainfluenza 1: NOT DETECTED
Parainfluenza 2: NOT DETECTED
Parainfluenza 2: NOT DETECTED
Parainfluenza 3: NOT DETECTED
Parainfluenza 3: NOT DETECTED
Parainfluenza virus 4: NOT DETECTED
RSV by PCR: NOT DETECTED
RSV by PCR: NOT DETECTED
Rhinovirus Enterovirus PCR: NOT DETECTED
Rhinovirus and Enterovirus: NOT DETECTED
SARS Coronavirus-2: DETECTED — AB
SARS-CoV-2, PCR: DETECTED — AB

## 2021-07-29 LAB — METABOLIC PANEL, BASIC
Anion gap: 6 mmol/L (ref 5–15)
BUN/Creatinine ratio: 27 — ABNORMAL HIGH (ref 12–20)
BUN: 22 MG/DL — ABNORMAL HIGH (ref 6–20)
CO2: 19 mmol/L — ABNORMAL LOW (ref 21–32)
Calcium: 8 MG/DL — ABNORMAL LOW (ref 8.5–10.1)
Chloride: 115 mmol/L — ABNORMAL HIGH (ref 97–108)
Creatinine: 0.81 MG/DL (ref 0.55–1.02)
GFR est AA: >60 mL/min/{1.73_m2} (ref >60)
GFR est non-AA: >60 mL/min/{1.73_m2} (ref >60)
Glucose: 112 mg/dL — ABNORMAL HIGH (ref 65–100)
Potassium: 3.9 mmol/L (ref 3.5–5.1)
Sodium: 140 mmol/L (ref 136–145)

## 2021-07-29 LAB — GLUCOSE, POC
Glucose (POC): 122 mg/dL — ABNORMAL HIGH (ref 65–117)
Glucose (POC): 96 mg/dL (ref 65–117)
Glucose (POC): 153 mg/dL — ABNORMAL HIGH (ref 65–117)
Glucose (POC): 124 mg/dL — ABNORMAL HIGH (ref 65–117)

## 2021-07-29 LAB — POCT GLUCOSE
POC Glucose: 122 mg/dL — ABNORMAL HIGH (ref 65–117)
POC Glucose: 96 mg/dL (ref 65–117)
POC Glucose: 153 mg/dL — ABNORMAL HIGH (ref 65–117)
POC Glucose: 124 mg/dL — ABNORMAL HIGH (ref 65–117)

## 2021-07-29 LAB — BASIC METABOLIC PANEL
Anion Gap: 6 mmol/L (ref 5–15)
BUN: 22 MG/DL — ABNORMAL HIGH (ref 6–20)
Bun/Cre Ratio: 27 — ABNORMAL HIGH (ref 12–20)
CO2: 19 mmol/L — ABNORMAL LOW (ref 21–32)
Calcium: 8 MG/DL — ABNORMAL LOW (ref 8.5–10.1)
Chloride: 115 mmol/L — ABNORMAL HIGH (ref 97–108)
Creatinine: 0.81 MG/DL (ref 0.55–1.02)
EGFR IF NonAfrican American: >60 mL/min/{1.73_m2} (ref >60)
GFR African American: >60 mL/min/{1.73_m2} (ref >60)
Glucose: 112 mg/dL — ABNORMAL HIGH (ref 65–100)
Potassium: 3.9 mmol/L (ref 3.5–5.1)
Sodium: 140 mmol/L (ref 136–145)

## 2021-07-29 MED ORDER — INSULIN LISPRO 100 UNIT/ML INJECTION
100 unit/mL | Freq: Four times a day (QID) | SUBCUTANEOUS | Status: DC
Start: 2021-07-29 — End: 2021-07-31
  Administered 2021-07-29 – 2021-07-31 (×4): via SUBCUTANEOUS

## 2021-07-29 MED FILL — SERTRALINE 50 MG TAB: 50 mg | ORAL | Qty: 1

## 2021-07-29 MED FILL — METOPROLOL TARTRATE 25 MG TAB: 25 mg | ORAL | Qty: 1

## 2021-07-29 MED FILL — BRIVIACT 25 MG TABLET: 25 mg | ORAL | Qty: 2

## 2021-07-29 MED FILL — TOPIRAMATE 25 MG TAB: 25 mg | ORAL | Qty: 1

## 2021-07-29 MED FILL — LEVOTHYROXINE 50 MCG TAB: 50 mcg | ORAL | Qty: 1

## 2021-07-29 MED FILL — ACETAMINOPHEN 325 MG TABLET: 325 mg | ORAL | Qty: 2

## 2021-07-29 MED FILL — INSULIN LISPRO 100 UNIT/ML INJECTION: 100 unit/mL | SUBCUTANEOUS | Qty: 2

## 2021-07-29 MED FILL — PANTOPRAZOLE 40 MG TAB, DELAYED RELEASE: 40 mg | ORAL | Qty: 1

## 2021-07-29 MED FILL — ATORVASTATIN 10 MG TAB: 10 mg | ORAL | Qty: 1

## 2021-07-29 MED FILL — ENOXAPARIN 40 MG/0.4 ML SUB-Q SYRINGE: 40 mg/0.4 mL | SUBCUTANEOUS | Qty: 0.4

## 2021-07-29 NOTE — Progress Notes (Signed)
Bedside shift change report given to [place] Control and instrumentation engineer) by Alinda Money (offgoing nurse). Report included the following information SBAR, Kardex, Recent Results, Med Rec Status, and Cardiac Rhythm NSR .     Bedside shift change report given to Alinda Money (Cabin crew) by Diannia Ruder (offgoing nurse). Report included the following information SBAR, Kardex, Med Rec Status, and Cardiac Rhythm NSR .

## 2021-07-29 NOTE — Progress Notes (Signed)
Neurology - Inpatient Progress Note     Name:   Savannah Irwin  MRN:    [id number]  Admit Date:   07/27/2021    Follow up:   Left side weakness, altered mentation      Interval History     Reviewed Dr Jennings Books consult note from yesterday: Limited history.  Patient found on the ground unresponsive. In ER, noted to have fever and acute renal faiulre. (+) COVID.  Has L sided weakness which she states is new. Patient denies having a stroke before.  (+) neck pain.  (+) one time seizure event seen in [place] taking Briviact.  Considerations include AMS due to COVID and stroke (has L sided weakness which patient states is new).  Also has neck pain. CT cervical spine: C4-C5 moderate central spinal canal stenosis.    Brain MRI: 1. No acute infarction. Generalized parenchymal volume loss and chronic white matter disease.  2. Prominence left MCA bifurcation could be due to tortuosity/volume averaging.  Consider MRA to exclude aneurysm.     EEG: normal drowsy EEG  B12 is elevated at 1309  Ammonia level is normal       Pt awake, resting in bed, attempting to eat lunch.  Alert.  Can tell me she's at [facility], calls it [place], August, 2022, summer.  D/w her that the Brain MRI didn't show any evidence of stroke and the EEG was normal (no seizure discharges).  She has trouble lifting either hand in air, seems in pain, tells me she has arthritis everywhere.        Brief ROS: As noted above         Allergies   Allergen Reactions    Aluminum Rash     Discoloration to skin    Penicillins Itching       Current Facility-Administered Medications:     insulin lispro (HUMALOG) injection, , SubCUTAneous, AC&HS, Airewele, Angela Burke, MD, 2 Units at 07/29/21 1244    enoxaparin (LOVENOX) injection 40 mg, 40 mg, SubCUTAneous, DAILY, Tefera, Mesfin, MD, 40 mg at 07/29/21 1109    sodium chloride (NS) flush 5-10 mL, 5-10 mL, IntraVENous, PRN, Orgill, Imani A, MD    levothyroxine (SYNTHROID) tablet 50 mcg, 50 mcg, Oral, ACB, Tefera, Mesfin,  MD, 50 mcg at 07/29/21 0622    metoprolol tartrate (LOPRESSOR) tablet 25 mg, 25 mg, Oral, BID, Tefera, Mesfin, MD, 25 mg at 07/29/21 1108    pantoprazole (PROTONIX) tablet 40 mg, 40 mg, Oral, DAILY, Tefera, Mesfin, MD, 40 mg at 07/29/21 0622    sertraline (ZOLOFT) tablet 50 mg, 50 mg, Oral, DAILY, Tefera, Mesfin, MD, 50 mg at 07/29/21 1108    topiramate (TOPAMAX) tablet 25 mg, 25 mg, Oral, BID WITH MEALS, Tefera, Mesfin, MD, 25 mg at 07/29/21 1109    brivaracetam (BRIVIACT) tablet 50 mg, 50 mg, Oral, BID, Tefera, Mesfin, MD, 50 mg at 07/29/21 1108    atorvastatin (LIPITOR) tablet 10 mg, 10 mg, Oral, DAILY, Tefera, Mesfin, MD, 10 mg at 07/29/21 1108    sodium chloride (NS) flush 5-40 mL, 5-40 mL, IntraVENous, Q8H, Tefera, Mesfin, MD, 10 mL at 07/29/21 0622    sodium chloride (NS) flush 5-40 mL, 5-40 mL, IntraVENous, PRN, Tefera, Mesfin, MD    acetaminophen (TYLENOL) tablet 650 mg, 650 mg, Oral, Q6H PRN, 650 mg at 07/28/21 2043 **OR** acetaminophen (TYLENOL) suppository 650 mg, 650 mg, Rectal, Q6H PRN, Tefera, Mesfin, MD    polyethylene glycol (MIRALAX) packet 17 g, 17 g, Oral, DAILY  PRN, Mylo Red, Mesfin, MD    ondansetron (ZOFRAN ODT) tablet 4 mg, 4 mg, Oral, Q8H PRN **OR** ondansetron (ZOFRAN) injection 4 mg, 4 mg, IntraVENous, Q6H PRN, Tefera, Mesfin, MD    albuterol-ipratropium (DUO-NEB) 2.5 MG-0.5 MG/3 ML, 3 mL, Nebulization, Q4H PRN, Tefera, Mesfin, MD, 3 mL at 07/27/21 2040    glucose chewable tablet 16 g, 4 Tablet, Oral, PRN, Tefera, Mesfin, MD    glucagon (GLUCAGEN) injection 1 mg, 1 mg, IntraMUSCular, PRN, Tefera, Mesfin, MD    dextrose 10% infusion 0-250 mL, 0-250 mL, IntraVENous, PRN, Mylo Red, Mesfin, MD      PMHx: has a past medical history of Acquired hypothyroidism, Anxiety and depression, Emphysema lung (HCC), Emphysema lung (HCC), Fibromyalgia, Gout, Graves disease, Graves' disease, Heart failure with preserved ejection fraction (HCC), Hemorrhagic shock (HCC) (02/05/2021), Pulmonary hypertension (HCC),  Type 2 diabetes mellitus (HCC), Underweight, and Upper GI bleed (02/07/2021).    PSHx: has a past surgical history that includes hx hysterectomy; hx cholecystectomy (1972); and colonoscopy (N/A, 02/09/2021).      Impression / Plan       ICD-10-CM ICD-9-CM   1. Dehydration  E86.0 276.51   2. Fever, unspecified fever cause  R50.9 780.60   3. Delirium  R41.0 780.09   4. Encephalopathy acute [G93.40 (ICD-10-CM)]  G93.40 348.30       Seems relatively alert at present  MRI and EEG are unrevealing  Continue home dose of Briviact due to prior hx of single seizure  No additional neurology workup is planned      Signed By: Oris Drone, MD     July 29, 2021

## 2021-07-29 NOTE — Procedures (Signed)
Name:   Savannah Irwin  Age/ Sex:   75 y.o. female     Procedure:   EEG     Date of Recording: 07-28-21    Date of Interpretation:    07/29/21    Interpreting Physician: Oris Drone, MD     Indication:      ICD-10-CM ICD-9-CM    1. Dehydration  E86.0 276.51       2. Fever, unspecified fever cause  R50.9 780.60       3. Delirium  R41.0 780.09       4. Encephalopathy acute [G93.40 (ICD-10-CM)]  G93.40 348.30           Current medications: has a current medication list which includes the following prescription(s): insulin glargine, allopurinol, metoprolol tartrate, levothyroxine, insulin lispro, pantoprazole, sertraline, amitriptyline, atorvastatin, topiramate, briviact, ergocalciferol, clotrimazole, and ipratropium, and the following Facility-Administered Medications: insulin lispro, enoxaparin, sodium chloride, levothyroxine, metoprolol tartrate, pantoprazole, sertraline, topiramate, brivaracetam, atorvastatin, sodium chloride, sodium chloride, acetaminophen **OR** acetaminophen, polyethylene glycol, ondansetron **OR** ondansetron, 0.9% sodium chloride, albuterol-ipratropium, glucose, glucagon, dextrose 10%.    Technical: Digital EEG recorded in 10-20 international placement system, multiple montages    Interpretation:       Pt is described as Asleep at beginning of recording  Background is symmetric  PDR ranges from 2-4 Hz bilateral  Photic stimulation no clear driving  response  Hyperventilation not performed  Drowsiness is recorded  Sleep is not recorded (no sleep spindles, vertex waves, or K-complexes)    Epileptiform discharges none  Focal areas of slowing none  Single lead EKG poorly recorded    Impression:  Normal drowsy EEG recording.      Clinical correlation is necessary.

## 2021-07-29 NOTE — Progress Notes (Signed)
Hospitalist Progress Note              Daisy Floro, MD.                                                             Cell: [phone]                               NAME:  Savannah Irwin  DOB:  [birth year]  MRN:  [id number]  Date of Service:  07/29/2021    Summary: 75 y.o. female with past medical history of hypothyroidism, anxiety, depression, emphesema, who presented on 07/27/2021 with unresponsiveness.       Assessment/Plan:  Unresponsiveness: Unclear etiology but probably due vasovagal syncope  CT scan head atrophy  and chronic small vessel disease. No intracranial findings  Brain MRI negative for acute   EEG negative for seizures  Continue briviact, topamax  Patient is currently awake and verbally responsive  Resolved  Appreciate neurology input  D/c IVF    SIRS/Sepsis  Meets criteria- Fever, tachycardia  U/A and CXR negative  Likely due to COVID  Hold off antibiotics    ARF (acute renal failure): Scr on admission 1.58  Likely due to IVVD  Continue IVF  Now resolved  CK 40     Type 2 diabetes mellitus (HCC).  Diet controlled.  Continue sliding scale       Anxiety and depression.  Continue Topamax     Essential hypertension: On metoprolol. BP stable      Emphysema lung (HCC).  Not bronchospastic.  DuoNeb as needed     Mixed hyperlipidemia (03/22/2021).  Resume statin     Hypothyroidism (06/21/2021).  Continue levothyroxine     Tachycardia (07/27/2021).  Likely due to dehydration/fever.  Continue IV fluid  Resolved    COVID 19 infection    Anemia: H/H stable    Arthritis: Tylenol as needed   PT recommending SNF      Code status: full  DVT prophylaxsis: lovenox  Dispo: Pending d/c SNF         Interval History/Subjective:  F/up for unresponsiveness  Awake and verbally responsive  C/o shoulder and bilateral knee pain     Review of Systems:  A comprehensive review of systems was negative except for that written in the HPI.      Objective:     VITALS:   Last 24hrs VS reviewed  since prior progress note. Most recent are:  Visit Vitals  BP 132/82 (BP 1 Location: Right lower arm, BP Patient Position: At rest;Supine)   Pulse 96   Temp 98.9 ??F (37.2 ??C)   Resp 17   Ht 5\' 3"  (1.6 m)   Wt 51.2 kg (112 lb 14.4 oz)   SpO2 97%   BMI 20.00 kg/m??       Intake/Output Summary (Last 24 hours) at 07/29/2021 1316  Last data filed at 07/29/2021 0616  Gross per 24 hour   Intake --   Output 500 ml   Net -500 ml          PHYSICAL EXAM:  General: No acute distress, cooperative, pleasant   EENT: EOMI. Anicteric sclerae. Oral mucous moist, oropharynx benign  Resp: CTA bilaterally. No wheezing/rhonchi/rales. No accessory muscle  use  CV: Regular rhythm, normal rate, no murmurs, gallops, rubs  GI: Soft, non distended, non tender. normoactive bowel sounds, no hepatosplenomegaly Extremities: No edema, warm, 2+ pulses throughout  Neurologic: Moves all extremities.  AAOx3, CN II-XII grossly intact  Psych: Good insight. Not anxious nor agitated.  Skin: Good Turgor, no rashes or ulcers    Lab Data Personally Reviewed: (see below)     Medications list Personally Reviewed:  x YES  NO     _______________________________________________________________________  Care Plan discussed with:  Patient/Family, Nurse, and Case Manager    Total NON critical care TIME:  30 minutes    Roderic Scarce, MD     Procedures: see electronic medical records for all procedures/Xrays and details which were not copied into this note but were reviewed prior to creation of Plan.      LABS:  Recent Labs     07/28/21  0244 07/27/21  1619   WBC 5.0 8.0   HGB 9.7* 12.7   HCT 30.9* 39.8   PLT 183 228       Recent Labs     07/29/21  0403 07/28/21  0244 07/27/21  1619   NA 140 145 137   K 3.9 4.0 4.2   CL 115* 117* 107   CO2 19* 22 22   BUN 22* 35* 49*   CREA 0.81 1.00 1.58*   GLU 112* 138* 238*   CA 8.0* 7.7* 9.6       Recent Labs     07/28/21  0244 07/27/21  1619   ALT 8* 10*   AP 64 92   TBILI 0.7 1.1*   TP 6.2* 9.4*   ALB 2.4* 3.3*   GLOB 3.8 6.1*        No results for input(s): INR, PTP, APTT, INREXT, INREXT in the last 72 hours.   No results for input(s): FE, TIBC, PSAT, FERR in the last 72 hours.   Lab Results   Component Value Date/Time    Folate 5.5 02/07/2021 02:26 PM        No results for input(s): PH, PCO2, PO2 in the last 72 hours.  Recent Labs     07/27/21  1619   CPK 40       Lab Results   Component Value Date/Time    Cholesterol, total 126 06/21/2021 11:18 AM    HDL Cholesterol 43 06/21/2021 11:18 AM    LDL, calculated 66 06/21/2021 11:18 AM    Triglyceride 85 06/21/2021 11:18 AM     Lab Results   Component Value Date/Time    Glucose (POC) 153 (H) 07/29/2021 12:32 PM    Glucose (POC) 96 07/29/2021 06:14 AM    Glucose (POC) 122 (H) 07/29/2021 12:18 AM    Glucose (POC) 110 07/28/2021 05:44 PM    Glucose (POC) 142 (H) 07/28/2021 11:18 AM     Lab Results   Component Value Date/Time    Color YELLOW/STRAW 07/27/2021 05:46 PM    Appearance CLEAR 07/27/2021 05:46 PM    Specific gravity 1.018 07/27/2021 05:46 PM    pH (UA) 5.0 07/27/2021 05:46 PM    Protein 300 (A) 07/27/2021 05:46 PM    Glucose Negative 07/27/2021 05:46 PM    Ketone 15 (A) 07/27/2021 05:46 PM    Bilirubin Negative 07/27/2021 05:46 PM    Urobilinogen 1.0 07/27/2021 05:46 PM    Nitrites Negative 07/27/2021 05:46 PM    Leukocyte Esterase Negative 07/27/2021 05:46 PM    Epithelial cells  FEW 07/27/2021 05:46 PM    Bacteria Negative 07/27/2021 05:46 PM    WBC 0-4 07/27/2021 05:46 PM    RBC 0-5 07/27/2021 05:46 PM

## 2021-07-29 NOTE — Progress Notes (Signed)
This patient was assisted with Intentional Toileting every 2 hours during this shift as appropriate.  Documentation of ambulation and output reflected on Flowsheet as appropriate.  Purposeful hourly rounding was completed using AIDET and 5Ps.  Outcomes of PHR documented as they occurred. Bed alarm in use as appropriate.  Dual Suction and ambubag in place.

## 2021-07-29 NOTE — Progress Notes (Signed)
0700  Bedside and Verbal shift change report given to Tony Leon, RN (oncoming nurse) by Kara Linares, RN (offgoing nurse). Report included the following information SBAR, Kardex, Procedure Summary, Intake/Output, MAR, Recent Results, and Med Rec Status.     1900  Bedside and Verbal shift change report given to Kara Linares, RN (oncoming nurse) by Tony Leon, RN (offgoing nurse). Report included the following information SBAR, Kardex, Procedure Summary, Intake/Output, MAR, Recent Results, and Med Rec Status.

## 2021-07-29 NOTE — Progress Notes (Signed)
Physician Progress Note      PATIENTHYE, Savannah Irwin  CSN #:                  [id number]  DOB:                       [birth year]  ADMIT DATE:       07/27/2021 3:22 PM  DISCH DATE:  RESPONDING  PROVIDER #:        Roderic Scarce MD          QUERY TEXT:    Good afternoon  Patient admitted with unresponsiveness.  Noted documentation of sepsis in progress note on 08/19.    In order to support the diagnosis of sepsis, please include additional clinical indicators in your documentation.  Or please document if the diagnosis of sepsis has been ruled out after further study.    The medical record reflects the following:  Risk Factors: Covid-19, acute renal failure, DM2, emphysema, unresponsiveness  Clinical Indicators: progress note "SIRS/Sepsis Meets criteria- Fever, tachycardia", U/A and CXR negative, "Hold off antibiotics", VS: 102.5,140, 20, 108/82, 97% on RAS on presentation-VS: 102.5,140, 20, 108/82, 97% on RA  Treatment: IVF bolus, daily lab monitoring, frequent VS monitoring    Thank you  Katina Degree RN CDI  [phone]  Options provided:  -- Sepsis present as evidenced by, Please document evidence.  -- Sepsis was ruled out after study  -- SIRS present as evidenced by, Please document evidence.  -- Sepsis and SIRS was ruled out after study  -- Other - I will add my own diagnosis  -- Disagree - Not applicable / Not valid  -- Disagree - Clinically unable to determine / Unknown  -- Refer to Clinical Documentation Reviewer    PROVIDER RESPONSE TEXT:    SIRS is present as evidenced by Tachycardia, fever    Query created by: Katina Degree on 07/28/2021 12:46 PM      Electronically signed by:  Roderic Scarce MD 07/29/2021 1:18 PM

## 2021-07-30 LAB — GLUCOSE, POC
Glucose (POC): 124 mg/dL — ABNORMAL HIGH (ref 65–117)
Glucose (POC): 130 mg/dL — ABNORMAL HIGH (ref 65–117)
Glucose (POC): 168 mg/dL — ABNORMAL HIGH (ref 65–117)
Glucose (POC): 127 mg/dL — ABNORMAL HIGH (ref 65–117)

## 2021-07-30 LAB — POCT GLUCOSE
POC Glucose: 124 mg/dL — ABNORMAL HIGH (ref 65–117)
POC Glucose: 130 mg/dL — ABNORMAL HIGH (ref 65–117)
POC Glucose: 168 mg/dL — ABNORMAL HIGH (ref 65–117)
POC Glucose: 127 mg/dL — ABNORMAL HIGH (ref 65–117)

## 2021-07-30 MED FILL — BRIVIACT 25 MG TABLET: 25 mg | ORAL | Qty: 2

## 2021-07-30 MED FILL — LEVOTHYROXINE 50 MCG TAB: 50 mcg | ORAL | Qty: 1

## 2021-07-30 MED FILL — SERTRALINE 50 MG TAB: 50 mg | ORAL | Qty: 1

## 2021-07-30 MED FILL — PANTOPRAZOLE 40 MG TAB, DELAYED RELEASE: 40 mg | ORAL | Qty: 1

## 2021-07-30 MED FILL — ACETAMINOPHEN 325 MG TABLET: 325 mg | ORAL | Qty: 2

## 2021-07-30 MED FILL — ENOXAPARIN 40 MG/0.4 ML SUB-Q SYRINGE: 40 mg/0.4 mL | SUBCUTANEOUS | Qty: 0.4

## 2021-07-30 MED FILL — TOPIRAMATE 25 MG TAB: 25 mg | ORAL | Qty: 1

## 2021-07-30 MED FILL — ATORVASTATIN 10 MG TAB: 10 mg | ORAL | Qty: 1

## 2021-07-30 MED FILL — INSULIN LISPRO 100 UNIT/ML INJECTION: 100 unit/mL | SUBCUTANEOUS | Qty: 2

## 2021-07-30 MED FILL — METOPROLOL TARTRATE 25 MG TAB: 25 mg | ORAL | Qty: 1

## 2021-07-30 NOTE — Progress Notes (Signed)
Hospitalist Progress Note              Daisy Floro, MD.                                                             Cell: [phone]                               NAME:  Savannah Irwin  DOB:  [birth year]  MRN:  [id number]  Date of Service:  07/30/2021    Summary: 75 y.o. female with past medical history of hypothyroidism, anxiety, depression, emphesema, who presented on 07/27/2021 with unresponsiveness.       Assessment/Plan:  Unresponsiveness: Unclear etiology but probably due vasovagal syncope  CT scan head atrophy  and chronic small vessel disease. No intracranial findings  Brain MRI negative for acute   EEG negative for seizures  Continue briviact, topamax  Patient is currently awake and verbally responsive  Resolved  Appreciate neurology input  D/c IVF    SIRS/Sepsis  Meets criteria- Fever, tachycardia  U/A and CXR negative  Likely due to COVID  Hold off antibiotics  SIRS criteria resolved    ARF (acute renal failure): Scr on admission 1.58  Likely due to IVVD  Continue IVF  Now resolved  CK 40     Type 2 diabetes mellitus (HCC).  Diet controlled.  Continue sliding scale       Anxiety and depression.  Continue Topamax     Essential hypertension: On metoprolol. BP stable      Emphysema lung (HCC).  Not bronchospastic.  DuoNeb as needed     Mixed hyperlipidemia (03/22/2021).  Resume statin     Hypothyroidism (06/21/2021).  Continue levothyroxine     Tachycardia (07/27/2021).  Likely due to dehydration/fever.  Continue IV fluid  Resolved    COVID 19 infection    Anemia: H/H stable    Arthritis: Tylenol as needed   PT recommending SNF    Out of bed to chair      Code status: full  DVT prophylaxsis: lovenox  Dispo: Pending d/c to SNF         Interval History/Subjective:  F/up for unresponsiveness  Awake and verbally responsive  No new complaints    Review of Systems:  A comprehensive review of systems was negative except for that written in the HPI.      Objective:      VITALS:   Last 24hrs VS reviewed since prior progress note. Most recent are:  Visit Vitals  BP 130/76 (BP 1 Location: Right lower arm, BP Patient Position: At rest;Supine)   Pulse (!) 104   Temp 98.6 ??F (37 ??C)   Resp 17   Ht 5\' 3"  (1.6 m)   Wt 51.2 kg (112 lb 14.4 oz)   SpO2 98%   BMI 20.00 kg/m??       Intake/Output Summary (Last 24 hours) at 07/30/2021 1011  Last data filed at 07/30/2021 0351  Gross per 24 hour   Intake --   Output 1300 ml   Net -1300 ml          PHYSICAL EXAM:  General: No acute distress, cooperative, pleasant   EENT: EOMI. Anicteric sclerae. Oral mucous moist, oropharynx  benign  Resp: CTA bilaterally. No wheezing/rhonchi/rales. No accessory muscle use  CV: Regular rhythm, normal rate, no murmurs, gallops, rubs  GI: Soft, non distended, non tender. normoactive bowel sounds, no hepatosplenomegaly Extremities: No edema, warm, 2+ pulses throughout  Neurologic: Moves all extremities.  AAOx3, CN II-XII grossly intact  Psych: Good insight. Not anxious nor agitated.  Skin: Good Turgor, no rashes or ulcers    Lab Data Personally Reviewed: (see below)     Medications list Personally Reviewed:  x YES  NO     _______________________________________________________________________  Care Plan discussed with:  Patient/Family, Nurse, and Case Manager    Total NON critical care TIME:  30 minutes    Roderic Scarce, MD     Procedures: see electronic medical records for all procedures/Xrays and details which were not copied into this note but were reviewed prior to creation of Plan.      LABS:  Recent Labs     07/28/21  0244 07/27/21  1619   WBC 5.0 8.0   HGB 9.7* 12.7   HCT 30.9* 39.8   PLT 183 228       Recent Labs     07/29/21  0403 07/28/21  0244 07/27/21  1619   NA 140 145 137   K 3.9 4.0 4.2   CL 115* 117* 107   CO2 19* 22 22   BUN 22* 35* 49*   CREA 0.81 1.00 1.58*   GLU 112* 138* 238*   CA 8.0* 7.7* 9.6       Recent Labs     07/28/21  0244 07/27/21  1619   ALT 8* 10*   AP 64 92   TBILI 0.7 1.1*   TP  6.2* 9.4*   ALB 2.4* 3.3*   GLOB 3.8 6.1*       No results for input(s): INR, PTP, APTT, INREXT, INREXT in the last 72 hours.   No results for input(s): FE, TIBC, PSAT, FERR in the last 72 hours.   Lab Results   Component Value Date/Time    Folate 5.5 02/07/2021 02:26 PM        No results for input(s): PH, PCO2, PO2 in the last 72 hours.  Recent Labs     07/27/21  1619   CPK 40       Lab Results   Component Value Date/Time    Cholesterol, total 126 06/21/2021 11:18 AM    HDL Cholesterol 43 06/21/2021 11:18 AM    LDL, calculated 66 06/21/2021 11:18 AM    Triglyceride 85 06/21/2021 11:18 AM     Lab Results   Component Value Date/Time    Glucose (POC) 130 (H) 07/30/2021 09:22 AM    Glucose (POC) 124 (H) 07/29/2021 08:51 PM    Glucose (POC) 124 (H) 07/29/2021 04:09 PM    Glucose (POC) 153 (H) 07/29/2021 12:32 PM    Glucose (POC) 96 07/29/2021 06:14 AM     Lab Results   Component Value Date/Time    Color YELLOW/STRAW 07/27/2021 05:46 PM    Appearance CLEAR 07/27/2021 05:46 PM    Specific gravity 1.018 07/27/2021 05:46 PM    pH (UA) 5.0 07/27/2021 05:46 PM    Protein 300 (A) 07/27/2021 05:46 PM    Glucose Negative 07/27/2021 05:46 PM    Ketone 15 (A) 07/27/2021 05:46 PM    Bilirubin Negative 07/27/2021 05:46 PM    Urobilinogen 1.0 07/27/2021 05:46 PM    Nitrites Negative 07/27/2021 05:46 PM  Leukocyte Esterase Negative 07/27/2021 05:46 PM    Epithelial cells FEW 07/27/2021 05:46 PM    Bacteria Negative 07/27/2021 05:46 PM    WBC 0-4 07/27/2021 05:46 PM    RBC 0-5 07/27/2021 05:46 PM

## 2021-07-30 NOTE — Progress Notes (Signed)
0700  Bedside and Verbal shift change report given to Tony Leon, RN (oncoming nurse) by Kara Linares, RN (offgoing nurse). Report included the following information SBAR, Kardex, Procedure Summary, Intake/Output, MAR, Recent Results, and Med Rec Status.     1900  Bedside and Verbal shift change report given to Kara Linares, RN (oncoming nurse) by Tony Leon, RN (offgoing nurse). Report included the following information SBAR, Kardex, Procedure Summary, Intake/Output, MAR, Recent Results, and Med Rec Status.

## 2021-07-31 ENCOUNTER — Inpatient Hospital Stay: Admit: 2021-07-31 | Payer: MEDICARE | Primary: Family Medicine

## 2021-07-31 LAB — GLUCOSE, POC
Glucose (POC): 125 mg/dL — ABNORMAL HIGH (ref 65–117)
Glucose (POC): 142 mg/dL — ABNORMAL HIGH (ref 65–117)
Glucose (POC): 177 mg/dL — ABNORMAL HIGH (ref 65–117)
Glucose (POC): 195 mg/dL — ABNORMAL HIGH (ref 65–117)

## 2021-07-31 LAB — URINE CULTURE HOLD SAMPLE

## 2021-07-31 LAB — POCT GLUCOSE
POC Glucose: 125 mg/dL — ABNORMAL HIGH (ref 65–117)
POC Glucose: 142 mg/dL — ABNORMAL HIGH (ref 65–117)
POC Glucose: 177 mg/dL — ABNORMAL HIGH (ref 65–117)
POC Glucose: 195 mg/dL — ABNORMAL HIGH (ref 65–117)

## 2021-07-31 MED ORDER — DEXTROSE 10% IN WATER (D10W) IV
10 % | INTRAVENOUS | Status: DC | PRN
Start: 2021-07-31 — End: 2021-08-04

## 2021-07-31 MED ORDER — METFORMIN 500 MG TAB
500 mg | Freq: Two times a day (BID) | ORAL | Status: DC
Start: 2021-07-31 — End: 2021-08-03
  Administered 2021-07-31 – 2021-08-03 (×6): via ORAL

## 2021-07-31 MED ORDER — INSULIN LISPRO 100 UNIT/ML INJECTION
100 unit/mL | Freq: Four times a day (QID) | SUBCUTANEOUS | Status: DC
Start: 2021-07-31 — End: 2021-07-31

## 2021-07-31 MED ORDER — INSULIN LISPRO 100 UNIT/ML INJECTION
100 unit/mL | Freq: Four times a day (QID) | SUBCUTANEOUS | Status: DC
Start: 2021-07-31 — End: 2021-08-04
  Administered 2021-07-31 – 2021-08-04 (×14): via SUBCUTANEOUS

## 2021-07-31 MED FILL — BRIVIACT 25 MG TABLET: 25 mg | ORAL | Qty: 2

## 2021-07-31 MED FILL — TOPIRAMATE 25 MG TAB: 25 mg | ORAL | Qty: 1

## 2021-07-31 MED FILL — LEVOTHYROXINE 50 MCG TAB: 50 mcg | ORAL | Qty: 1

## 2021-07-31 MED FILL — METFORMIN 500 MG TAB: 500 mg | ORAL | Qty: 1

## 2021-07-31 MED FILL — METOPROLOL TARTRATE 25 MG TAB: 25 mg | ORAL | Qty: 1

## 2021-07-31 MED FILL — ACETAMINOPHEN 325 MG TABLET: 325 mg | ORAL | Qty: 2

## 2021-07-31 MED FILL — SERTRALINE 50 MG TAB: 50 mg | ORAL | Qty: 1

## 2021-07-31 MED FILL — PANTOPRAZOLE 40 MG TAB, DELAYED RELEASE: 40 mg | ORAL | Qty: 1

## 2021-07-31 MED FILL — ENOXAPARIN 40 MG/0.4 ML SUB-Q SYRINGE: 40 mg/0.4 mL | SUBCUTANEOUS | Qty: 0.4

## 2021-07-31 MED FILL — INSULIN LISPRO 100 UNIT/ML INJECTION: 100 unit/mL | SUBCUTANEOUS | Qty: 2

## 2021-07-31 MED FILL — ATORVASTATIN 10 MG TAB: 10 mg | ORAL | Qty: 1

## 2021-07-31 NOTE — Progress Notes (Signed)
 Admission Medication Reconciliation:     Information obtained from:    Pill bottles.  RxQuery data available:  YES    Comments/Recommendations:   The  patient has AMS and is unable to complete the interview. Pharmacist called the patient's niece who declines interview and states she sent the pill bottles in with the patient. Pharmacist reviewed the pill bottles and updated the list below. The bag of pills did not contain the two insulins listed below. Pharmacist can confirm both insulins listed below are recent prescriptions which were filled per Rx Query. The insulins have been left on the PTA list based on recent proscriptions which has been recently filled. Last doses and compliance are unknown.       RxQuery pharmacy benefit data reflects medications filled and processed through the patient's insurance, however   this data does NOT capture whether the medication was picked up or is currently being taken by the patient.    Prior to Admission Medications   Prescriptions Last Dose Informant Taking?   Briviact 50 mg tablet  Other Yes   Sig: Take 1 Tablet by mouth two (2) times a day. Max Daily Amount: 100 mg.   allopurinoL (ZYLOPRIM) 100 mg tablet  Other Yes   Sig: Take 1 Tablet by mouth daily.   amitriptyline (ELAVIL) 50 mg tablet  Other Yes   Sig: Take 1 Tablet by mouth nightly. For pain   atorvastatin (LIPITOR) 10 mg tablet  Other Yes   Sig: Take 1 Tablet by mouth daily. For cholesterol   ergocalciferol (ERGOCALCIFEROL) 1,250 mcg (50,000 unit) capsule  Other Yes   Sig: Take 50,000 Units by mouth every Wednesday.   insulin glargine (LANTUS,BASAGLAR) 100 unit/mL (3 mL) inpn  Other Yes   Sig: 15 Units by SubCUTAneous route nightly. For diabetes   insulin lispro (HUMALOG) 100 unit/mL kwikpen  Other Yes   Sig: USE AS DIRECTED BEFORE MEAL(S) AND AT BEDTIME PER SLIDING SCALE.IF 150-200 INJECT 2 UNITS,201-250 INJECT 4 U,251-300 INJECT 6 U,301-350 INJECT 8 U,351-400 INJECT 10 U. GIVE 12U IF BS GREATER THAN 401 CALL MD.    levothyroxine (Euthyrox) 50 mcg tablet  Other Yes   Sig: Take 1 Tablet by mouth Daily (before breakfast). For hypothyroid   metoprolol tartrate (LOPRESSOR) 25 mg tablet  Other Yes   Sig: Take 1 Tablet by mouth two (2) times a day. For blood pressure   pantoprazole (PROTONIX) 40 mg tablet  Other Yes   Sig: Take 1 Tablet by mouth daily. For heartburn   sertraline (ZOLOFT) 50 mg tablet  Other Yes   Sig: Take 1 Tablet by mouth daily. For depression   tiotropium-olodateroL (Stiolto Respimat) 2.5-2.5 mcg/actuation inhaler  Other Yes   Sig: Take 2 Puffs by inhalation daily.   topiramate (TOPAMAX) 25 mg tablet  Other Yes   Sig: TAKE 1 TABLET BY MOUTH ONCE DAILY FOR MIGRAINE/SEIZURE.      Facility-Administered Medications: None         Please contact the main inpatient pharmacy with any questions or concerns at [phone] and we will direct you to the clinical pharmacist covering this patient's care while in-house.   Olam Fireman, PharmD, BCPS

## 2021-07-31 NOTE — Progress Notes (Signed)
0700 Bedside and Verbal shift change report given to [facility] RN (oncoming nurse) by Diannia Ruder RN (offgoing nurse). Report included the following information SBAR, Kardex, ED Summary, Procedure Summary, Intake/Output, MAR, Recent Results, and Cardiac Rhythm NSR .     This patient was assisted with Intentional Toileting every 2 hours during this shift as appropriate.  Documentation of ambulation and output reflected on Flowsheet as appropriate.  Purposeful hourly rounding was completed using AIDET and 5Ps.  Outcomes of PHR documented as they occurred. Bed alarm in use as appropriate.  Dual Suction and ambubag in place.    1300 Pt removed from Telemetry per MD orders.    1545 Pt medications given to Security to lock up per [employer]. Pt home Briviact counted with Edwina Barth. Retrieval strip placed in Pt chart.     1729 Notified Dr. Kaleen Odea about Pt being increasingly drowsy and unwilling to participate in bed mobility. Pt still able to answer questions when asked. Notified MD that Pt urine appears to be cloudy and amber in color with recent check. Ok to obtain UA via straight cath per MD. MD to place orders for CXR and RN to reevaluate Oxygen needs. Pt in no acute respiratory distress at this time, placed Pt on Room Air.    1930 Bedside and Verbal shift change report given to [place] RN (oncoming nurse) by Serita Kyle RN (offgoing nurse). Report included the following information SBAR, Kardex, ED Summary, Intake/Output, MAR, Recent Results, and Cardiac Rhythm NSR .

## 2021-07-31 NOTE — Progress Notes (Signed)
Spiritual Care Assessment/Progress Note  [facility]      NAME: Savannah Irwin      MRN: [id number]  AGE: 75 y.o. SEX: female  Religious Affiliation: Other   Language: English     07/31/2021     Total Time (in minutes): 33     Spiritual Assessment begun in [facility] 3 PROG CARE TELE 2 through conversation with:         [] Patient        [x]  Family    []  Friend(s)        Reason for Consult: Initial/Spiritual assessment, patient floor     Spiritual beliefs: (Please include comment if needed)     [x]  Identifies with a faith tradition: Christian        []  Supported by a faith community:            []  Claims no spiritual orientation:           []  Seeking spiritual identity:                []  Adheres to an individual form of spirituality:           []  Not able to assess:                           Identified resources for coping:      [x]  Prayer                               []  Music                  []  Guided Imagery     [x]  Family/friends                 []  Pet visits     []  Devotional reading                         []  Unknown     []  Other:                                               Interventions offered during this visit: (See comments for more details)          Family/Friend(s): Affirmation of emotions/emotional suffering, Affirmation of faith, Catharsis/review of pertinent events in supportive environment, Iconic (affirming the presence of God/Higher Power), Normalization of emotional/spiritual concerns, Prayer (assurance of)     Plan of Care:     []  Support spiritual and/or cultural needs    []  Support AMD and/or advance care planning process      []  Support grieving process   []  Coordinate Rites and/or Rituals    []  Coordination with community clergy   []  No spiritual needs identified at this time   []  Detailed Plan of Care below (See Comments)  []  Make referral to Music Therapy  []  Make referral to Pet Therapy     []  Make referral to Addiction services  []  Make referral to [facility] Passages  []  Make referral  to Spiritual Care Partner  []  No future visits requested        [x]  Follow up visits as needed     Attempted to visit patient for initial spiritual assessment. Her  chart was consulted. Unable to assess her room as she is on Covid isolation precautions. A call to her room went unanswered. Called and spoke with her niece, [name]. The patient lived in [place] and [place] brought her here to live with her in light of her increasing needs for support. Sunny Schlein is her primary caregiver. The patient is a person of [place] faith and derives a sense of support and hope from her faith.   Clancy Gourd, Chaplain, MDiv, MS, [facility]

## 2021-07-31 NOTE — Consults (Signed)
Completed by Dr. Floranda on 07/28/2021

## 2021-07-31 NOTE — Progress Notes (Signed)
Bedside shift change report given to kara Control and instrumentation engineer) by Serita Kyle (offgoing nurse). Report included the following information SBAR, Med Rec Status, and Cardiac Rhythm NSR .

## 2021-07-31 NOTE — Progress Notes (Signed)
 Problem: Dysphagia (Adult)  Goal: *Acute Goals and Plan of Care (Insert Text)  Description: Swallowing goals initiated 07-28-21:  1) tolerate soft and bite sized diet without s/s aspiration by 07-31-21-discontinued  2) tolerate minced and moist diet , thin liquids without s/s aspiration by 08-05-21  Outcome: Not Progressing Towards Goal   SPEECH LANGUAGE PATHOLOGY DYSPHAGIA TREATMENT  Patient: Savannah Irwin (75 y.o. female)  Date: 07/31/2021  Diagnosis: Fever [R50.9] Fever      Precautions: aspiration Fall, Skin (droplet +)    ASSESSMENT:  Patient not chewing well-still with oral holding. Will downgrade diet. COVID dx makes r/o aspiration difficult. Nutritional risks are moderate to significant.     PLAN:  Recommendations and Planned Interventions:  Downgrade diet to minced and moist, thins  Patient continues to benefit from skilled intervention to address the above impairments.  Continue treatment per established plan of care.  Discharge Recommendations:  Skilled Nursing Facility     SUBJECTIVE:   Patient stated "What kind of milk?".    OBJECTIVE:   Cognitive and Communication Status:  Neurologic State:  (mumbling. speech mostly unintelligible.)  Orientation Level: Oriented to person  Cognition: Follows commands, Poor safety awareness  Perception: Appears intact     Safety/Judgement: Lack of insight into deficits  Dysphagia Treatment:  Oral Assessment:     P.O. Trials:  Patient Position: uprightin bed  Vocal quality prior to P.O.: Low volume  Consistency Presented: Mechanical soft;Puree;Thin liquid (minced)  How Presented: SLP-fed/presented;Spoon;Straw   ORAL PHASE:   Bolus Acceptance: Impaired (unable to feed self most of the time. ?vision, ?cognitive, ?weakness)  Bolus Formation/Control: Impaired (reduced chew effectiveness. will have to downgrade diet)  Type of Impairment: Mastication  Propulsion: Delayed (# of seconds) (wiht solids; some oral holding)  Oral Residue: 10-50% of bolus (with solids)  PHARYNGEAL PHASE:    Initiation of Swallow: No impairment     Aspiration Signs/Symptoms:  (occasional coughing, complicated by COVID dx)                      Exercises:  Laryngeal Exercises:                                                                                                                                   Pain:  Pain Scale 1: Numeric (0 - 10)  Pain Intensity 1: 0       After treatment:   Patient left in no apparent distress in bed and Nursing notified    COMMUNICATION/EDUCATION:   Patient was educated regarding her deficit(s) of dysphagia  as this relates to her diagnosis of COVID.  She demonstrated Guarded understanding as evidenced by lack of insight.    The patient's plan of care including recommendations, planned interventions, and recommended diet changes were discussed with: Registered nurse.     Karna KANDICE Sink, SLP  Time Calculation: 15 mins

## 2021-07-31 NOTE — Progress Notes (Signed)
Physician Progress Note      PATIENTLILLIAHNA, Savannah Irwin  CSN #:                  [id number]  DOB:                       03-19-1946  ADMIT DATE:       07/27/2021 3:22 PM  DISCH DATE:  RESPONDING  PROVIDER #:        Glenetta Hew MD        QUERY TEXT:    Type of Anemia: Please provide further specificity, if known.    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      Electronically signed by:  Glenetta Hew MD 07/31/2021 8:59 AM

## 2021-07-31 NOTE — Progress Notes (Signed)
[place] [facility]  [address] [place], [place] [address]  [phone]      Medical Progress Note      NAME: Savannah Irwin   DOB:  August 03, 1946  MRM:  [id number]    Date/Time: 07/31/2021  12:14 PM           Assessment / Plan:     75 y.o. female with past medical history of hypothyroidism, anxiety, depression, emphesema, who presented on 07/27/2021 with unresponsiveness.    #Unresponsiveness: Likely a syncopal episode. EEG neg for seizures. MRI w/o acute cva. Sx resolved, however, she remains profoundly weak    #COVID Infection: No documented hypoxia, but she is quite weak as a result. Needs SNF but refuses. Spoke with niece today at length, who notes that pt reuluctantly agreed to SNF from [facility], but then checked herself out. Niece, with whom pt lives, notes that her apt is not a great environment for her. Agrees she would benefit from SNF stay    #AKI: 2/2 volume depletion. Resolved    #DM2: A1c was 10% last month, but has required minimal insulin here   - Start metformin 500mg  bid, SSI    #Anxiety/Depression: Cont home meds    #Emphysema: No exacerbation; continues to smoke    Care Plan discussed with: Patient, Family, Care Manager, Nursing Staff, and Consultant/Specialist    Discussed:  Care Plan    Prophylaxis:  Lovenox    Disposition:  SNF/LTC           ___________________________________________________    Attending Physician: , MD        Subjective:     Chief Complaint:  No acute events overnight. She wants to go home but can't get out of bed. She says she has an eye appointment on the 25th that she can't miss.    Spoke later with daughter about living situation and pt's weakness. We agree SNF is in her best interest.     ROS:  (bold if positive, if negative)    Tolerating PT  Tolerating Diet          Objective:       Vitals:          Last 24hrs VS reviewed since prior progress note. Most recent are:    Visit Vitals  BP 132/69   Pulse 96   Temp 98.8 ??F (37.1 ??C)   Resp 11   Ht  5\' 3"  (1.6 m)   Wt 56.8 kg (125 lb 3.2 oz)   SpO2 97%   BMI 22.18 kg/m??     SpO2 Readings from Last 6 Encounters:   07/31/21 97%   06/21/21 95%   03/22/21 95%   02/10/21 94%    O2 Flow Rate (L/min): 2 l/min     Intake/Output Summary (Last 24 hours) at 07/31/2021 1214  Last data filed at 07/31/2021 0954  Gross per 24 hour   Intake 240 ml   Output 650 ml   Net -410 ml          Exam:     Physical Exam:    Gen:  Frail, nad  HEENT:  Pink conjunctivae, PERRL, hearing intact to voice, moist mucous membranes  Neck:  Supple, without masses, thyroid non-tender  Resp:  No accessory muscle use, clear breath sounds without wheezes rales or rhonchi  Card:  No murmurs, normal S1, S2 without thrills, bruits or peripheral edema  Abd:  Soft, non-tender, non-distended, normoactive bowel sounds are present  Musc:  No cyanosis or clubbing  Skin:  No rashes or ulcers, skin turgor is good  Neuro:  Cranial nerves 3-12 are grossly intact, grip strength is 5/5 bilaterally and dorsi / plantarflexion is 5/5 bilaterally, follows commands appropriately  Psych:  Good insight, oriented to person, place and time, alert       Medications Reviewed: (see below)    Lab Data Reviewed: (see below)    ______________________________________________________________________    Medications:     Current Facility-Administered Medications   Medication Dose Route Frequency    insulin lispro (HUMALOG) injection   SubCUTAneous AC&HS    enoxaparin (LOVENOX) injection 40 mg  40 mg SubCUTAneous DAILY    sodium chloride (NS) flush 5-10 mL  5-10 mL IntraVENous PRN    levothyroxine (SYNTHROID) tablet 50 mcg  50 mcg Oral ACB    metoprolol tartrate (LOPRESSOR) tablet 25 mg  25 mg Oral BID    pantoprazole (PROTONIX) tablet 40 mg  40 mg Oral DAILY    sertraline (ZOLOFT) tablet 50 mg  50 mg Oral DAILY    topiramate (TOPAMAX) tablet 25 mg  25 mg Oral BID WITH MEALS    brivaracetam (BRIVIACT) tablet 50 mg  50 mg Oral BID    atorvastatin (LIPITOR) tablet 10 mg  10 mg Oral DAILY     sodium chloride (NS) flush 5-40 mL  5-40 mL IntraVENous Q8H    sodium chloride (NS) flush 5-40 mL  5-40 mL IntraVENous PRN    acetaminophen (TYLENOL) tablet 650 mg  650 mg Oral Q6H PRN    Or    acetaminophen (TYLENOL) suppository 650 mg  650 mg Rectal Q6H PRN    polyethylene glycol (MIRALAX) packet 17 g  17 g Oral DAILY PRN    ondansetron (ZOFRAN ODT) tablet 4 mg  4 mg Oral Q8H PRN    Or    ondansetron (ZOFRAN) injection 4 mg  4 mg IntraVENous Q6H PRN    albuterol-ipratropium (DUO-NEB) 2.5 MG-0.5 MG/3 ML  3 mL Nebulization Q4H PRN    glucose chewable tablet 16 g  4 Tablet Oral PRN    glucagon (GLUCAGEN) injection 1 mg  1 mg IntraMUSCular PRN    dextrose 10% infusion 0-250 mL  0-250 mL IntraVENous PRN            Lab Review:     No results for input(s): WBC, HGB, HCT, PLT, HGBEXT, HCTEXT, PLTEXT in the last 72 hours.  Recent Labs     07/29/21  0403   NA 140   K 3.9   CL 115*   CO2 19*   GLU 112*   BUN 22*   CREA 0.81   CA 8.0*     No components found for: [facility]

## 2021-08-01 LAB — POC LACTIC ACID: Lactic Acid (POC): 0.94 mmol/L (ref 0.40–2.00)

## 2021-08-01 LAB — CBC WITH AUTOMATED DIFF
ABS. BASOPHILS: 0 10*3/uL (ref 0.0–0.1)
ABS. BASOPHILS: 0 10*3/uL (ref 0.0–0.1)
ABS. EOSINOPHILS: 0 10*3/uL (ref 0.0–0.4)
ABS. EOSINOPHILS: 0 10*3/uL (ref 0.0–0.4)
ABS. IMM. GRANS.: 0 10*3/uL (ref 0.00–0.04)
ABS. IMM. GRANS.: 0 10*3/uL (ref 0.00–0.04)
ABS. LYMPHOCYTES: 1.2 10*3/uL (ref 0.8–3.5)
ABS. LYMPHOCYTES: 1.3 10*3/uL (ref 0.8–3.5)
ABS. MONOCYTES: 0.4 10*3/uL (ref 0.0–1.0)
ABS. MONOCYTES: 0.4 10*3/uL (ref 0.0–1.0)
ABS. NEUTROPHILS: 4.1 10*3/uL (ref 1.8–8.0)
ABS. NEUTROPHILS: 4.5 10*3/uL (ref 1.8–8.0)
ABSOLUTE NRBC: 0 10*3/uL (ref 0.00–0.01)
ABSOLUTE NRBC: 0 10*3/uL (ref 0.00–0.01)
BASOPHILS: 1 % (ref 0–1)
BASOPHILS: 1 % (ref 0–1)
EOSINOPHILS: 0 % (ref 0–7)
EOSINOPHILS: 0 % (ref 0–7)
HCT: 33.7 % — ABNORMAL LOW (ref 35.0–47.0)
HCT: 35.5 % (ref 35.0–47.0)
HGB: 10.8 g/dL — ABNORMAL LOW (ref 11.5–16.0)
HGB: 11.3 g/dL — ABNORMAL LOW (ref 11.5–16.0)
IMMATURE GRANULOCYTES: 0 % (ref 0.0–0.5)
IMMATURE GRANULOCYTES: 0 % (ref 0.0–0.5)
LYMPHOCYTES: 21 % (ref 12–49)
LYMPHOCYTES: 21 % (ref 12–49)
MCH: 27.9 PG (ref 26.0–34.0)
MCH: 28.8 PG (ref 26.0–34.0)
MCHC: 32 g/dL (ref 30.0–36.5)
MCHC: 31.8 g/dL (ref 30.0–36.5)
MCV: 87.1 FL (ref 80.0–99.0)
MCV: 90.6 FL (ref 80.0–99.0)
MONOCYTES: 7 % (ref 5–13)
MONOCYTES: 6 % (ref 5–13)
MPV: 8.9 FL (ref 8.9–12.9)
MPV: 9 FL (ref 8.9–12.9)
NEUTROPHILS: 71 % (ref 32–75)
NEUTROPHILS: 72 % (ref 32–75)
NRBC: 0 PER 100 WBC
NRBC: 0 PER 100 WBC
PLATELET: 215 10*3/uL (ref 150–400)
PLATELET: 205 10*3/uL (ref 150–400)
RBC: 3.87 M/uL (ref 3.80–5.20)
RBC: 3.92 M/uL (ref 3.80–5.20)
RDW: 16 % — ABNORMAL HIGH (ref 11.5–14.5)
RDW: 15.8 % — ABNORMAL HIGH (ref 11.5–14.5)
WBC: 5.8 10*3/uL (ref 3.6–11.0)
WBC: 6.3 10*3/uL (ref 3.6–11.0)

## 2021-08-01 LAB — METABOLIC PANEL, COMPREHENSIVE
A-G Ratio: 0.4 — ABNORMAL LOW (ref 1.1–2.2)
A-G Ratio: 0.4 — ABNORMAL LOW (ref 1.1–2.2)
ALT (SGPT): 11 U/L — ABNORMAL LOW (ref 12–78)
ALT (SGPT): 12 U/L (ref 12–78)
AST (SGOT): 10 U/L — ABNORMAL LOW (ref 15–37)
AST (SGOT): 13 U/L — ABNORMAL LOW (ref 15–37)
Albumin: 2.2 g/dL — ABNORMAL LOW (ref 3.5–5.0)
Albumin: 2.3 g/dL — ABNORMAL LOW (ref 3.5–5.0)
Alk. phosphatase: 70 U/L (ref 45–117)
Alk. phosphatase: 73 U/L (ref 45–117)
Anion gap: 11 mmol/L (ref 5–15)
Anion gap: 10 mmol/L (ref 5–15)
BUN/Creatinine ratio: 18 (ref 12–20)
BUN/Creatinine ratio: 19 (ref 12–20)
BUN: 15 MG/DL (ref 6–20)
BUN: 15 MG/DL (ref 6–20)
Bilirubin, total: 0.6 MG/DL (ref 0.2–1.0)
Bilirubin, total: 0.5 MG/DL (ref 0.2–1.0)
CO2: 23 mmol/L (ref 21–32)
CO2: 20 mmol/L — ABNORMAL LOW (ref 21–32)
Calcium: 8.2 MG/DL — ABNORMAL LOW (ref 8.5–10.1)
Calcium: 8.7 MG/DL (ref 8.5–10.1)
Chloride: 98 mmol/L (ref 97–108)
Chloride: 99 mmol/L (ref 97–108)
Creatinine: 0.83 MG/DL (ref 0.55–1.02)
Creatinine: 0.79 MG/DL (ref 0.55–1.02)
GFR est AA: >60 mL/min/{1.73_m2} (ref >60)
GFR est AA: >60 mL/min/{1.73_m2} (ref >60)
GFR est non-AA: >60 mL/min/{1.73_m2} (ref >60)
GFR est non-AA: >60 mL/min/{1.73_m2} (ref >60)
Globulin: 5.4 g/dL — ABNORMAL HIGH (ref 2.0–4.0)
Globulin: 5.6 g/dL — ABNORMAL HIGH (ref 2.0–4.0)
Glucose: 165 mg/dL — ABNORMAL HIGH (ref 65–100)
Glucose: 145 mg/dL — ABNORMAL HIGH (ref 65–100)
Potassium: 3.8 mmol/L (ref 3.5–5.1)
Potassium: 3.8 mmol/L (ref 3.5–5.1)
Protein, total: 7.6 g/dL (ref 6.4–8.2)
Protein, total: 7.9 g/dL (ref 6.4–8.2)
Sodium: 132 mmol/L — ABNORMAL LOW (ref 136–145)
Sodium: 129 mmol/L — ABNORMAL LOW (ref 136–145)

## 2021-08-01 LAB — URINE MICROSCOPIC ONLY

## 2021-08-01 LAB — GLUCOSE, POC
Glucose (POC): 198 mg/dL — ABNORMAL HIGH (ref 65–117)
Glucose (POC): 193 mg/dL — ABNORMAL HIGH (ref 65–117)
Glucose (POC): 149 mg/dL — ABNORMAL HIGH (ref 65–117)
Glucose (POC): 140 mg/dL — ABNORMAL HIGH (ref 65–117)

## 2021-08-01 LAB — URINALYSIS W/ RFLX MICROSCOPIC
Bilirubin, Urine: NEGATIVE
Bilirubin: NEGATIVE
Glucose, Ur: NEGATIVE mg/dL
Glucose: NEGATIVE mg/dL
Nitrite, Urine: POSITIVE — AB
Nitrites: POSITIVE — AB
Protein, UA: 30 mg/dL — AB
Protein: 30 mg/dL — AB
Specific Gravity, UA: 1.015 (ref 1.003–1.030)
Specific gravity: 1.015 (ref 1.003–1.030)
Urobilinogen, UA, POCT: 4 EU/dL — ABNORMAL HIGH (ref 0.2–1.0)
Urobilinogen: 4 EU/dL — ABNORMAL HIGH (ref 0.2–1.0)
pH (UA): 8.5 — ABNORMAL HIGH (ref 5.0–8.0)
pH, UA: 8.5 — ABNORMAL HIGH (ref 5.0–8.0)

## 2021-08-01 LAB — SAMPLES BEING HELD

## 2021-08-01 LAB — LACTIC ACID
Lactic Acid: 1 MMOL/L (ref 0.4–2.0)
Lactic acid: 1 MMOL/L (ref 0.4–2.0)

## 2021-08-01 LAB — C REACTIVE PROTEIN, QT: C-Reactive protein: 15.6 mg/dL — ABNORMAL HIGH (ref 0.00–0.60)

## 2021-08-01 LAB — SED RATE (ESR): Sed rate, automated: 78 mm/hr — ABNORMAL HIGH (ref 0–30)

## 2021-08-01 LAB — CBC WITH AUTO DIFFERENTIAL
Basophils %: 1 % (ref 0–1)
Basophils %: 1 % (ref 0–1)
Basophils Absolute: 0 10*3/uL (ref 0.0–0.1)
Basophils Absolute: 0 10*3/uL (ref 0.0–0.1)
Eosinophils %: 0 % (ref 0–7)
Eosinophils %: 0 % (ref 0–7)
Eosinophils Absolute: 0 10*3/uL (ref 0.0–0.4)
Eosinophils Absolute: 0 10*3/uL (ref 0.0–0.4)
Granulocyte Absolute Count: 0 10*3/uL (ref 0.00–0.04)
Granulocyte Absolute Count: 0 10*3/uL (ref 0.00–0.04)
Hematocrit: 33.7 % — ABNORMAL LOW (ref 35.0–47.0)
Hematocrit: 35.5 % (ref 35.0–47.0)
Hemoglobin: 10.8 g/dL — ABNORMAL LOW (ref 11.5–16.0)
Hemoglobin: 11.3 g/dL — ABNORMAL LOW (ref 11.5–16.0)
Immature Granulocytes: 0 % (ref 0.0–0.5)
Immature Granulocytes: 0 % (ref 0.0–0.5)
Lymphocytes %: 21 % (ref 12–49)
Lymphocytes %: 21 % (ref 12–49)
Lymphocytes Absolute: 1.2 10*3/uL (ref 0.8–3.5)
Lymphocytes Absolute: 1.3 10*3/uL (ref 0.8–3.5)
MCH: 27.9 PG (ref 26.0–34.0)
MCH: 28.8 PG (ref 26.0–34.0)
MCHC: 32 g/dL (ref 30.0–36.5)
MCHC: 31.8 g/dL (ref 30.0–36.5)
MCV: 87.1 FL (ref 80.0–99.0)
MCV: 90.6 FL (ref 80.0–99.0)
MPV: 8.9 FL (ref 8.9–12.9)
MPV: 9 FL (ref 8.9–12.9)
Monocytes %: 7 % (ref 5–13)
Monocytes %: 6 % (ref 5–13)
Monocytes Absolute: 0.4 10*3/uL (ref 0.0–1.0)
Monocytes Absolute: 0.4 10*3/uL (ref 0.0–1.0)
NRBC Absolute: 0 10*3/uL (ref 0.00–0.01)
NRBC Absolute: 0 10*3/uL (ref 0.00–0.01)
Neutrophils %: 71 % (ref 32–75)
Neutrophils %: 72 % (ref 32–75)
Neutrophils Absolute: 4.1 10*3/uL (ref 1.8–8.0)
Neutrophils Absolute: 4.5 10*3/uL (ref 1.8–8.0)
Nucleated RBCs: 0 PER 100 WBC
Nucleated RBCs: 0 PER 100 WBC
Platelets: 215 10*3/uL (ref 150–400)
Platelets: 205 10*3/uL (ref 150–400)
RBC: 3.87 M/uL (ref 3.80–5.20)
RBC: 3.92 M/uL (ref 3.80–5.20)
RDW: 16 % — ABNORMAL HIGH (ref 11.5–14.5)
RDW: 15.8 % — ABNORMAL HIGH (ref 11.5–14.5)
WBC: 5.8 10*3/uL (ref 3.6–11.0)
WBC: 6.3 10*3/uL (ref 3.6–11.0)

## 2021-08-01 LAB — COMPREHENSIVE METABOLIC PANEL
ALT: 11 U/L — ABNORMAL LOW (ref 12–78)
ALT: 12 U/L (ref 12–78)
AST: 10 U/L — ABNORMAL LOW (ref 15–37)
AST: 13 U/L — ABNORMAL LOW (ref 15–37)
Albumin/Globulin Ratio: 0.4 — ABNORMAL LOW (ref 1.1–2.2)
Albumin/Globulin Ratio: 0.4 — ABNORMAL LOW (ref 1.1–2.2)
Albumin: 2.2 g/dL — ABNORMAL LOW (ref 3.5–5.0)
Albumin: 2.3 g/dL — ABNORMAL LOW (ref 3.5–5.0)
Alkaline Phosphatase: 70 U/L (ref 45–117)
Alkaline Phosphatase: 73 U/L (ref 45–117)
Anion Gap: 11 mmol/L (ref 5–15)
Anion Gap: 10 mmol/L (ref 5–15)
BUN: 15 MG/DL (ref 6–20)
BUN: 15 MG/DL (ref 6–20)
Bun/Cre Ratio: 18 (ref 12–20)
Bun/Cre Ratio: 19 (ref 12–20)
CO2: 23 mmol/L (ref 21–32)
CO2: 20 mmol/L — ABNORMAL LOW (ref 21–32)
Calcium: 8.2 MG/DL — ABNORMAL LOW (ref 8.5–10.1)
Calcium: 8.7 MG/DL (ref 8.5–10.1)
Chloride: 98 mmol/L (ref 97–108)
Chloride: 99 mmol/L (ref 97–108)
Creatinine: 0.83 MG/DL (ref 0.55–1.02)
Creatinine: 0.79 MG/DL (ref 0.55–1.02)
EGFR IF NonAfrican American: >60 mL/min/{1.73_m2} (ref >60)
EGFR IF NonAfrican American: >60 mL/min/{1.73_m2} (ref >60)
GFR African American: >60 mL/min/{1.73_m2} (ref >60)
GFR African American: >60 mL/min/{1.73_m2} (ref >60)
Globulin: 5.4 g/dL — ABNORMAL HIGH (ref 2.0–4.0)
Globulin: 5.6 g/dL — ABNORMAL HIGH (ref 2.0–4.0)
Glucose: 165 mg/dL — ABNORMAL HIGH (ref 65–100)
Glucose: 145 mg/dL — ABNORMAL HIGH (ref 65–100)
Potassium: 3.8 mmol/L (ref 3.5–5.1)
Potassium: 3.8 mmol/L (ref 3.5–5.1)
Sodium: 132 mmol/L — ABNORMAL LOW (ref 136–145)
Sodium: 129 mmol/L — ABNORMAL LOW (ref 136–145)
Total Bilirubin: 0.6 MG/DL (ref 0.2–1.0)
Total Bilirubin: 0.5 MG/DL (ref 0.2–1.0)
Total Protein: 7.6 g/dL (ref 6.4–8.2)
Total Protein: 7.9 g/dL (ref 6.4–8.2)

## 2021-08-01 LAB — POCT GLUCOSE
POC Glucose: 198 mg/dL — ABNORMAL HIGH (ref 65–117)
POC Glucose: 193 mg/dL — ABNORMAL HIGH (ref 65–117)
POC Glucose: 149 mg/dL — ABNORMAL HIGH (ref 65–117)
POC Glucose: 140 mg/dL — ABNORMAL HIGH (ref 65–117)

## 2021-08-01 LAB — SEDIMENTATION RATE: Sed Rate: 78 mm/hr — ABNORMAL HIGH (ref 0–30)

## 2021-08-01 LAB — POCT LACTIC ACID: POC Lactic Acid: 0.94 mmol/L (ref 0.40–2.00)

## 2021-08-01 LAB — C-REACTIVE PROTEIN: CRP: 15.6 mg/dL — ABNORMAL HIGH (ref 0.00–0.60)

## 2021-08-01 MED ORDER — DEXAMETHASONE 6 MG TAB
6 mg | Freq: Every day | ORAL | Status: DC
Start: 2021-08-01 — End: 2021-08-02
  Administered 2021-08-01: 21:00:00 via ORAL

## 2021-08-01 MED ORDER — CEFTRIAXONE 1 GRAM SOLUTION FOR INJECTION
1 gram | INTRAMUSCULAR | Status: DC
Start: 2021-08-01 — End: 2021-08-01
  Administered 2021-08-01: 12:00:00 via INTRAVENOUS

## 2021-08-01 MED ORDER — LACTATED RINGERS IV
INTRAVENOUS | Status: DC
Start: 2021-08-01 — End: 2021-08-04
  Administered 2021-08-01 – 2021-08-04 (×7): via INTRAVENOUS

## 2021-08-01 MED ORDER — SODIUM CHLORIDE 0.9 % IV PIGGY BACK
2 gram | INTRAVENOUS | Status: DC
Start: 2021-08-01 — End: 2021-08-04
  Administered 2021-08-02 – 2021-08-03 (×2): via INTRAVENOUS

## 2021-08-01 MED ORDER — SODIUM CHLORIDE 0.9 % IV PIGGY BACK
1 gram | Freq: Two times a day (BID) | INTRAVENOUS | Status: DC
Start: 2021-08-01 — End: 2021-08-01
  Administered 2021-08-01: 15:00:00 via INTRAVENOUS

## 2021-08-01 MED ORDER — SODIUM CHLORIDE 0.9 % INJECTION
2 gram | INTRAMUSCULAR | Status: AC
Start: 2021-08-01 — End: 2021-08-01
  Administered 2021-08-01: 16:00:00 via INTRAVENOUS

## 2021-08-01 MED ORDER — LACTATED RINGERS BOLUS IV
Freq: Once | INTRAVENOUS | Status: AC
Start: 2021-08-01 — End: 2021-08-01
  Administered 2021-08-01: 16:00:00 via INTRAVENOUS

## 2021-08-01 MED FILL — ATORVASTATIN 10 MG TAB: 10 mg | ORAL | Qty: 1

## 2021-08-01 MED FILL — METOPROLOL TARTRATE 25 MG TAB: 25 mg | ORAL | Qty: 1

## 2021-08-01 MED FILL — METFORMIN 500 MG TAB: 500 mg | ORAL | Qty: 1

## 2021-08-01 MED FILL — INSULIN LISPRO 100 UNIT/ML INJECTION: 100 unit/mL | SUBCUTANEOUS | Qty: 1

## 2021-08-01 MED FILL — TOPIRAMATE 25 MG TAB: 25 mg | ORAL | Qty: 1

## 2021-08-01 MED FILL — INSULIN LISPRO 100 UNIT/ML INJECTION: 100 unit/mL | SUBCUTANEOUS | Qty: 2

## 2021-08-01 MED FILL — ENOXAPARIN 40 MG/0.4 ML SUB-Q SYRINGE: 40 mg/0.4 mL | SUBCUTANEOUS | Qty: 0.4

## 2021-08-01 MED FILL — LACTATED RINGERS IV: INTRAVENOUS | Qty: 1000

## 2021-08-01 MED FILL — BRIVIACT 25 MG TABLET: 25 mg | ORAL | Qty: 2

## 2021-08-01 MED FILL — ACETAMINOPHEN 325 MG TABLET: 325 mg | ORAL | Qty: 2

## 2021-08-01 MED FILL — DEXAMETHASONE 6 MG TAB: 6 mg | ORAL | Qty: 1

## 2021-08-01 MED FILL — LEVOTHYROXINE 50 MCG TAB: 50 mcg | ORAL | Qty: 1

## 2021-08-01 MED FILL — CEFEPIME 2 GRAM SOLUTION FOR INJECTION: 2 gram | INTRAMUSCULAR | Qty: 2

## 2021-08-01 MED FILL — CEFTRIAXONE 1 GRAM SOLUTION FOR INJECTION: 1 gram | INTRAMUSCULAR | Qty: 1

## 2021-08-01 MED FILL — PANTOPRAZOLE 40 MG TAB, DELAYED RELEASE: 40 mg | ORAL | Qty: 1

## 2021-08-01 MED FILL — SERTRALINE 50 MG TAB: 50 mg | ORAL | Qty: 1

## 2021-08-01 NOTE — Progress Notes (Signed)
1100: Responded to RRT Code S in room 324 which was changed to Code Sepsis per Dr Valetta Mole. Patient being treated for UTI, now spiking fever of 100.5, HR 100, RR 26. Does not meet sepsis criteria d/t not having an organ dysfunction, but does have 2 SIRS. Will draw paired blood cultures/POC lactic/CBC/CMP/ESR and CRP per MD.     1120: POC lactic 0.94. Dr Valetta Mole notified via perfect serve message

## 2021-08-01 NOTE — Progress Notes (Signed)
Care Management follow up    Patient admitted for  found unresponsive at home. Acute encephalopathy, SIRS, sepsis, acute renal failure.  Hx - hypothyroidism, anxiety, depression, COPD, diabetes, pulmonary HTN, CVA with left sided weakness, seizures.    COVID Vaccine:  yes, vaccine x 2, no booster    RUR 14 (Score %) low   Is This a Readmission NO  Is this a Bundle NO    Current status  Patient discussed during interdisciplinary rounds.  Patient continues to require medical management including ongoing assessment and monitoring.  Continues on IV antibiotics. Rapid response this am. Unable to participate in PT/OT today, await current evaluations to assist with planning.    Transition of Care Plan  Monitor patient status and response to treatment.  Patient continues to require medical management.  CM needs: unsure at this time.  [facility] home health in the past.  Await current PT/OT evaluations.  CM to monitor progress and recommendations.    Midge Aver, RN, MSN/Care manager

## 2021-08-01 NOTE — Progress Notes (Signed)
Occupational Therapy Note:  Chart reviewed.  Patient had a RRT (changed to code S initially and then to code sepsis) this AM.  She is not medically stable for OT evaluation at this time.  Will continue to follow.  Waunita Schooner, OTR/L

## 2021-08-01 NOTE — Progress Notes (Signed)
[place]  [address] [place], [place] [address]  [phone]      Medical Progress Note      NAME: Savannah Irwin   DOB:  Feb 10, 1946  MRM:  [id number]    Date/Time: 08/01/2021           Assessment / Plan:     75 y.o. female with past medical history of hypothyroidism, anxiety, depression, emphesema, who presented on 07/27/2021 with unresponsiveness.    #Unresponsiveness: Likely a syncopal episode. EEG neg for seizures. MRI w/o acute cva. Sx resolved, however, she remains profoundly weak. Worsened today as noted before    #Sepsis: Due to UTI vs covid. Had started ceftriaxone, but with worsening mental status and fever today, will broaden abx, obtain blood cultures. Lactate reassuring, but clinically slightly volume down so will give 1L LR    - Cefepime   - Fu Ucx, Bcx    #Polyarthralgia: Pain with movement of most extremities. 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 May benefit from steroids    #COVID Infection: No truy documented hypoxia, but she is quite weak as a result. Needs SNF but refuses. Spoke with niece at length, who notes that pt reuluctantly agreed to SNF from [facility], but then checked herself out. Niece, with whom pt lives, notes that her apt is not a great environment for her. Agrees she would benefit from SNF stay    #AKI: 2/2 volume depletion. 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 This morning, she is noted to be less responsive, but still alert. She appears in pain on my exam, pain with moving extremities. She denies cough, sob, abd pain, n/v. Fever and slight tachycardia noted.     ROS:  (bold if positive, if negative)           Objective:       Vitals:          Last 24hrs VS reviewed since prior progress note. Most recent are:    Visit Vitals  BP (!) 169/89 (BP 1 Location: Right upper arm, BP Patient Position: At rest)   Pulse (!) 103   Temp 99.5 ??F (37.5 ??C)   Resp 20   Ht 5\' 3"  (1.6 m)   Wt 56.8 kg (125 lb 3.2 oz)   SpO2 100%   BMI 22.18 kg/m??     SpO2 Readings from Last 6 Encounters:   08/01/21 100%   06/21/21 95%   03/22/21 95%   02/10/21 94%    O2 Flow Rate (L/min): 2 l/min     Intake/Output Summary (Last 24 hours) at 08/01/2021 0725  Last data filed at 08/01/2021 0407  Gross per 24 hour   Intake 240 ml   Output 850 ml  Net -610 ml            Exam:     Physical Exam:    Gen:  Frail, nad  HEENT:  Pink conjunctivae, PERRL, hearing intact to voice, moist mucous membranes  Neck:  Supple, without masses, thyroid non-tender  Resp:  No accessory muscle use, clear breath sounds without wheezes rales or rhonchi  Card:  No murmurs, normal S1, S2 without thrills, bruits or peripheral edema  Abd:  Soft, non-tender, non-distended, normoactive bowel sounds are present  Musc:  No cyanosis or clubbing, mild warmth to bilat knees, bilat MCPs w/ warmth and synovitis  Skin:  No rashes or ulcers, skin turgor is good  Neuro:  Cranial nerves 3-12 are grossly intact, grip strength is 5/5 bilaterally and dorsi / plantarflexion is 5/5 bilaterally, follows commands appropriately  Psych:  Good insight, oriented to person, place and time, alert       Medications Reviewed: (see below)    Lab Data Reviewed: (see below)    ______________________________________________________________________    Medications:     Current Facility-Administered Medications   Medication Dose Route  Frequency    cefTRIAXone (ROCEPHIN) 1 g in 0.9% sodium chloride 10 mL IV syringe  1 g IntraVENous Q24H    metFORMIN (GLUCOPHAGE) tablet 500 mg  500 mg Oral BID WITH MEALS    dextrose 10% infusion 0-250 mL  0-250 mL IntraVENous PRN    insulin lispro (HUMALOG) injection   SubCUTAneous AC&HS    enoxaparin (LOVENOX) injection 40 mg  40 mg SubCUTAneous DAILY    sodium chloride (NS) flush 5-10 mL  5-10 mL IntraVENous PRN    levothyroxine (SYNTHROID) tablet 50 mcg  50 mcg Oral ACB    metoprolol tartrate (LOPRESSOR) tablet 25 mg  25 mg Oral BID    pantoprazole (PROTONIX) tablet 40 mg  40 mg Oral DAILY    sertraline (ZOLOFT) tablet 50 mg  50 mg Oral DAILY    topiramate (TOPAMAX) tablet 25 mg  25 mg Oral BID WITH MEALS    brivaracetam (BRIVIACT) tablet 50 mg  50 mg Oral BID    atorvastatin (LIPITOR) tablet 10 mg  10 mg Oral DAILY    sodium chloride (NS) flush 5-40 mL  5-40 mL IntraVENous Q8H    sodium chloride (NS) flush 5-40 mL  5-40 mL IntraVENous PRN    acetaminophen (TYLENOL) tablet 650 mg  650 mg Oral Q6H PRN    Or    acetaminophen (TYLENOL) suppository 650 mg  650 mg Rectal Q6H PRN    polyethylene glycol (MIRALAX) packet 17 g  17 g Oral DAILY PRN    ondansetron (ZOFRAN ODT) tablet 4 mg  4 mg Oral Q8H PRN    Or    ondansetron (ZOFRAN) injection 4 mg  4 mg IntraVENous Q6H PRN    albuterol-ipratropium (DUO-NEB) 2.5 MG-0.5 MG/3 ML  3 mL Nebulization Q4H PRN    glucose chewable tablet 16 g  4 Tablet Oral PRN    glucagon (GLUCAGEN) injection 1 mg  1 mg IntraMUSCular PRN    dextrose 10% infusion 0-250 mL  0-250 mL IntraVENous PRN            Lab Review:     No results for input(s): WBC, HGB, HCT, PLT, HGBEXT, HCTEXT, PLTEXT, HGBEXT, HCTEXT, PLTEXT in the last 72 hours.  No results for input(s): NA, K, CL, CO2, GLU, BUN, CREA, CA, MG, PHOS, ALB, TBIL, ALT, INR, INREXT, INREXT in the last 72 hours.  No lab exists for component: SGOT    No components found for: [facility]

## 2021-08-01 NOTE — Progress Notes (Signed)
 0700 Bedside and Verbal shift change report given to [facility] RN (oncoming nurse) by Saddie RN (offgoing nurse). Report included the following information SBAR, Kardex, ED Summary, Intake/Output, MAR, Recent Results, and Cardiac Rhythm NSR .     This patient was assisted with Intentional Toileting every 2 hours during this shift as appropriate.  Documentation of ambulation and output reflected on Flowsheet as appropriate.  Purposeful hourly rounding was completed using AIDET and 5Ps.  Outcomes of PHR documented as they occurred. Bed alarm in use as appropriate.  Dual Suction and ambubag in place.    0732 Lab to process urine culture obtained yesterday during straight cath.    1100 Pt in bed, not responding to RN questions. Saying yes when asked what year it is. Pt grunting with any movement. Called Rapid Response. Changed RRT to Code stroke due to Pt not being able to speak. Dr. Geoffrey at bedside states this is encepalopathy related, MD cancels code stroke. Notified MD of 1030 vital signs and elevated temperature. Orders to initiate code sepsis. Labs obtained and Bolus/ABX to be initiated.    1128 Notified Dr. Geoffrey that Pt about criteria for sepsis fluid resuscitation and of Lactic acid level. Ok to continue with giving 1L fluid bolus per MD.     1230 IV bolus complete, notified Dr. Geoffrey about Pt Vital signs post fluids and Pt orientation status being unchanged, no further orders at this time. Will continue to monitor.    1930 Bedside and Verbal shift change report given to Ulla RN (oncoming nurse) by Almetta RN (offgoing nurse). Report included the following information SBAR, Kardex, ED Summary, Intake/Output, MAR, Recent Results, and Cardiac Rhythm NSR/ST .  During change of shift report, Pt struggling to answer A&O questions, advised night shift RN to call MD about changes. Night shift RN to assess.

## 2021-08-01 NOTE — Progress Notes (Signed)
Response to Rapid Response in 324. No family present. Rapid cancelled upon chaplain's arrival. Provided ministry of presence, silent prayer and collaborated with staff.     Visited by: Katharina Caper. Marguerite Olea, [facility]  Chaplain paging Service [phone])

## 2021-08-01 NOTE — Progress Notes (Signed)
 Progress  Notes by Clorinda, Marina  K at 08/01/21 1104                Author: Clorinda, Marina  K  Service: Pharmacist  Author Type: Pharmacist       Filed: 08/01/21 1104  Date of Service: 08/01/21 1104  Status: Signed          Editor: Clorinda, Marina  K (Pharmacist)                    Pharmacy Note       Cefepime 1000 mg IV q12h ordered for treatment of UTI. Per [facility] Renal / Extended Infusion B Lactam Policy, Cefepime will be changed to 2000 mg IV push x 1 followed by 2000 mg IV q24h over 4 hours.       Estimated Creatinine Clearance: Estimated Creatinine Clearance: 47.7 mL/min (based on SCr of 0.83 mg/dL).   Dialysis Status, AKI, CKD:      BMI:  Body mass index is 22.18 kg/m.        Recent Labs           08/01/21   0820        WBC  5.8        Temp (24hrs), Avg:99.3 F (37.4 C), Min:97.9 F (36.6 C), Max:100.5 F (38.1 C)         Rationale for Adjustment:  Extended infusion and renal policy .      Pharmacy will continue to monitor and adjust dose as necessary.        Please call with any questions.      Thank you,   Marina  K Clorinda

## 2021-08-01 NOTE — Progress Notes (Signed)
1930 Bedside shift change report given to Ivor Messier RN (oncoming nurse) by Serita Kyle RN (offgoing nurse). Report included the following information SBAR, Intake/Output, MAR, Recent Results, and Cardiac Rhythm NSR .      0700 Bedside shift change report given to [facility] RN (oncoming nurse) by Nechama Guard (offgoing nurse). Report included the following information SBAR, Intake/Output, MAR, Recent Results, and Cardiac Rhythm NSR .      This patient was assisted with Intentional Toileting every 2 hours during this shift as appropriate.  Documentation of ambulation and output reflected on Flowsheet as appropriate.  Purposeful hourly rounding was completed using AIDET and 5Ps.  Outcomes of PHR documented as they occurred. Bed alarm in use as appropriate.  Dual Suction and ambubag in place.

## 2021-08-02 LAB — GLUCOSE, POC
Glucose (POC): 159 mg/dL — ABNORMAL HIGH (ref 65–117)
Glucose (POC): 134 mg/dL — ABNORMAL HIGH (ref 65–117)
Glucose (POC): 177 mg/dL — ABNORMAL HIGH (ref 65–117)
Glucose (POC): 176 mg/dL — ABNORMAL HIGH (ref 65–117)

## 2021-08-02 LAB — CBC WITH AUTOMATED DIFF
ABS. BASOPHILS: 0 10*3/uL (ref 0.0–0.1)
ABS. EOSINOPHILS: 0 10*3/uL (ref 0.0–0.4)
ABS. IMM. GRANS.: 0 10*3/uL (ref 0.00–0.04)
ABS. LYMPHOCYTES: 1.3 10*3/uL (ref 0.8–3.5)
ABS. MONOCYTES: 0.3 10*3/uL (ref 0.0–1.0)
ABS. NEUTROPHILS: 3.4 10*3/uL (ref 1.8–8.0)
ABSOLUTE NRBC: 0 10*3/uL (ref 0.00–0.01)
BASOPHILS: 0 % (ref 0–1)
EOSINOPHILS: 0 % (ref 0–7)
HCT: 35.7 % (ref 35.0–47.0)
HGB: 11.4 g/dL — ABNORMAL LOW (ref 11.5–16.0)
IMMATURE GRANULOCYTES: 0 % (ref 0.0–0.5)
LYMPHOCYTES: 25 % (ref 12–49)
MCH: 27.7 PG (ref 26.0–34.0)
MCHC: 31.9 g/dL (ref 30.0–36.5)
MCV: 86.9 FL (ref 80.0–99.0)
MONOCYTES: 6 % (ref 5–13)
MPV: 8.4 FL — ABNORMAL LOW (ref 8.9–12.9)
NEUTROPHILS: 69 % (ref 32–75)
NRBC: 0 PER 100 WBC
PLATELET: 241 10*3/uL (ref 150–400)
RBC: 4.11 M/uL (ref 3.80–5.20)
RDW: 15.8 % — ABNORMAL HIGH (ref 11.5–14.5)
WBC: 5 10*3/uL (ref 3.6–11.0)

## 2021-08-02 LAB — METABOLIC PANEL, COMPREHENSIVE
A-G Ratio: 0.4 — ABNORMAL LOW (ref 1.1–2.2)
ALT (SGPT): 10 U/L — ABNORMAL LOW (ref 12–78)
AST (SGOT): 12 U/L — ABNORMAL LOW (ref 15–37)
Albumin: 2.1 g/dL — ABNORMAL LOW (ref 3.5–5.0)
Alk. phosphatase: 68 U/L (ref 45–117)
Anion gap: 8 mmol/L (ref 5–15)
BUN/Creatinine ratio: 21 — ABNORMAL HIGH (ref 12–20)
BUN: 15 MG/DL (ref 6–20)
Bilirubin, total: 0.5 MG/DL (ref 0.2–1.0)
CO2: 24 mmol/L (ref 21–32)
Calcium: 8.7 MG/DL (ref 8.5–10.1)
Chloride: 102 mmol/L (ref 97–108)
Creatinine: 0.72 MG/DL (ref 0.55–1.02)
GFR est AA: >60 mL/min/{1.73_m2} (ref >60)
GFR est non-AA: >60 mL/min/{1.73_m2} (ref >60)
Globulin: 5.5 g/dL — ABNORMAL HIGH (ref 2.0–4.0)
Glucose: 160 mg/dL — ABNORMAL HIGH (ref 65–100)
Potassium: 4.2 mmol/L (ref 3.5–5.1)
Protein, total: 7.6 g/dL (ref 6.4–8.2)
Sodium: 134 mmol/L — ABNORMAL LOW (ref 136–145)

## 2021-08-02 LAB — CULTURE, BLOOD
Culture result:: NO GROWTH
Culture result:: NO GROWTH

## 2021-08-02 LAB — CBC WITH AUTO DIFFERENTIAL
Basophils %: 0 % (ref 0–1)
Basophils Absolute: 0 10*3/uL (ref 0.0–0.1)
Eosinophils %: 0 % (ref 0–7)
Eosinophils Absolute: 0 10*3/uL (ref 0.0–0.4)
Granulocyte Absolute Count: 0 10*3/uL (ref 0.00–0.04)
Hematocrit: 35.7 % (ref 35.0–47.0)
Hemoglobin: 11.4 g/dL — ABNORMAL LOW (ref 11.5–16.0)
Immature Granulocytes: 0 % (ref 0.0–0.5)
Lymphocytes %: 25 % (ref 12–49)
Lymphocytes Absolute: 1.3 10*3/uL (ref 0.8–3.5)
MCH: 27.7 PG (ref 26.0–34.0)
MCHC: 31.9 g/dL (ref 30.0–36.5)
MCV: 86.9 FL (ref 80.0–99.0)
MPV: 8.4 FL — ABNORMAL LOW (ref 8.9–12.9)
Monocytes %: 6 % (ref 5–13)
Monocytes Absolute: 0.3 10*3/uL (ref 0.0–1.0)
NRBC Absolute: 0 10*3/uL (ref 0.00–0.01)
Neutrophils %: 69 % (ref 32–75)
Neutrophils Absolute: 3.4 10*3/uL (ref 1.8–8.0)
Nucleated RBCs: 0 PER 100 WBC
Platelets: 241 10*3/uL (ref 150–400)
RBC: 4.11 M/uL (ref 3.80–5.20)
RDW: 15.8 % — ABNORMAL HIGH (ref 11.5–14.5)
WBC: 5 10*3/uL (ref 3.6–11.0)

## 2021-08-02 LAB — CULTURE, BLOOD 1
Culture: NO GROWTH
Culture: NO GROWTH

## 2021-08-02 LAB — COMPREHENSIVE METABOLIC PANEL
ALT: 10 U/L — ABNORMAL LOW (ref 12–78)
AST: 12 U/L — ABNORMAL LOW (ref 15–37)
Albumin/Globulin Ratio: 0.4 — ABNORMAL LOW (ref 1.1–2.2)
Albumin: 2.1 g/dL — ABNORMAL LOW (ref 3.5–5.0)
Alkaline Phosphatase: 68 U/L (ref 45–117)
Anion Gap: 8 mmol/L (ref 5–15)
BUN: 15 MG/DL (ref 6–20)
Bun/Cre Ratio: 21 — ABNORMAL HIGH (ref 12–20)
CO2: 24 mmol/L (ref 21–32)
Calcium: 8.7 MG/DL (ref 8.5–10.1)
Chloride: 102 mmol/L (ref 97–108)
Creatinine: 0.72 MG/DL (ref 0.55–1.02)
EGFR IF NonAfrican American: >60 mL/min/{1.73_m2} (ref >60)
GFR African American: >60 mL/min/{1.73_m2} (ref >60)
Globulin: 5.5 g/dL — ABNORMAL HIGH (ref 2.0–4.0)
Glucose: 160 mg/dL — ABNORMAL HIGH (ref 65–100)
Potassium: 4.2 mmol/L (ref 3.5–5.1)
Sodium: 134 mmol/L — ABNORMAL LOW (ref 136–145)
Total Bilirubin: 0.5 MG/DL (ref 0.2–1.0)
Total Protein: 7.6 g/dL (ref 6.4–8.2)

## 2021-08-02 LAB — POCT GLUCOSE
POC Glucose: 159 mg/dL — ABNORMAL HIGH (ref 65–117)
POC Glucose: 134 mg/dL — ABNORMAL HIGH (ref 65–117)
POC Glucose: 177 mg/dL — ABNORMAL HIGH (ref 65–117)
POC Glucose: 176 mg/dL — ABNORMAL HIGH (ref 65–117)

## 2021-08-02 MED ORDER — METHYLPREDNISOLONE (PF) 40 MG/ML IJ SOLR
40 mg/mL | Freq: Four times a day (QID) | INTRAMUSCULAR | Status: DC
Start: 2021-08-02 — End: 2021-08-04
  Administered 2021-08-02 – 2021-08-04 (×9): via INTRAVENOUS

## 2021-08-02 MED FILL — SOLU-MEDROL (PF) 40 MG/ML SOLUTION FOR INJECTION: 40 mg/mL | INTRAMUSCULAR | Qty: 1

## 2021-08-02 MED FILL — TOPIRAMATE 25 MG TAB: 25 mg | ORAL | Qty: 1

## 2021-08-02 MED FILL — ATORVASTATIN 10 MG TAB: 10 mg | ORAL | Qty: 1

## 2021-08-02 MED FILL — METOPROLOL TARTRATE 25 MG TAB: 25 mg | ORAL | Qty: 1

## 2021-08-02 MED FILL — BRIVIACT 25 MG TABLET: 25 mg | ORAL | Qty: 2

## 2021-08-02 MED FILL — SERTRALINE 50 MG TAB: 50 mg | ORAL | Qty: 1

## 2021-08-02 MED FILL — METFORMIN 500 MG TAB: 500 mg | ORAL | Qty: 1

## 2021-08-02 MED FILL — LEVOTHYROXINE 50 MCG TAB: 50 mcg | ORAL | Qty: 1

## 2021-08-02 MED FILL — PANTOPRAZOLE 40 MG TAB, DELAYED RELEASE: 40 mg | ORAL | Qty: 1

## 2021-08-02 MED FILL — INSULIN LISPRO 100 UNIT/ML INJECTION: 100 unit/mL | SUBCUTANEOUS | Qty: 2

## 2021-08-02 MED FILL — ENOXAPARIN 40 MG/0.4 ML SUB-Q SYRINGE: 40 mg/0.4 mL | SUBCUTANEOUS | Qty: 0.4

## 2021-08-02 MED FILL — CEFEPIME 2 GRAM SOLUTION FOR INJECTION: 2 gram | INTRAMUSCULAR | Qty: 2

## 2021-08-02 NOTE — Progress Notes (Signed)
0700 Bedside and Verbal shift change report given to [facility] RN (oncoming nurse) by Nechama Guard (offgoing nurse). Report included the following information SBAR, Kardex, ED Summary, Intake/Output, MAR, Recent Results, and Cardiac Rhythm NSR/ST .     This patient was assisted with Intentional Toileting every 2 hours during this shift as appropriate.  Documentation of ambulation and output reflected on Flowsheet as appropriate.  Purposeful hourly rounding was completed using AIDET and 5Ps.  Outcomes of PHR documented as they occurred. Bed alarm in use as appropriate.  Dual Suction and ambubag in place.    1930 Bedside and Verbal shift change report given to Ivor Messier RN (oncoming nurse) by Serita Kyle RN (offgoing nurse). Report included the following information SBAR, Kardex, ED Summary, Intake/Output, MAR, Recent Results, and Cardiac Rhythm NSR/ST .

## 2021-08-02 NOTE — Progress Notes (Signed)
 Problem: Mobility Impaired (Adult and Pediatric)  Goal: *Acute Goals and Plan of Care (Insert Text)  Description: FUNCTIONAL STATUS PRIOR TO ADMISSION: Pt states independent baseline ambulation within the home and mod I gait using cane in community. Per care manager, niece reports pt requires 24-hour supervision/assist for safety but not necessarily physical assistance.    HOME SUPPORT PRIOR TO ADMISSION: The patient lived with niece and niece's daughter. (Confirmed by care manager.) She reports son also provides intermittent assistance.    Physical Therapy Goals  Initiated 07/28/2021  1.  Patient will move from supine to sit and sit to supine  in bed with moderate assistance  within 7 day(s).    2.  Patient will transfer from bed to chair and chair to bed with moderate assistance  using the least restrictive device within 7 day(s).  3.  Patient will perform sit to stand with moderate assistance  within 7 day(s).  4.  Patient will ambulate with moderate assistance  for 15 feet with the least restrictive device within 7 day(s).       Outcome: Progressing Towards Goal   PHYSICAL THERAPY TREATMENT  Patient: Savannah Irwin (75 y.o. female)  Date: 08/02/2021  Diagnosis: Fever [R50.9] Fever      Precautions: Fall, Skin (droplet +)  Chart, physical therapy assessment, plan of care and goals were reviewed.    ASSESSMENT  Patient continues with skilled PT services and is progressing towards goals. Patient received on RA with sats in mid 90s, agreeable to transfer OOB after mild encouragement.  Overall doing better today with less assistance needed for bed mobility, good siting balance noted.  Needed HHA/MIN A x 2 to come to standing with increased trunk sway noted with gait x 3 feet to chair.  Would benefit from trial with RW next session.  Noted desaturation to lower 80s with activity, replaced O2 at 2L and rebounded quickly to high 90s.  Cont to recommend SNF rehab and discussed with patient as currently a very high fall  risk and well below her baseline.  Family notes unable to care for her in current mobility level.     Current Level of Function Impacting Discharge (mobility/balance): MIN A bed mob (extra time), MIN A x 2 transfers and gait with HHA    Other factors to consider for discharge: family's ability to care for patient, fall risk         PLAN :  Patient continues to benefit from skilled intervention to address the above impairments.  Continue treatment per established plan of care.  to address goals.    Recommendation for discharge: (in order for the patient to meet his/her long term goals)  Therapy up to 5 days/week in SNF setting    This discharge recommendation:  Has been made in collaboration with the attending provider and/or case management    IF patient discharges home will need the following DME: to be determined (TBD)       SUBJECTIVE:   Patient stated "I can go home today and be fine."    OBJECTIVE DATA SUMMARY:   Critical Behavior:  Neurologic State: Alert  Orientation Level: Oriented to person  Cognition: Decreased attention/concentration, Follows commands  Safety/Judgement: Decreased insight into deficits  Functional Mobility Training:  Bed Mobility:     Supine to Sit: Minimum assistance;Assist x2     Scooting: Contact guard assistance        Transfers:  Sit to Stand: Minimum assistance;Assist x2  Stand to Sit: Minimum assistance;Assist  x2        Bed to Chair: Minimum assistance;Assist x2                    Balance:  Sitting: Intact  Standing: Impaired  Standing - Static: Constant support  Standing - Dynamic : Fair;Constant support  Ambulation/Gait Training:  Distance (ft): 3 Feet (ft)  Assistive Device: Gait belt (B HHA)  Ambulation - Level of Assistance: Minimal assistance;Assist x2        Gait Abnormalities: Decreased step clearance;Shuffling gait        Base of Support: Narrowed     Speed/Cadence: Slow  Step Length: Right shortened;Left shortened                      Pain Rating:  Pain all over, FACES  4/10    Activity Tolerance:   Fair and desaturates with exertion and requires oxygen    After treatment patient left in no apparent distress:   Sitting in chair, Call bell within reach, and Bed / chair alarm activated    COMMUNICATION/COLLABORATION:   The patient's plan of care was discussed with: Occupational therapist and Registered nurse.     Alan JINNY Shaggy, PT   Time Calculation: 39 mins

## 2021-08-02 NOTE — Progress Notes (Signed)
 Problem: Self Care Deficits Care Plan (Adult)  Goal: *Acute Goals and Plan of Care (Insert Text)  Description: FUNCTIONAL STATUS PRIOR TO ADMISSION: Patient was independent and active without use of DME.     HOME SUPPORT: Patient lived with niece.    Occupational Therapy Goals  Initiated 08/02/2021  1.  Patient will perform lower body dressing with minimal assistance/contact guard assist within 7 day(s).  2.  Patient will perform grooming with supervision/set-up within 7 day(s).  3.  Patient will perform upper body dressing with supervision/set-up within 7 day(s).  4.  Patient will perform toilet transfers with minimal assistance/contact guard assist within 7 day(s).  5.  Patient will perform all aspects of toileting with minimal assistance/contact guard assist within 7 day(s).  6.  Patient will utilize energy conservation techniques during functional activities with verbal cues within 7 day(s).    Outcome: Progressing Towards Goal   OCCUPATIONAL THERAPY EVALUATION  Patient: Savannah Irwin (75 y.o. female)  Date: 08/02/2021  Primary Diagnosis: Fever [R50.9]       Precautions:   Fall, Skin (droplet +)    ASSESSMENT  Based on the objective data described below, the patient presents with hospital admission secondary to fever and unresponsiveness at home.  Patient lives with niece who reports increased difficulty caring for patient, though patient reports independence.  Patient today agreeable to activity with encouragement.  Patient to EOB with min assist.  She is able to stand with min x 2 and transfer to chair with same level.  She requires up to max assist for LE ADLs. Patient O2 sats decreased with activity, and O2 applied.  Patient sats improved to mid 90s with 2L. Patient left in chair in NAD at end of session..    Current Level of Function Impacting Discharge (ADLs/self-care): up to max assist for ADL, Min A x 2 transfers     Functional Outcome Measure:  The patient scored 15/100 on the Barthel Index outcome  measure.      Other factors to consider for discharge: lives with family     Patient will benefit from skilled therapy intervention to address the above noted impairments.       PLAN :  Recommendations and Planned Interventions: self care training, functional mobility training, therapeutic exercise, balance training, therapeutic activities, endurance activities, patient education, home safety training, and family training/education    Frequency/Duration: Patient will be followed by occupational therapy 5 times a week to address goals.    Recommendation for discharge: (in order for the patient to meet his/her long term goals)  Therapy up to 5 days/week in SNF setting    This discharge recommendation:  Has been made in collaboration with the attending provider and/or case management    IF patient discharges home will need the following DME: TBD       SUBJECTIVE:   Patient stated "I dont think that will happen."  Re: out of bed today  OBJECTIVE DATA SUMMARY:   HISTORY:   Past Medical History:   Diagnosis Date    Acquired hypothyroidism     Anxiety and depression     Emphysema lung (HCC)     Emphysema lung (HCC)     Fibromyalgia     Gout     Graves disease     Graves' disease     Heart failure with preserved ejection fraction (HCC)     Hemorrhagic shock (HCC) 02/05/2021    Pulmonary hypertension (HCC)     Per records from [facility], patient has a history of severe pulmonary hypertension as admission of February, 2022.  Previously, echocardiogram of 11/05/2016 at [facility] showed mild pulmonary hypertension, normal calculated LVEF of 63%, mild tricuspid regurgitation and trace mitral regurgitation.    Type 2 diabetes mellitus (HCC)     Underweight     Upper GI bleed 02/07/2021     Past Surgical History:   Procedure Laterality Date    COLONOSCOPY N/A 02/09/2021    COLONOSCOPY performed by Geary Deitra GRADE, MD at [facility]COPY    HX CHOLECYSTECTOMY  1972    HX HYSTERECTOMY         Expanded or  extensive additional review of patient history:     Home Situation  Home Environment: Apartment  # Steps to Enter: 14  One/Two Story Residence: One story  Living Alone: No  Support Systems: Other (Comment) (niece and niece's daughter; son occasionally)  Patient Expects to be Discharged to:: Home  Current DME Used/Available at Home: Cane, straight  Tub or Shower Type: Shower    Hand dominance: Right    EXAMINATION OF PERFORMANCE DEFICITS:  Cognitive/Behavioral Status:  Neurologic State: Alert  Orientation Level: Oriented to person  Cognition: Decreased attention/concentration;Follows commands     Perseveration: No perseveration noted  Safety/Judgement: Decreased insight into deficits    Skin: intact as seen    Edema: none noted     Hearing:  Auditory  Auditory Impairment: None    Vision/Perceptual:                                     Range of Motion  AROM: Generally decreased, functional                         Strength:  Strength: Generally decreased, functional                Coordination:  Coordination: Generally decreased, functional            Tone & Sensation:  Tone: Normal  Sensation: Intact                      Balance:  Sitting: Intact  Standing: Impaired  Standing - Static: Constant support  Standing - Dynamic : Fair;Constant support    Functional Mobility and Transfers for ADLs:  Bed Mobility:  Supine to Sit: Minimum assistance;Assist x2  Scooting: Contact guard assistance    Transfers:  Sit to Stand: Minimum assistance;Assist x2  Stand to Sit: Minimum assistance;Assist x2  Bed to Chair: Minimum assistance;Assist x2  Toilet Transfer : Minimum assistance;Assist x2 (BSC)    ADL Assessment:  Feeding: Contact guard assistance    Oral Facial Hygiene/Grooming: Moderate assistance    Bathing: Maximum assistance         Upper Body Dressing: Minimum assistance    Lower Body Dressing: Maximum assistance    Toileting: Maximum assistance                  ADL Intervention and task modifications:                                           Cognitive Retraining  Safety/Judgement: Decreased insight into deficits      Therapeutic Exercise:  Functional Measure:    Barthel Index:  Bathing: 0  Bladder: 5  Bowels: 5  Grooming: 0  Dressing: 0  Feeding: 0  Mobility: 0  Stairs: 0  Toilet Use: 0  Transfer (Bed to Chair and Back): 5  Total: 15/100      The Barthel ADL Index: Guidelines  1. The index should be used as a record of what a patient does, not as a record of what a patient could do.  2. The main aim is to establish degree of independence from any help, physical or verbal, however minor and for whatever reason.  3. The need for supervision renders the patient not independent.  4. A patient's performance should be established using the best available evidence. Asking the patient, friends/relatives and nurses are the usual sources, but direct observation and common sense are also important. However direct testing is not needed.  5. Usually the patient's performance over the preceding 24-48 hours is important, but occasionally longer periods will be relevant.  6. Middle categories imply that the patient supplies over 50 per cent of the effort.  7. Use of aids to be independent is allowed.    Score Interpretation (from Sinoff 1997)   80-100 Independent   60-79 Minimally independent   40-59 Partially dependent   20-39 Very dependent   <20 Totally dependent     -Mahoney, F.l., Barthel, D.W. (1965). Functional evaluation: the Barthel Index. [place] [address] [place] (14)2.  -Sinoff, G., Ore, L. (1997). The Barthel activities of daily living index: self-reporting versus actual performance in the old (> or = 75 years). Journal of American Geriatric Society 45(7), [phone].   -Fleeta cotton Norris, J.J.M.F, Orman ROES., Oneita MERYL Sebastian DELORSE. (1999). Measuring the change in disability after inpatient rehabilitation; comparison of the responsiveness of the Barthel Index and Functional Independence Measure. Journal of Neurology, Neurosurgery, and  Psychiatry, 66(4), [phone].  Marea Chillington, N.J.A, Scholte op Wadsworth,  W.J.M, & Koopmanschap, M.A. (2004) Assessment of post-stroke quality of life in cost-effectiveness studies: The usefulness of the Barthel Index and the EuroQoL-5D. Quality of Life Research, 58, 572-56         Occupational Therapy Evaluation Charge Determination   History Examination Decision-Making   LOW Complexity : Brief history review  LOW Complexity : 1-3 performance deficits relating to physical, cognitive , or psychosocial skils that result in activity limitations and / or participation restrictions  LOW Complexity : No comorbidities that affect functional and no verbal or physical assistance needed to complete eval tasks       Based on the above components, the patient evaluation is determined to be of the following complexity level: LOW   Pain Rating:      Activity Tolerance:   requires rest breaks    After treatment patient left in no apparent distress:    Sitting in chair, Call bell within reach, and Bed / chair alarm activated    COMMUNICATION/EDUCATION:   The patient's plan of care was discussed with: Physical therapist and Registered nurse.     Home safety education was provided and the patient/caregiver indicated understanding., Patient/family have participated as able in goal setting and plan of care., and Patient/family agree to work toward stated goals and plan of care.    This patient's plan of care is appropriate for delegation to OTA.    Thank you for this referral.  Jocelyn C Skipper, OTR/L  Time Calculation: 39 mins

## 2021-08-02 NOTE — Progress Notes (Signed)
Problem: Dysphagia (Adult)  Goal: *Acute Goals and Plan of Care (Insert Text)  Description: Swallowing goals initiated 07-28-21:  1) tolerate soft and bite sized diet without s/s aspiration by 07-31-21-discontinued  2) tolerate minced and moist diet , thin liquids without s/s aspiration by 08-05-21  Outcome: Not Met     SPEECH LANGUAGE PATHOLOGY DYSPHAGIA TREATMENT  Patient: Savannah Irwin (75 y.o. female)  Date: 08/02/2021  Diagnosis: Fever [R50.9] Fever      Precautions:  Fall, Skin (droplet +)    ASSESSMENT:  Patient continues with significantly prolonged, minimal mastication and prolonged oral holding requiring verbal cues to continue chewing graham cracker. Functional swallow with liquids, and no overt s/s aspiration observed. Patient with untouched breakfast tray at bedside and reported poor appetite, suspect due to COVID-19.     PLAN:  Recommendations and Planned Interventions:  -Continue minced and moist/thin liquid diet  -Consider liquid oral nutrition supplements as patient would prefer to drink than eat  -SLP to follow for diet tolerance and liberalization as appetite improves  Patient continues to benefit from skilled intervention to address the above impairments.  Continue treatment per established plan of care.  Discharge Recommendations:  None     SUBJECTIVE:   Patient agreeable to eating graham cracker, however not agreeable to eating her breakfast.    OBJECTIVE:   Cognitive and Communication Status:  Neurologic State: Alert  Orientation Level: Oriented to person  Cognition: Decreased attention/concentration  Perception: Appears intact     Safety/Judgement: Lack of insight into deficits  Dysphagia Treatment:     P.O. Trials:  Patient Position: upright in bed  Vocal quality prior to P.O.: No impairment  Consistency Presented: Solid;Thin liquid  How Presented: SLP-fed/presented;Straw;Successive swallows     Bolus Acceptance: No impairment  Bolus Formation/Control: Impaired  Type of Impairment:  Delayed;Mastication  Propulsion: Delayed (# of seconds) (prolonged oral holding)  Oral Residue: None        Aspiration Signs/Symptoms: None (1 delayed cough suspect related to covid)            Pain:  Pain Scale 1: Numeric (0 - 10)  Pain Intensity 1: 0       After treatment:   Patient left in no apparent distress in bed, Call bell within reach, and Nursing notified    COMMUNICATION/EDUCATION:   Patient was educated regarding her deficit(s) of dysphagia as this relates to her diagnosis.  She demonstrated Fair understanding as evidenced by nodding.    The patient's plan of care including recommendations, planned interventions, and recommended diet changes were discussed with: Registered nurse.     Zoila Shutter, SLP  Time Calculation: 15 mins

## 2021-08-02 NOTE — Progress Notes (Signed)
1930 Bedside shift change report given to Ivor Messier RN (oncoming nurse) by Serita Kyle RN (offgoing nurse). Report included the following information SBAR, Intake/Output, MAR, Recent Results, and Cardiac Rhythm NSR .      0700 Bedside shift change report given to Lelon Mast RN (oncoming nurse) by Nechama Guard (offgoing nurse). Report included the following information SBAR, Intake/Output, MAR, and Recent Results.      This patient was assisted with Intentional Toileting every 2 hours during this shift as appropriate.  Documentation of ambulation and output reflected on Flowsheet as appropriate.  Purposeful hourly rounding was completed using AIDET and 5Ps.  Outcomes of PHR documented as they occurred. Bed alarm in use as appropriate.  Dual Suction and ambubag in place.

## 2021-08-02 NOTE — Progress Notes (Signed)
 Comprehensive Nutrition Assessment    Type and Reason for Visit: RD nutrition re-screen/LOS    Nutrition Recommendations/Plan:   Add ONS with meals for diabetes - Glucerna TID   Adjusted diet order to include No Concentrated Sweets with Dysphagia Minced & Moist      Malnutrition Assessment:  Malnutrition Status:  Insufficient data (08/02/21 1851)    Context:  Chronic illness       Nutrition Assessment:      8/24: RD called into patient's room, she answered after several rings, pt answered most questions with simple responses, reported usual weight of 147-148 lbs although recent wts measured much lower including admission 5 months ago. BG is elevated likely with use of steroids and not limiting sugars in diet. IVF are running. Intake is better with meal set up and assistance. SLP following. Added ONS to increase protein intake. Monitor weights & skin.     Nutrition Related Findings:      Wound Type: None  Last Bowel Movement Date: 08/02/21  Stool Appearance: Loose  Abdominal Assessment: Intact, Soft  Appetite: Poor  Bowel Sounds: Active   Edema:No data recorded    Nutr. Labs:  Lab Results   Component Value Date/Time    GFR est AA >60 08/02/2021 04:28 AM    GFR est non-AA >60 08/02/2021 04:28 AM    Creatinine 0.72 08/02/2021 04:28 AM    BUN 15 08/02/2021 04:28 AM    Sodium 134 (L) 08/02/2021 04:28 AM    Potassium 4.2 08/02/2021 04:28 AM    Chloride 102 08/02/2021 04:28 AM    CO2 24 08/02/2021 04:28 AM     Lab Results   Component Value Date/Time    Glucose 160 (H) 08/02/2021 04:28 AM    Glucose (POC) 203 (H) 08/03/2021 08:16 AM     Lab Results   Component Value Date/Time    Hemoglobin A1c 10.0 (H) 06/21/2021 11:18 AM    Hemoglobin A1c 6.3 (H) 02/05/2021 09:53 AM     Nutr. Meds:  LR @ 125 mL/hr, Lipitor, synthroid, solu-medrol, Protonix, Zoloft  PRN: correction insulin, Miralax    Current Nutrition Intake & Therapies:  Average Meal Intake: 51-75%  Average Supplement Intake: None ordered  ADULT DIET Dysphagia - Minced &  Moist  ADULT ORAL NUTRITION SUPPLEMENT Breakfast, Lunch, Dinner; Diabetic Supplement    Documented Meal intake:  Patient Vitals for the past 168 hrs:   % Diet Eaten   08/02/21 1838 76 - 100%   08/02/21 1350 26 - 50%   08/02/21 0903 26 - 50%   08/01/21 1617 0%   08/01/21 1134 1 - 25%   07/31/21 1726 1 - 25%   07/31/21 0954 1 - 25%     Documentation of supplement intake:  Patient Vitals for the past 168 hrs:   Supplement intake %   08/02/21 1838 0%   08/02/21 1350 0%   08/02/21 0903 0%   08/01/21 1617 0%   08/01/21 1134 0%   07/31/21 0954 0%       Anthropometric Measures:  Height: 5' 3 (160 cm)  Ideal Body Weight (IBW): 115 lbs (52 kg)  Admission Body Weight: 130 lb (?stated weight)  Current Body Wt:  53.7 kg (118 lb 6.2 oz), 102.9 % IBW. Bed scale  Current BMI (kg/m2): 21  Usual Body Weight: 49.6 kg (109 lb 5.6 oz) (noted 5 months ago from records)  % Weight Change (Calculated): 8.3  Weight Adjustment: No adjustment  BMI Category: Underweight (BMI less than 22) age over 38  Last 3 Recorded Weights in this Encounter    07/30/21 1158 07/31/21 0432 08/02/21 1355   Weight: 55.6 kg (122 lb 9.6 oz) 56.8 kg (125 lb 3.2 oz) 53.7 kg (118 lb 6.4 oz)     Wt Readings from Last 10 Encounters:   08/02/21 53.7 kg (118 lb 6.4 oz)   06/21/21 55.3 kg (122 lb)   03/22/21 51.8 kg (114 lb 3.2 oz)   02/09/21 52.8 kg (116 lb 6.4 oz)       Estimated Daily Nutrient Needs:  Energy Requirements Based On: Kcal/kg  Weight Used for Energy Requirements: Current  Energy (kcal/day): 1340 - 1610 kcal/d (25-30 kcal/kg)  Weight Used for Protein Requirements: Current  Protein (g/day): 50-64 g (1-1.2 g/kg)  Method Used for Fluid Requirements: ml/kg  Fluid (ml/day): 1340 mL/d (25 ml/kg)    Nutrition Diagnosis:   Altered nutrition-related lab values related to endocrine dysfunction as evidenced by BMI, lab values, a1c% is now 10%  Biting/chewing (masticatory) difficulty, Swallowing difficulty related to catabolic illness as evidenced by  swallowing study results, poor intake prior to admission    Nutrition Interventions:   Food and/or Nutrient Delivery: Continue current diet, Start oral nutrition supplement  Nutrition Education/Counseling: No recommendations at this time  Coordination of Nutrition Care: Continue to monitor while inpatient, Feeding assistance/environmental change  Plan of Care discussed with: RN    Goals:     Goals: PO intake 75% or greater, by next RD assessment       Nutrition Monitoring and Evaluation:   Behavioral-Environmental Outcomes: None identified  Food/Nutrient Intake Outcomes: Food and nutrient intake, Supplement intake  Physical Signs/Symptoms Outcomes: Weight, Nutrition focused physical findings, Meal time behavior    Discharge Planning:    Continue oral nutrition supplement    Delphine CHRISTELLA Dy, RD, MS  Contact via [employer] or office [phone]

## 2021-08-02 NOTE — Progress Notes (Signed)
Care Management follow up, LOS 6 days     Patient admitted for  found unresponsive at home. Acute encephalopathy, SIRS, sepsis, acute renal failure.  Hx - hypothyroidism, anxiety, depression, COPD, diabetes, pulmonary HTN, CVA with left sided weakness, seizures.     COVID Vaccine:  yes, vaccine x 2, no booster     RUR 13 (Score %) low             Is This a Readmission NO  Is this a Bundle NO     Current status  Patient discussed during interdisciplinary rounds.  Patient continues to require medical management including ongoing assessment and monitoring.  Continues on IV antibiotics. Rapid response yesterday. Await current PT/OT evaluations to assist with planning.     Transition of Care Plan  Monitor patient status and response to treatment.  Patient continues to require medical management.  CM needs: unsure at this time.  [facility] home health in the past.  Await current PT/OT evaluations.  CM to monitor progress and recommendations.    Midge Aver, RN, MSN/Care manager  [phone]

## 2021-08-02 NOTE — Progress Notes (Signed)
[facility]  [place], [place] [address]  [phone]      Medical Progress Note      NAME: Savannah Irwin   DOB:  1946-11-11  MRM:  [id number]    Date/Time: 08/02/2021           Assessment / Plan:     75 y.o. female with past medical history of hypothyroidism, anxiety, depression, emphesema, who presented on 07/27/2021 with unresponsiveness.    #Unresponsiveness: Likely a syncopal episode. EEG neg for seizures. MRI w/o acute cva. Sx resolved, however, she remains profoundly weak.     #Sepsis: Due to UTI vs covid. Had started ceftriaxone, but with worsening mental status and fever 8/23, did broaden abx   - Cefepime   - Fu Ucx, Bcx    #Polyarthralgia: Pain with movement of most extremities. 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 ESR and CRP quite elevated which again may be due to covid, but will eval for inflammatory arthritis and start steroids. She says she has a hx of RA but cannot remember what meds she was on. She ran out about a month ago    - RF, CCP   - Start methylpred 41m q6 IV with plan to transition to pred taper, then will need DMARD and Rheum f/u    #COVID Infection: Weakness, malaise, polyarthralgia, and intermittent hypoxemia noted. Inflammatory markers quite elevated so did elect to start steroids    #AKI: 2/2 volume depletion. Resolved    #Hypovolemic Hyponatremia: Improved with IVF    #DM2: A1c was 10% last month, but has required minimal insulin here   - Start metformin 5038mbid, SSI; may need more while on steroids    #Anxiety/Depression: Cont home meds    #Emphysema: No exacerbation; continues to smoke    Care Plan discussed with: Patient, Family, Care Manager, Nursing Staff, and Consultant/Specialist    Discussed:  Care Plan    Prophylaxis:  Lovenox    Disposition:  SNF/LTC           ___________________________________________________    Attending Physician: AnLyn HollingsheadMD        Subjective:     Chief Complaint:  No acute events  overnight. She feels a little bit better today, able to move extremities a little more, but sill "hurting everywhere." Denies cough, sob    ROS:  (bold if positive, if negative)           Objective:       Vitals:          Last 24hrs VS reviewed since prior progress note. Most recent are:    Visit Vitals  BP (!) 147/88 (BP 1 Location: Right upper arm, BP Patient Position: At rest)   Pulse (!) 103   Temp 98.8 ??F (37.1 ??C)   Resp 20   Ht _0  (1.6 m)   Wt 56.8 kg (125 lb 3.2 oz)   SpO2 100%   BMI 22.18 kg/m??     SpO2 Readings from Last 6 Encounters:   08/01/21 100%   06/21/21 95%   03/22/21 95%   02/10/21 94%    O2 Flow Rate (L/min): 2 l/min     Intake/Output Summary (Last 24 hours) at 08/02/2021 0750  Last data filed at 08/01/2021 1939  Gross per 24 hour   Intake 600 ml   Output 1400 ml   Net -800 ml  Exam:     Physical Exam:    Gen:  Frail, nad  HEENT:  Pink conjunctivae, PERRL, hearing intact to voice, moist mucous membranes  Neck:  Supple, without masses, thyroid non-tender  Resp:  No accessory muscle use, clear breath sounds without wheezes rales or rhonchi  Card:  No murmurs, normal S1, S2 without thrills, bruits or peripheral edema  Abd:  Soft, non-tender, non-distended, normoactive bowel sounds are present  Musc:  No cyanosis or clubbing, mild warmth to bilat knees, bilat MCPs w/ warmth and synovitis  Skin:  No rashes or ulcers, skin turgor is good  Neuro:  Cranial nerves 3-12 are grossly intact, grip strength is 5/5 bilaterally and dorsi / plantarflexion is 5/5 bilaterally, follows commands appropriately  Psych:  Good insight, oriented to person, place and time, alert       Medications Reviewed: (see below)    Lab Data Reviewed: (see below)    ______________________________________________________________________    Medications:     Current Facility-Administered Medications   Medication Dose Route Frequency    lactated Ringers infusion  125 mL/hr IntraVENous CONTINUOUS    cefepime (MAXIPIME) 2 g in  0.9% sodium chloride (MBP/ADV) 100 mL MBP  2 g IntraVENous Q24H    dexAMETHasone (DECADRON) tablet 6 mg  6 mg Oral DAILY    metFORMIN (GLUCOPHAGE) tablet 500 mg  500 mg Oral BID WITH MEALS    dextrose 10% infusion 0-250 mL  0-250 mL IntraVENous PRN    insulin lispro (HUMALOG) injection   SubCUTAneous AC&HS    enoxaparin (LOVENOX) injection 40 mg  40 mg SubCUTAneous DAILY    sodium chloride (NS) flush 5-10 mL  5-10 mL IntraVENous PRN    levothyroxine (SYNTHROID) tablet 50 mcg  50 mcg Oral ACB    metoprolol tartrate (LOPRESSOR) tablet 25 mg  25 mg Oral BID    pantoprazole (PROTONIX) tablet 40 mg  40 mg Oral DAILY    sertraline (ZOLOFT) tablet 50 mg  50 mg Oral DAILY    topiramate (TOPAMAX) tablet 25 mg  25 mg Oral BID WITH MEALS    brivaracetam (BRIVIACT) tablet 50 mg  50 mg Oral BID    atorvastatin (LIPITOR) tablet 10 mg  10 mg Oral DAILY    sodium chloride (NS) flush 5-40 mL  5-40 mL IntraVENous Q8H    sodium chloride (NS) flush 5-40 mL  5-40 mL IntraVENous PRN    acetaminophen (TYLENOL) tablet 650 mg  650 mg Oral Q6H PRN    Or    acetaminophen (TYLENOL) suppository 650 mg  650 mg Rectal Q6H PRN    polyethylene glycol (MIRALAX) packet 17 g  17 g Oral DAILY PRN    ondansetron (ZOFRAN ODT) tablet 4 mg  4 mg Oral Q8H PRN    Or    ondansetron (ZOFRAN) injection 4 mg  4 mg IntraVENous Q6H PRN    albuterol-ipratropium (DUO-NEB) 2.5 MG-0.5 MG/3 ML  3 mL Nebulization Q4H PRN    glucose chewable tablet 16 g  4 Tablet Oral PRN    glucagon (GLUCAGEN) injection 1 mg  1 mg IntraMUSCular PRN            Lab Review:     Recent Labs     08/02/21  0428 08/01/21  1101 08/01/21  0820   WBC 5.0 6.3 5.8   HGB 11.4* 11.3* 10.8*   HCT 35.7 35.5 33.7*   PLT 241 205 215     Recent Labs     08/02/21  1610 08/01/21  1101 08/01/21  0820   NA 134* 129* 132*   K 4.2 3.8 3.8   CL 102 99 98   CO2 24 20* 23   GLU 160* 145* 165*   BUN _0 CREA 0.72 0.79 0.83   CA 8.7 8.7 8.2*   ALB 2.1* 2.3* 2.2*   ALT 10* 12 11*       No components found for:  [facility]

## 2021-08-03 LAB — GLUCOSE, POC
Glucose (POC): 240 mg/dL — ABNORMAL HIGH (ref 65–117)
Glucose (POC): 203 mg/dL — ABNORMAL HIGH (ref 65–117)
Glucose (POC): 218 mg/dL — ABNORMAL HIGH (ref 65–117)
Glucose (POC): 243 mg/dL — ABNORMAL HIGH (ref 65–117)

## 2021-08-03 LAB — CULTURE, URINE
Colonies Counted: >100000
Colony Count: >100000

## 2021-08-03 LAB — POCT GLUCOSE
POC Glucose: 240 mg/dL — ABNORMAL HIGH (ref 65–117)
POC Glucose: 203 mg/dL — ABNORMAL HIGH (ref 65–117)
POC Glucose: 218 mg/dL — ABNORMAL HIGH (ref 65–117)
POC Glucose: 243 mg/dL — ABNORMAL HIGH (ref 65–117)

## 2021-08-03 MED ORDER — METFORMIN 500 MG TAB
500 mg | Freq: Two times a day (BID) | ORAL | Status: DC
Start: 2021-08-03 — End: 2021-08-04
  Administered 2021-08-03 – 2021-08-04 (×2): via ORAL

## 2021-08-03 MED FILL — INSULIN LISPRO 100 UNIT/ML INJECTION: 100 unit/mL | SUBCUTANEOUS | Qty: 3

## 2021-08-03 MED FILL — METOPROLOL TARTRATE 25 MG TAB: 25 mg | ORAL | Qty: 1

## 2021-08-03 MED FILL — TOPIRAMATE 25 MG TAB: 25 mg | ORAL | Qty: 1

## 2021-08-03 MED FILL — SOLU-MEDROL (PF) 40 MG/ML SOLUTION FOR INJECTION: 40 mg/mL | INTRAMUSCULAR | Qty: 1

## 2021-08-03 MED FILL — ATORVASTATIN 10 MG TAB: 10 mg | ORAL | Qty: 1

## 2021-08-03 MED FILL — LEVOTHYROXINE 50 MCG TAB: 50 mcg | ORAL | Qty: 1

## 2021-08-03 MED FILL — CEFEPIME 2 GRAM SOLUTION FOR INJECTION: 2 gram | INTRAMUSCULAR | Qty: 2

## 2021-08-03 MED FILL — PANTOPRAZOLE 40 MG TAB, DELAYED RELEASE: 40 mg | ORAL | Qty: 1

## 2021-08-03 MED FILL — METFORMIN 500 MG TAB: 500 mg | ORAL | Qty: 1

## 2021-08-03 MED FILL — INSULIN LISPRO 100 UNIT/ML INJECTION: 100 unit/mL | SUBCUTANEOUS | Qty: 2

## 2021-08-03 MED FILL — BRIVIACT 25 MG TABLET: 25 mg | ORAL | Qty: 2

## 2021-08-03 MED FILL — METFORMIN 500 MG TAB: 500 mg | ORAL | Qty: 2

## 2021-08-03 MED FILL — ENOXAPARIN 40 MG/0.4 ML SUB-Q SYRINGE: 40 mg/0.4 mL | SUBCUTANEOUS | Qty: 0.4

## 2021-08-03 MED FILL — SERTRALINE 50 MG TAB: 50 mg | ORAL | Qty: 1

## 2021-08-03 NOTE — Progress Notes (Signed)
1900: Bedside shift change report given to Lauren (oncoming nurse) by Doreatha Martin (offgoing nurse). Report included the following information SBAR, Kardex, Intake/Output, MAR, and Recent Results.

## 2021-08-03 NOTE — Progress Notes (Signed)
1900  Bedside and Verbal shift change report given to Leotis Shames, RN (oncoming nurse) by Doreatha Martin, RN (offgoing nurse). Report included the following information SBAR, Kardex, Intake/Output, MAR, and Recent Results.      This patient was assisted with Intentional Toileting every 2 hours during this shift as appropriate.  Documentation of ambulation and output reflected on Flowsheet as appropriate.  Purposeful hourly rounding was completed using AIDET and 5Ps.  Outcomes of PHR documented as they occurred. Bed alarm in use as appropriate.  Dual Suction and ambubag in place.     0700  Bedside and Verbal shift change report given to Helmut Muster, Charity fundraiser (oncoming nurse) by Leotis Shames, RN (offgoing nurse). Report included the following information SBAR, Kardex, Intake/Output, MAR, and Recent Results.

## 2021-08-03 NOTE — Progress Notes (Signed)
Problem: Mobility Impaired (Adult and Pediatric)  Goal: *Acute Goals and Plan of Care (Insert Text)  Description: FUNCTIONAL STATUS PRIOR TO ADMISSION: Pt states independent baseline ambulation within the home and mod I gait using cane in community. Per care manager, niece reports pt requires 24-hour supervision/assist for safety but not necessarily physical assistance.    HOME SUPPORT PRIOR TO ADMISSION: The patient lived with niece and niece's daughter. (Confirmed by care manager.) She reports son also provides intermittent assistance.    Physical Therapy Goals  Goals upgraded 08/03/2021  1.  Patient will move from supine to sit and sit to supine  in bed with mod I within 7 day(s).    2.  Patient will transfer from bed to chair and chair to bed with supervision using the least restrictive device within 7 day(s).  3.  Patient will perform sit to stand with supervision within 7 day(s).  4.  Patient will ambulate with supervision for 150 feet with the least restrictive device within 7 day(s).   5.  Patient will ambulate up/down 17 stairs with 1 handrail and minimal assistance within 7 days.      Initiated 07/28/2021  1.  Patient will move from supine to sit and sit to supine  in bed with moderate assistance  within 7 day(s).    2.  Patient will transfer from bed to chair and chair to bed with moderate assistance  using the least restrictive device within 7 day(s).  3.  Patient will perform sit to stand with moderate assistance  within 7 day(s).  4.  Patient will ambulate with moderate assistance  for 15 feet with the least restrictive device within 7 day(s).       08/03/2021 1411 by Margrett Rud, PT, DPT  Outcome: Progressing Towards Goal   PHYSICAL THERAPY TREATMENT: WEEKLY REASSESSMENT  Patient: Savannah Irwin (75 y.o. female)  Date: 08/03/2021  Primary Diagnosis: Fever [R50.9]       Precautions:   Fall, Skin (droplet +)      ASSESSMENT  Patient continues with skilled PT services and is progressing towards  goals. She requires decreased assistance for transfers and tolerates increased ambulation distance today. Gait training initiated using RW. Pt requires cues for proper assistive device use but benefits from RW for improved gait stability and activity tolerance. Pt again with decreased SpO2 with activity using room air today. SpO2 88% with ambulation using room air and rebounds to 94% with seated rest. Since initial PT evaluation pt has demonstrated good progress toward goals. She remains with decreased strength and AROM, impaired balance, decreased endurance, and limited functional mobility.      Current Level of Function Impacting Discharge (mobility/balance): min A    Functional Outcome Measure:  The patient scored 30 on the Barthel outcome measure which is indicative of very dependent status.      Other factors to consider for discharge: none additional         PLAN :  Goals have been updated based on progression since last assessment.  Patient continues to benefit from skilled intervention to address the above impairments.    Recommendations and Planned Interventions: bed mobility training, transfer training, gait training, therapeutic exercises, neuromuscular re-education, edema management/control, patient and family training/education, and therapeutic activities      Frequency/Duration: Patient will be followed by physical therapy:  5 times a week to address goals.    Recommendation for discharge: (in order for the patient to meet his/her long term goals)  Therapy  up to 5 days/week in SNF setting    This discharge recommendation:  Has not yet been discussed the attending provider and/or case management    IF patient discharges home will need the following DME: to be determined (TBD)         SUBJECTIVE:   Patient stated "Keep my phone here so I can answer it," pt very attentive to phone in room. RN reports pt mobilized to/from bedside commode earlier today with assistance.    Pt received supine, agreeable to PT  and cleared by RN.      OBJECTIVE DATA SUMMARY:   HISTORY:    Past Medical History:   Diagnosis Date    Acquired hypothyroidism     Anxiety and depression     Emphysema lung (HCC)     Emphysema lung (HCC)     Fibromyalgia     Gout     Graves disease     Graves' disease     Heart failure with preserved ejection fraction (HCC)     Hemorrhagic shock (HCC) 02/05/2021    Pulmonary hypertension [facility])     Per records from [facility], patient has a history of severe pulmonary hypertension as admission of February, 2022.  Previously, echocardiogram of 11/05/2016 at [facility] showed mild pulmonary hypertension, normal calculated LVEF of 63%, mild tricuspid regurgitation and trace mitral regurgitation.    Type 2 diabetes mellitus (HCC)     Underweight     Upper GI bleed 02/07/2021     Past Surgical History:   Procedure Laterality Date    COLONOSCOPY N/A 02/09/2021    COLONOSCOPY performed by Arman Filter, MD at [facility] ENDOSCOPY    HX CHOLECYSTECTOMY  1972    HX HYSTERECTOMY         Personal factors and/or comorbidities impacting plan of care: as above    Home Situation  Home Environment: Apartment  # Steps to Enter: 14  One/Two Story Residence: One story  Living Alone: No  Support Systems: Other (Comment) (niece and niece's daughter; son occasionally)  Patient Expects to be Discharged to:: Home  Current DME Used/Available at Home: Cane, straight  Tub or Shower Type: Shower    EXAMINATION/PRESENTATION/DECISION MAKING:   Critical Behavior:  Neurologic State: Alert  Orientation Level: Oriented X4  Cognition: Follows commands  Safety/Judgement: Decreased insight into deficits  Hearing:  Auditory  Auditory Impairment: None  Skin:  LE exposed skin intact  Edema: none noted LEs  Range Of Motion:  AROM: Generally decreased, functional                       Strength:    Strength: Generally decreased, functional                    Tone & Sensation:   Tone: Normal              Sensation: Intact                Coordination:  Coordination: Generally decreased, functional       Functional Mobility:  Bed Mobility:     Supine to Sit: Minimum assistance;Additional time     Scooting: Contact guard assistance;Additional time  Transfers:  Sit to Stand: Minimum assistance (cues for techniques 4/4 trials)  Stand to Sit: Minimum assistance                       Balance:  Sitting: Without support  Sitting - Static: Good (unsupported)  Sitting - Dynamic: Good (unsupported)  Standing: With support  Standing - Static: Fair  Standing - Dynamic : Fair  Ambulation/Gait Training:  Distance (ft): 35 Feet (ft)  Assistive Device: Walker, rolling;Gait belt  Ambulation - Level of Assistance: Minimal assistance        Gait Abnormalities: Decreased step clearance;Step to gait        Base of Support: Narrowed     Speed/Cadence: Pace decreased (<100 feet/min)  Step Length: Right shortened;Left shortened                  Assist for RW management, cues for attention to environment.                Therapeutic Exercises:   Ankle pumps x 10    Functional Measure:  Barthel Index:    Bathing: 0  Bladder: 5  Bowels: 5  Grooming: 0  Dressing: 0  Feeding: 5  Mobility: 0  Stairs: 0  Toilet Use: 5  Transfer (Bed to Chair and Back): 10  Total: 30/100       The Barthel ADL Index: Guidelines  1. The index should be used as a record of what a patient does, not as a record of what a patient could do.  2. The main aim is to establish degree of independence from any help, physical or verbal, however minor and for whatever reason.  3. The need for supervision renders the patient not independent.  4. A patient's performance should be established using the best available evidence. Asking the patient, friends/relatives and nurses are the usual sources, but direct observation and common sense are also important. However direct testing is not needed.  5. Usually the patient's performance over the preceding 24-48 hours is important, but occasionally longer periods will be  relevant.  6. Middle categories imply that the patient supplies over 50 per cent of the effort.  7. Use of aids to be independent is allowed.    Score Interpretation (from Sinoff 1997)   80-100 Independent   60-79 Minimally independent   40-59 Partially dependent   20-39 Very dependent   <20 Totally dependent     -Mahoney, F.l., Barthel, D.W. (1965). Functional evaluation: the Barthel Index. [place] [address] (14)2.  -Sinoff, G., Ore, L. (1997). The Barthel activities of daily living index: self-reporting versus actual performance in the old (> or = 75 years). Journal of American Geriatric Society 45(7), [phone].   -Kenn File Friend, J.J.M.F, Noel Christmas., Imagene Gurney. (1999). Measuring the change in disability after inpatient rehabilitation; comparison of the responsiveness of the Barthel Index and Functional Independence Measure. Journal of Neurology, Neurosurgery, and Psychiatry, 66(4), [phone].  Dawson Bills, N.J.A, [place],  W.J.M, & Koopmanschap, M.A. (2004) Assessment of post-stroke quality of life in cost-effectiveness studies: The usefulness of the Barthel Index and the EuroQoL-5D. Quality of Life Research, 13, 427-43           Pain Rating:  Denies pain    Activity Tolerance:   requires rest breaks    After treatment patient left in no apparent distress:   Sitting in chair, Call bell within reach, and Bed / chair alarm activated    COMMUNICATION/EDUCATION:   The patient's plan of care was discussed with: Registered nurse. Dr. Kaleen Odea notified via perfect serve regarding SpO2 desaturations with activity.     Fall prevention education was provided and the patient/caregiver indicated understanding., Patient/family  have participated as able in goal setting and plan of care., and Patient/family agree to work toward stated goals and plan of care.    Thank you for this referral.  Margrett Rud, PT, DPT   Time Calculation: 40 mins

## 2021-08-03 NOTE — Progress Notes (Signed)
[facility]  [address], [place] [address]  [phone]      Medical Progress Note      NAME: Savannah Irwin   DOB:  1946/02/15  MRM:  [id number]    Date/Time: 08/03/2021           Assessment / Plan:     75 y.o. female with past medical history of hypothyroidism, anxiety, depression, emphesema, who presented on 07/27/2021 with unresponsiveness.    #Unresponsiveness: Likely a syncopal episode. EEG neg for seizures. MRI w/o acute cva. Sx resolved, however, she remains profoundly weak, but improving. Still does not want to go to SNF    #Sepsis: Due to UTI vs covid. Had started ceftriaxone, but with worsening mental status and fever 8/23, did broaden abx   - Cefepime   - Fu Ucx (GNR), Bcx (NGTD)    #Polyarthralgia: Pain with movement of most extremities. 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 ESR and CRP quite elevated which again may be due to covid, but will eval for inflammatory arthritis and start steroids. She says she has a hx of RA but cannot remember what meds she was on. She ran out about a month ago    - RF, CCP   - Started methylpred 61m q6 IV with plan to transition to pred taper, then will need DMARD and Rheum follow up    - Improved today    #COVID Infection: Weakness, malaise, polyarthralgia, and intermittent hypoxemia noted. Inflammatory markers quite elevated so did elect to start steroids    #AKI: 2/2 volume depletion. Resolved    #Hypovolemic Hyponatremia: Improved with IVF    #DM2: A1c was 10% last month, but has required minimal insulin here   - Start metformin 5047mbid, increase to 10001moday    #Anxiety/Depression: Cont home meds    #Emphysema: No exacerbation; continues to smoke    Care Plan discussed with: Patient, Family, Care Manager, Nursing Staff, and Consultant/Specialist    Discussed:  Care Plan    Prophylaxis:  Lovenox    Disposition:  SNF/LTC           ___________________________________________________    Attending  Physician: AndLyn HollingsheadD        Subjective:     Chief Complaint:  No acute events overnight. She feels much better today, brighter, able to move extremities more. More alert. Says she very much does not want to go to SNF    ROS:  (bold if positive, if negative)           Objective:       Vitals:          Last 24hrs VS reviewed since prior progress note. Most recent are:    Visit Vitals  BP 139/74 (BP 1 Location: Right lower arm, BP Patient Position: At rest)   Pulse 81   Temp 98.5 ??F (36.9 ??C)   Resp 21   Ht '5\' 3"'$  (1.6 m)   Wt 53.7 kg (118 lb 6.4 oz)   SpO2 95%   BMI 20.97 kg/m??     SpO2 Readings from Last 6 Encounters:   08/03/21 95%   06/21/21 95%   03/22/21 95%   02/10/21 94%    O2 Flow Rate (L/min): 2 l/min     Intake/Output Summary (Last 24 hours) at 08/03/2021 0720  Last data filed at 08/02/2021 1838  Gross per 24 hour   Intake 600 ml   Output 950 ml  Net -350 ml            Exam:     Physical Exam:    Gen:  Frail, nad  HEENT:  Pink conjunctivae, PERRL, hearing intact to voice, moist mucous membranes  Neck:  Supple, without masses, thyroid non-tender  Resp:  No accessory muscle use, clear breath sounds without wheezes rales or rhonchi  Card:  No murmurs, normal S1, S2 without thrills, bruits or peripheral edema  Abd:  Soft, non-tender, non-distended, normoactive bowel sounds are present  Musc:  No cyanosis or clubbing, mild warmth to bilat knees, bilat MCPs w/ warmth and synovitis, ROM improved  Skin:  No rashes or ulcers, skin turgor is good  Neuro:  Cranial nerves 3-12 are grossly intact, grip strength is 5/5 bilaterally and dorsi / plantarflexion is 5/5 bilaterally, follows commands appropriately  Psych:  Good insight, oriented to person, place and time, alert       Medications Reviewed: (see below)    Lab Data Reviewed: (see below)    ______________________________________________________________________    Medications:     Current Facility-Administered Medications   Medication Dose Route Frequency     methylPREDNISolone (PF) (SOLU-MEDROL) injection 40 mg  40 mg IntraVENous Q6H    lactated Ringers infusion  125 mL/hr IntraVENous CONTINUOUS    cefepime (MAXIPIME) 2 g in 0.9% sodium chloride (MBP/ADV) 100 mL MBP  2 g IntraVENous Q24H    metFORMIN (GLUCOPHAGE) tablet 500 mg  500 mg Oral BID WITH MEALS    dextrose 10% infusion 0-250 mL  0-250 mL IntraVENous PRN    insulin lispro (HUMALOG) injection   SubCUTAneous AC&HS    enoxaparin (LOVENOX) injection 40 mg  40 mg SubCUTAneous DAILY    sodium chloride (NS) flush 5-10 mL  5-10 mL IntraVENous PRN    levothyroxine (SYNTHROID) tablet 50 mcg  50 mcg Oral ACB    metoprolol tartrate (LOPRESSOR) tablet 25 mg  25 mg Oral BID    pantoprazole (PROTONIX) tablet 40 mg  40 mg Oral DAILY    sertraline (ZOLOFT) tablet 50 mg  50 mg Oral DAILY    topiramate (TOPAMAX) tablet 25 mg  25 mg Oral BID WITH MEALS    brivaracetam (BRIVIACT) tablet 50 mg  50 mg Oral BID    atorvastatin (LIPITOR) tablet 10 mg  10 mg Oral DAILY    sodium chloride (NS) flush 5-40 mL  5-40 mL IntraVENous Q8H    sodium chloride (NS) flush 5-40 mL  5-40 mL IntraVENous PRN    acetaminophen (TYLENOL) tablet 650 mg  650 mg Oral Q6H PRN    Or    acetaminophen (TYLENOL) suppository 650 mg  650 mg Rectal Q6H PRN    polyethylene glycol (MIRALAX) packet 17 g  17 g Oral DAILY PRN    ondansetron (ZOFRAN ODT) tablet 4 mg  4 mg Oral Q8H PRN    Or    ondansetron (ZOFRAN) injection 4 mg  4 mg IntraVENous Q6H PRN    albuterol-ipratropium (DUO-NEB) 2.5 MG-0.5 MG/3 ML  3 mL Nebulization Q4H PRN    glucose chewable tablet 16 g  4 Tablet Oral PRN    glucagon (GLUCAGEN) injection 1 mg  1 mg IntraMUSCular PRN            Lab Review:     Recent Labs     08/02/21  0428 08/01/21  1101 08/01/21  0820   WBC 5.0 6.3 5.8   HGB 11.4* 11.3* 10.8*   HCT 35.7 35.5 33.7*  PLT 241 205 215       Recent Labs     08/02/21  0428 08/01/21  1101 08/01/21  0820   NA 134* 129* 132*   K 4.2 3.8 3.8   CL 102 99 98   CO2 24 20* 23   GLU 160* 145* 165*   BUN _0 CREA 0.72 0.79 0.83   CA 8.7 8.7 8.2*   ALB 2.1* 2.3* 2.2*   ALT 10* 12 11*       No components found for: [facility]

## 2021-08-03 NOTE — Progress Notes (Signed)
 3:35 pm:  Patient worked with PT.  CM called into patient's room to discuss d/c recommendation for SNF.  Patient deferred SNF referrals at this time.  She wants to discuss further with her niece; she will give you a call.    12:07 pm: Niece stated that patient does not prefer SNF.  Niece is concerned about the number of steps (17) to get into her apartment if patient discharges home.    11:42 am:  CM met with patient's niece, Bobbette Sharps, to provide a SNF list and information on companies that help families with finding Assisted Living.  Niece will follow-up with CM after reviewing SNF list with patient.      Transition of Care Plan - RUR 13%:  CM following - COVID +, lives with niece in her apartment with 17 steps  PT/OT following - recommending SNF; patient has had home health in the past with [facility]  SNF list provided to patient's niece  Dispo:  TBD SNF vs HH    Ike Dollar, LCSW

## 2021-08-04 LAB — GLUCOSE, POC
Glucose (POC): 161 mg/dL — ABNORMAL HIGH (ref 65–117)
Glucose (POC): 180 mg/dL — ABNORMAL HIGH (ref 65–117)
Glucose (POC): 199 mg/dL — ABNORMAL HIGH (ref 65–117)

## 2021-08-04 LAB — POCT GLUCOSE
POC Glucose: 161 mg/dL — ABNORMAL HIGH (ref 65–117)
POC Glucose: 180 mg/dL — ABNORMAL HIGH (ref 65–117)
POC Glucose: 199 mg/dL — ABNORMAL HIGH (ref 65–117)

## 2021-08-04 MED ORDER — CEFTRIAXONE 1 GRAM SOLUTION FOR INJECTION
1 gram | INTRAMUSCULAR | Status: DC
Start: 2021-08-04 — End: 2021-08-04
  Administered 2021-08-04: 13:00:00 via INTRAVENOUS

## 2021-08-04 MED ORDER — PREDNISONE 10 MG TAB
10 mg | ORAL_TABLET | ORAL | 0 refills | Status: AC
Start: 2021-08-04 — End: 2021-08-24

## 2021-08-04 MED ORDER — CEFDINIR 300 MG CAP
300 mg | ORAL_CAPSULE | Freq: Two times a day (BID) | ORAL | 0 refills | Status: AC
Start: 2021-08-04 — End: 2021-08-07

## 2021-08-04 MED ORDER — METFORMIN 1,000 MG TAB
1000 mg | ORAL_TABLET | Freq: Two times a day (BID) | ORAL | 0 refills | Status: AC
Start: 2021-08-04 — End: 2021-09-03

## 2021-08-04 MED FILL — METFORMIN 500 MG TAB: 500 mg | ORAL | Qty: 2

## 2021-08-04 MED FILL — ATORVASTATIN 10 MG TAB: 10 mg | ORAL | Qty: 1

## 2021-08-04 MED FILL — INSULIN LISPRO 100 UNIT/ML INJECTION: 100 unit/mL | SUBCUTANEOUS | Qty: 2

## 2021-08-04 MED FILL — SERTRALINE 50 MG TAB: 50 mg | ORAL | Qty: 1

## 2021-08-04 MED FILL — PANTOPRAZOLE 40 MG TAB, DELAYED RELEASE: 40 mg | ORAL | Qty: 1

## 2021-08-04 MED FILL — SOLU-MEDROL (PF) 40 MG/ML SOLUTION FOR INJECTION: 40 mg/mL | INTRAMUSCULAR | Qty: 1

## 2021-08-04 MED FILL — TOPIRAMATE 25 MG TAB: 25 mg | ORAL | Qty: 1

## 2021-08-04 MED FILL — BRIVIACT 25 MG TABLET: 25 mg | ORAL | Qty: 2

## 2021-08-04 MED FILL — ACETAMINOPHEN 325 MG TABLET: 325 mg | ORAL | Qty: 2

## 2021-08-04 MED FILL — ENOXAPARIN 40 MG/0.4 ML SUB-Q SYRINGE: 40 mg/0.4 mL | SUBCUTANEOUS | Qty: 0.4

## 2021-08-04 MED FILL — LEVOTHYROXINE 50 MCG TAB: 50 mcg | ORAL | Qty: 1

## 2021-08-04 MED FILL — METOPROLOL TARTRATE 25 MG TAB: 25 mg | ORAL | Qty: 1

## 2021-08-04 MED FILL — CEFTRIAXONE 1 GRAM SOLUTION FOR INJECTION: 1 gram | INTRAMUSCULAR | Qty: 1

## 2021-08-04 NOTE — Progress Notes (Signed)
0730- Bedside and Verbal shift change report given to Helmut Muster, Charity fundraiser (oncoming nurse) by Leotis Shames, RN (offgoing nurse). Report included the following information SBAR, Kardex, ED Summary, OR Summary, Procedure Summary, Intake/Output, MAR, Accordion, and Recent Results.       This patient was assisted with Intentional Toileting every 2 hours during this shift.  Documentation of ambulation and output reflected on Flowsheet.     I have reviewed discharge instructions with the patient.  The patient verbalized understanding.     Patient belonging from security given to patient.

## 2021-08-04 NOTE — Progress Notes (Signed)
Order received for home health nursing, PT. Home health follow up discussed with patient's niece [name].  Home health providers discussed, patient  has been followed by [facility] home health in the past and is preferred again.Choice is [facility]  home health, referral sent via [facility].  Await response.    ACCEPTED by [facility] home health AVS updated.    [facility] referrals placed requesting 2pm stretcher pick up, accepted by pick up at 2pm.  [facility] forms attached to [facility] referral, forms placed on front of chart.    Care Management Interventions  PCP Verified by CM: Yes (Dr. Joyice Faster)  Mode of Transport at Discharge: Other (see comment) (per niece)  Transition of Care Consult (CM Consult): Home Health  [facility]: No  Reason Outside [facility] Agency Chosen:  (Patient choice)  Physical Therapy Consult: Yes  Speech Therapy Consult: Yes  Support Systems: Other (Comment) (niece and niece's daughter; son occasionally)  Confirm Follow Up Transport: Family  The Plan for Transition of Care is Related to the Following Treatment Goals : Return home at DC vs placement  The Patient and/or Patient Representative was Provided with a Choice of Provider and Agrees with the Discharge Plan?: (S) Yes  Name of the Patient Representative Who was Provided with a Choice of Provider and Agrees with the Discharge Plan: [name], niece  Freedom of Choice List was Provided with Basic Dialogue that Supports the Patient's Individualized Plan of Care/Goals, Treatment Preferences and Shares the Quality Data Associated with the Providers?: (S) Yes  Discharge Location  Patient Expects to be Discharged to:: Home with home health    Midge Aver, RN, MSN/Care manager

## 2021-08-04 NOTE — Discharge Summary (Signed)
Physician Discharge Summary     Patient ID:  Savannah Irwin  [id number]  75 y.o.  11-13-1946    Admit date: 07/27/2021    Discharge date and time: 08/04/2021    Admission Diagnoses: Fever [R50.9]    Discharge Diagnoses:    Principal Problem:    Fever (07/27/2021)    Active Problems:    Type 2 diabetes mellitus ([place]) ()      Overview: Not currently taking medications.  Diet controlled.      Heart failure with preserved ejection fraction (HCC) ()      Emphysema lung (HCC) ()      Anxiety and depression ()      Mixed hyperlipidemia (03/22/2021)      Encephalopathy acute (06/21/2021)      Hypothyroidism (06/21/2021)      Essential hypertension (06/21/2021)      Tachycardia (07/27/2021)      ARF (acute renal failure) ([place]) (07/27/2021)      Dehydration (07/27/2021)      Unresponsiveness (07/27/2021)           Hospital Course:   Ms. Nobel is a 75 y.o.  African American female who is admitted with acute encephalopathy.  Ms. Richart's past medical history of hypothyroidism, anxiety, depression, emphysema, hypothyroidism, DM, seizure was brought to ER patient was found on the ground unresponsive.  Patient unable to provide history and try to contact family without response.  According to the reports, patient has a history of left-sided deficits with stroke but is generally alert, oriented, and ambulatory.  In ER, patient found to have a fever and acute renal failure. She was admitted for further evaluation and treatment of the following:      #Unresponsiveness: Likely a syncopal episode. EEG neg for seizures. MRI w/o acute cva. Sx resolved, however, she remains profoundly weak, but improving. She declined recommendation for SNF and discharges home with HH PT. Dicsussed ALF with niece, who was provided resources therein     #Sepsis: Due to UTI vs covid. Ucx with proteus, pansensitive and she will complete 3 more days of cefdinir.      #Polyarthralgia: Pain with movement of most extremities. 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 ESR and CRP quite elevated which again may be due to covid, but did eval for inflammatory arthritis and start steroids. She says she has a hx of RA but cannot remember what meds she was on. She ran out about a month ago. Improved dramatically with steroids and will dc on pred taper. RF and CCP still pending, but I did recommend she establish care with [facility] in [place] so she can get started on a DMARD again    #COVID Infection: Weakness, malaise, polyarthralgia, and intermittent hypoxemia noted. Inflammatory markers quite elevated so did elect to start steroids     #AKI: 2/2 volume depletion. Resolved     #Hypovolemic Hyponatremia: Improved with IVF     #DM2: A1c was 10% last month, but has required minimal insulin here. Started metformin 518m bid, increased to 10038mtoday    #Anxiety/Depression: Cont home meds     #Emphysema: No exacerbation; continues to smoke    PCP: ChBuzzy HanMD     Consults: None    Condition of patient at discharge: good and improved    Discharge Exam:    Physical Exam:       Gen:  Frail, nad  HEENT:  Pink conjunctivae, PERRL, hearing intact to voice, moist mucous membranes  Neck:  Supple, without masses, thyroid non-tender  Resp:  No accessory muscle use, clear breath sounds without wheezes rales or rhonchi  Card:  No murmurs, normal S1, S2 without thrills, bruits or peripheral edema  Abd:  Soft, non-tender, non-distended, normoactive bowel sounds are present  Musc:  No cyanosis or clubbing, mild warmth to bilat knees, bilat MCPs w/ warmth and synovitis, ROM improved  Skin:  No rashes or ulcers, skin turgor is good  Neuro:  Cranial nerves 3-12 are grossly intact, grip strength is 5/5 bilaterally and dorsi / plantarflexion is 5/5 bilaterally, follows commands appropriately  Psych:  Good insight, oriented to person, place and time, alert        Disposition: home    Patient Instructions:   Current Discharge Medication List        START taking these medications    Details   cefdinir  (OMNICEF) 300 mg capsule Take 1 Capsule by mouth two (2) times a day for 3 days.  Qty: 6 Capsule, Refills: 0      predniSONE (DELTASONE) 10 mg tablet Take 40 mg by mouth daily (with breakfast) for 5 days, THEN 30 mg daily (with breakfast) for 5 days, THEN 20 mg daily (with breakfast) for 5 days, THEN 10 mg daily (with breakfast) for 5 days.  Qty: 50 Tablet, Refills: 0           CONTINUE these medications which have NOT CHANGED    Details   tiotropium-olodateroL (Stiolto Respimat) 2.5-2.5 mcg/actuation inhaler Take 2 Puffs by inhalation daily.      insulin glargine (LANTUS,BASAGLAR) 100 unit/mL (3 mL) inpn 15 Units by SubCUTAneous route nightly. For diabetes  Qty: 15 mL, Refills: 0    Associated Diagnoses: Type 2 diabetes mellitus without complication, unspecified whether long term insulin use (HCC)      allopurinoL (ZYLOPRIM) 100 mg tablet Take 1 Tablet by mouth daily.  Qty: 90 Tablet, Refills: 1    Associated Diagnoses: Chronic gout without tophus, unspecified cause, unspecified site      metoprolol tartrate (LOPRESSOR) 25 mg tablet Take 1 Tablet by mouth two (2) times a day. For blood pressure  Qty: 180 Tablet, Refills: 0    Associated Diagnoses: Essential hypertension      levothyroxine (Euthyrox) 50 mcg tablet Take 1 Tablet by mouth Daily (before breakfast). For hypothyroid  Qty: 90 Tablet, Refills: 0    Associated Diagnoses: Hypothyroidism, unspecified type      insulin lispro (HUMALOG) 100 unit/mL kwikpen USE AS DIRECTED BEFORE MEAL(S) AND AT BEDTIME PER SLIDING SCALE.IF 150-200 INJECT 2 UNITS,201-250 INJECT 4 U,251-300 INJECT 6 U,301-350 INJECT 8 U,351-400 INJECT 10 U. GIVE 12U IF BS GREATER THAN 1 CALL MD.  Otho Darner: 15 mL, Refills: 0    Associated Diagnoses: Type 2 diabetes mellitus without complication, unspecified whether long term insulin use (HCC)      pantoprazole (PROTONIX) 40 mg tablet Take 1 Tablet by mouth daily. For heartburn  Qty: 90 Tablet, Refills: 0    Associated Diagnoses: Gastroesophageal  reflux disease, unspecified whether esophagitis present      sertraline (ZOLOFT) 50 mg tablet Take 1 Tablet by mouth daily. For depression  Qty: 90 Tablet, Refills: 0    Associated Diagnoses: Anxiety and depression      amitriptyline (ELAVIL) 50 mg tablet Take 1 Tablet by mouth nightly. For pain  Qty: 90 Tablet, Refills: 0    Associated Diagnoses: Anxiety and depression      atorvastatin (LIPITOR) 10 mg tablet Take 1 Tablet by  mouth daily. For cholesterol  Qty: 90 Tablet, Refills: 0    Associated Diagnoses: Mixed hyperlipidemia      topiramate (TOPAMAX) 25 mg tablet TAKE 1 TABLET BY MOUTH ONCE DAILY FOR MIGRAINE/SEIZURE.  Qty: 90 Tablet, Refills: 0    Associated Diagnoses: Seizure disorder (HCC)      Briviact 50 mg tablet Take 1 Tablet by mouth two (2) times a day. Max Daily Amount: 100 mg.  Qty: 180 Tablet, Refills: 0    Associated Diagnoses: Seizure disorder ([place])      ergocalciferol (ERGOCALCIFEROL) 1,250 mcg (50,000 unit) capsule Take 50,000 Units by mouth every Wednesday.           Activity: Activity as tolerated  Diet: Regular Diet  Wound Care: None needed    Follow-up with Buzzy Han, MD in 1 week.  Follow-up tests/labs RF, CCP    Approximate time spent in patient care on day of discharge: 35 min    Signed:  Lyn Hollingshead, MD  08/04/2021  11:42 AM

## 2021-08-05 LAB — CYCLIC CITRUL PEPTIDE AB, IGG
CCP ANTIBODIES IGG/IGA: 8 units (ref 0–19)
CCP Antibodies IgG/IgA: 8 units (ref 0–19)

## 2021-08-06 LAB — CULTURE, BLOOD, PAIRED
Culture result:: NO GROWTH
Culture: NO GROWTH

## 2021-08-07 NOTE — Progress Notes (Signed)
Care Transitions Outreach Attempt    Call within 2 business days of discharge: Yes   Attempted to reach patient for transitions of care follow up. Unable to reach patient.      Patient: Savannah Irwin Patient DOB: 1946/01/25 MRN: [id number]    Last Discharge [facility]       Date Complaint Diagnosis Description Type Department Provider    07/27/21 Altered mental status Dehydration ... ED to Hosp-Admission (Discharged) (ADMIT) Melony Overly, MD; Hazeline Junker...              Was this an external facility discharge? No      Noted following upcoming appointments from discharge chart review:   [facility] follow up appointment(s):   Future Appointments   Date Time Provider Department Center   09/21/2021 11:00 AM Joyice Faster, MD [facility]     [facility] follow up appointment(s): patient needs PCP appt

## 2021-08-11 LAB — RHEUMATOID FACTOR, IGM: Rheumatoid factor, IgM: <7 U (ref <7)

## 2021-09-21 ENCOUNTER — Encounter: Attending: Family Medicine | Primary: Family Medicine

## 2022-08-29 IMAGING — CT CT HEAD W/O CM
4 series · 16 of 47 positions shown, 18 images · non-contrast
Comparison: No pertinent prior exams available for comparison.

CLINICAL DATA: Mental status change, unknown cause. Additional
history provided: Right-sided weakness since [DATE] today.

EXAM:
CT HEAD WITHOUT CONTRAST
TECHNIQUE: Contiguous axial images were obtained from the base of the skull
through the vertex without intravenous contrast.

[Series 2: head bone · axial · 0.39mm/px · z∈[+947,+979]mm · 3 of 78 slices shown]
[im 8/78  bone]
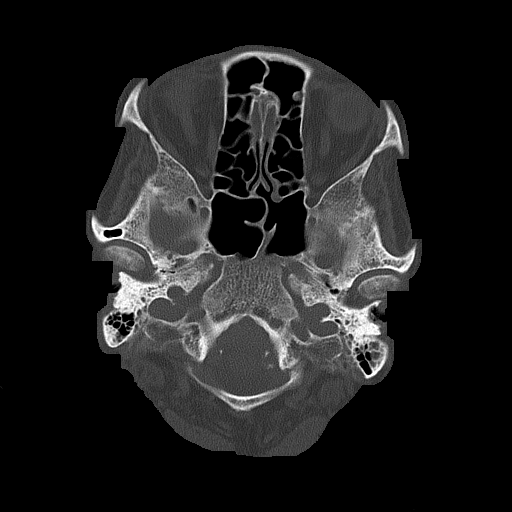
[im 16/78  bone]
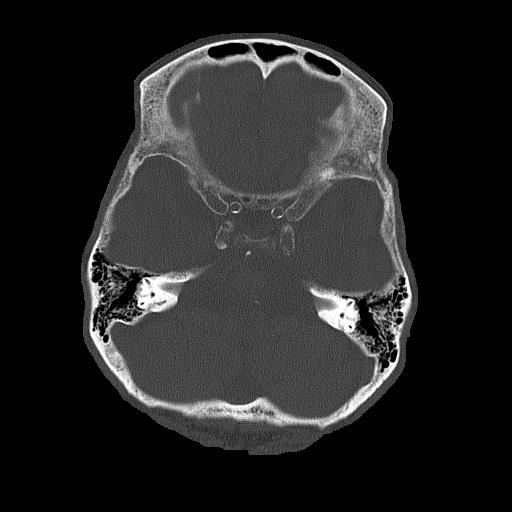
[im 24/78  bone]
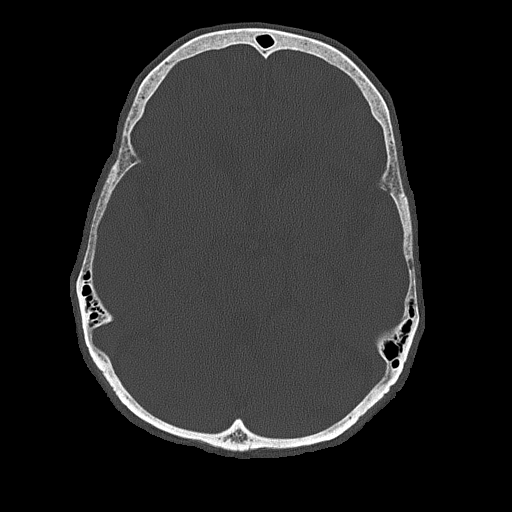

[Series 3: head wo · axial · 0.39mm/px · z∈[+948,+1063]mm · 7 of 31 slices shown, 9 images]
[im 4/31  brain]
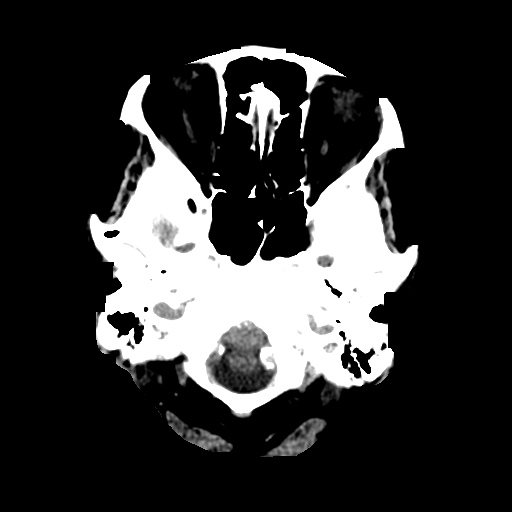
[im 4/31  bone]
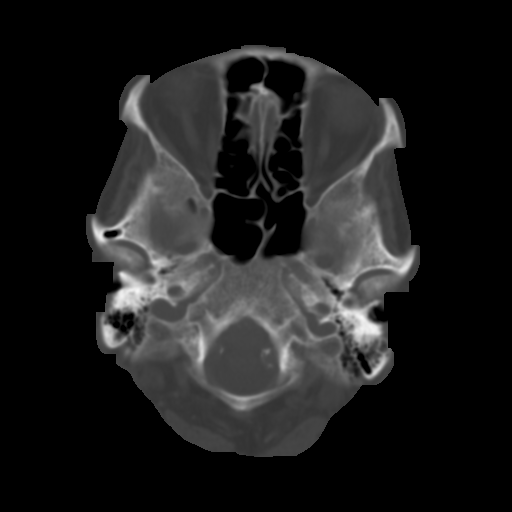
[im 8/31  brain]
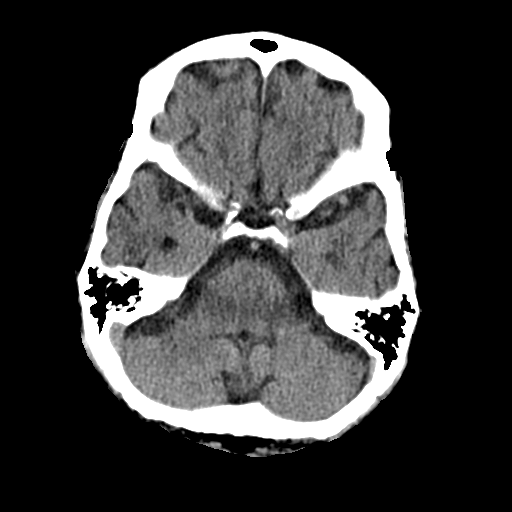
[im 12/31  brain]
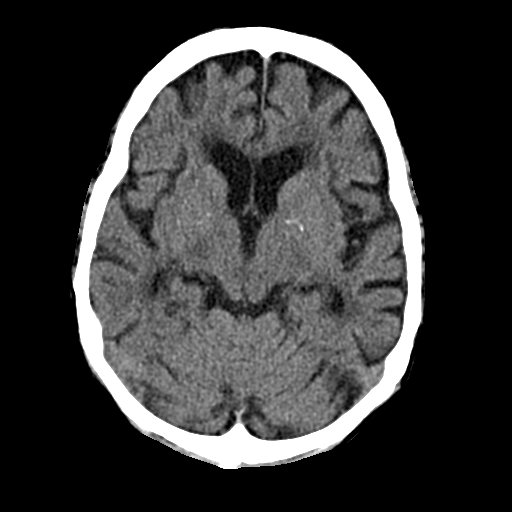
[im 16/31  brain]
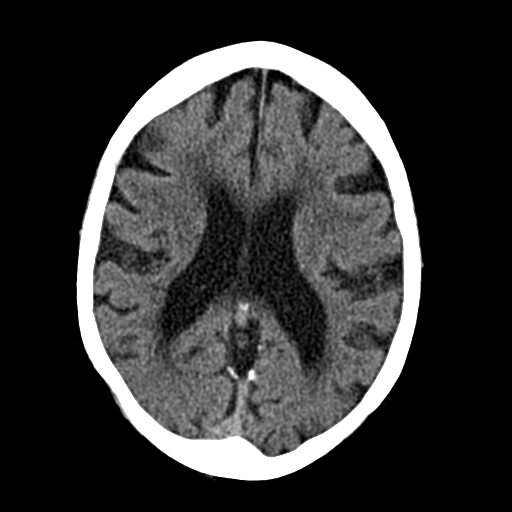
[im 19/31  brain]
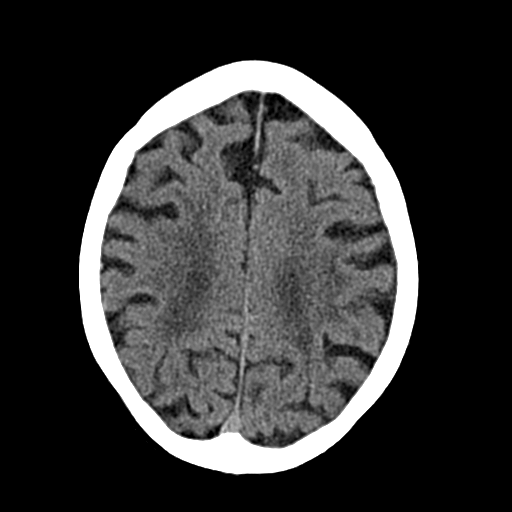
[im 19/31  bone]
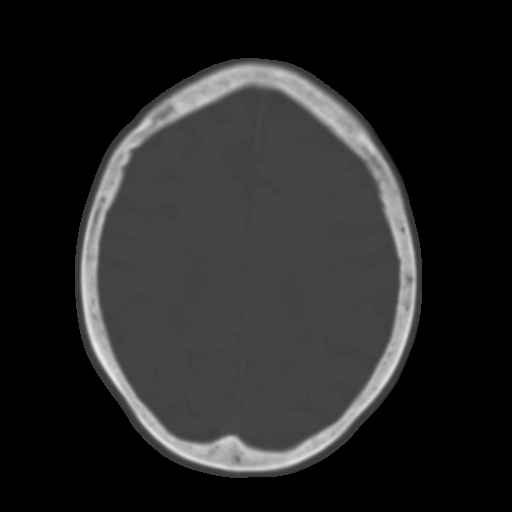
[im 23/31  brain]
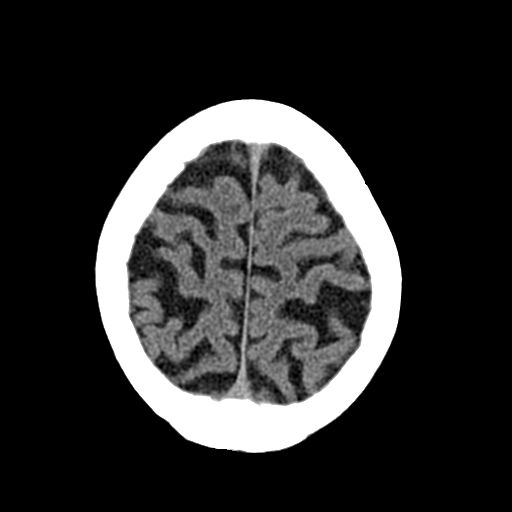
[im 27/31  brain]
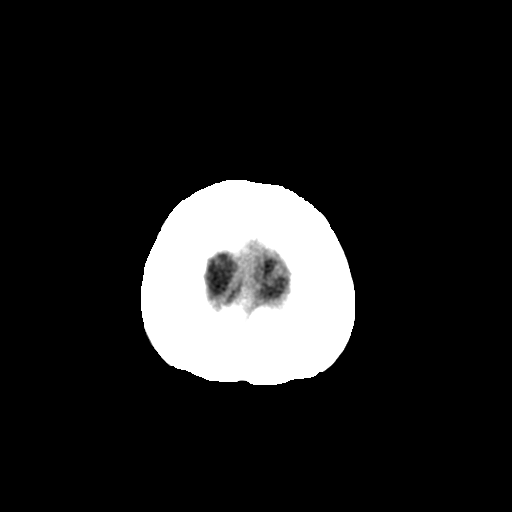

[Series 4: cor soft · coronal · 0.28mm/px · 3 of 65 slices shown]
[im 22/65  brain]
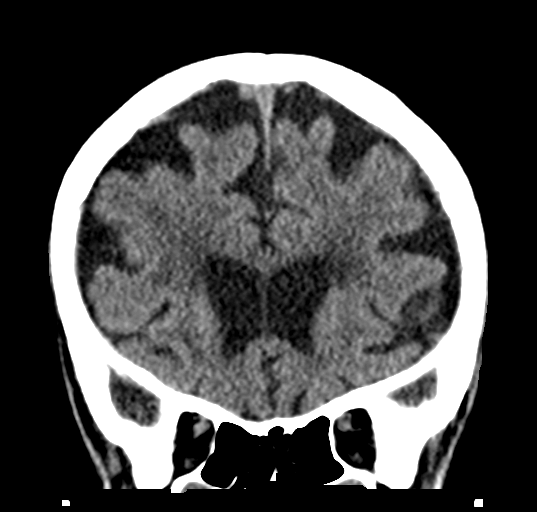
[im 29/65  brain]
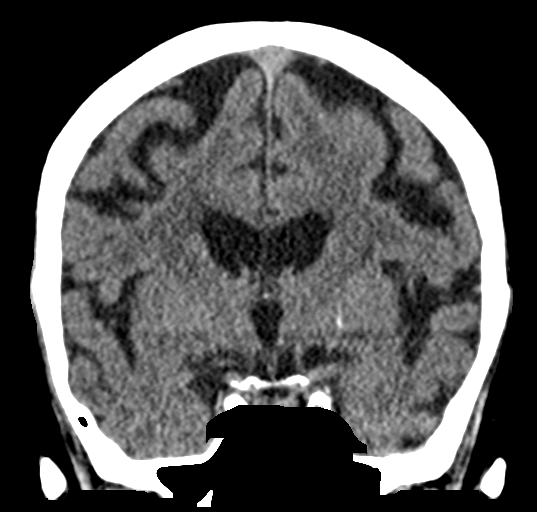
[im 36/65  brain]
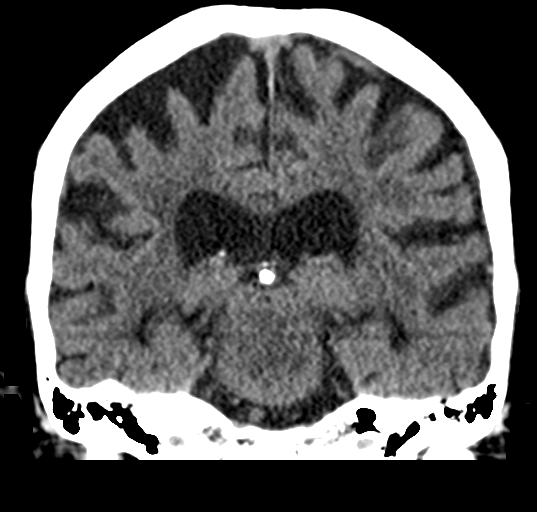

[Series 5: sag soft · sagittal · 0.28mm/px · 3 of 51 slices shown]
[im 17/51  brain]
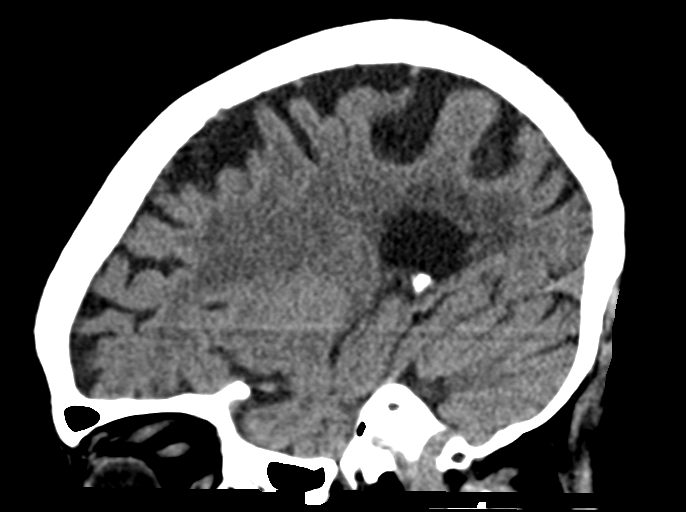
[im 26/51  brain]
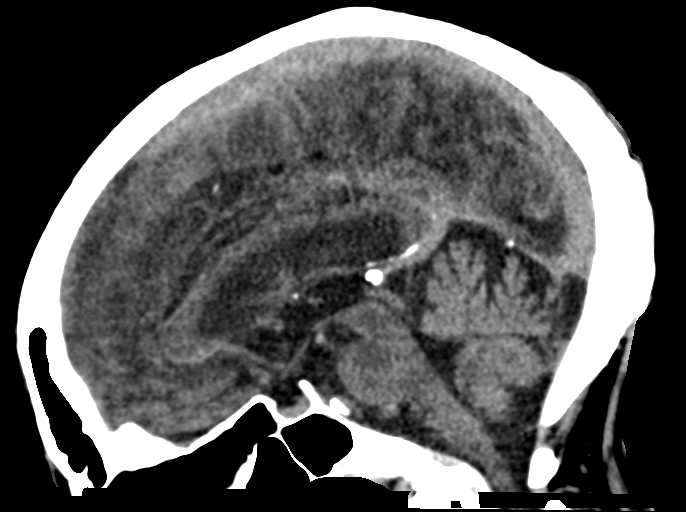
[im 34/51  brain]
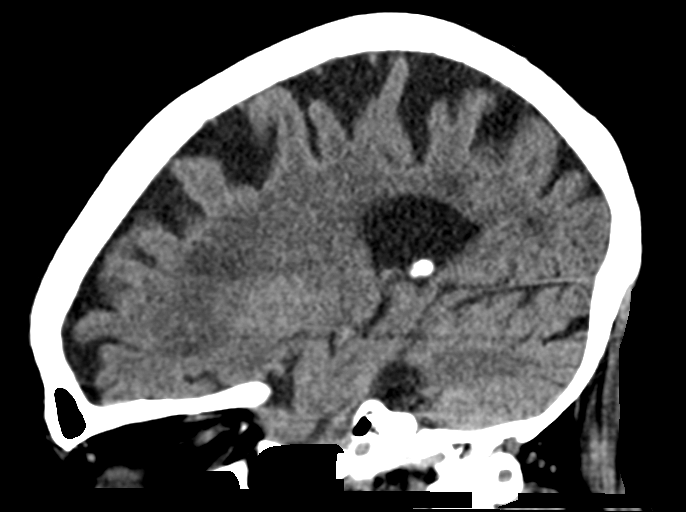

[16 of 47 positions shown; findings below may reference images not displayed]

FINDINGS: Brain:

Moderate cerebral atrophy. Associated prominence of the ventricles
and sulci.

Comparatively mild cerebellar atrophy.

Moderate ill-defined hypoattenuation within the cerebral white
matter is nonspecific, but compatible with chronic small vessel
ischemic disease.

There is no acute intracranial hemorrhage.

No demarcated cortical infarct.

No extra-axial fluid collection.

No evidence of intracranial mass.

No midline shift.

Vascular: No hyperdense vessel. Atherosclerotic calcifications.

Skull: Normal. Negative for fracture or focal lesion.

Sinuses/Orbits: Visualized orbits show no acute finding. Complete
opacification of the partially imaged right maxillary sinus.
Extensive partial opacification of the partially imaged left
maxillary sinus. Moderate ethmoid sinus mucosal thickening.
IMPRESSION: No CT evidence of acute intracranial abnormality.

Moderate cerebral atrophy and chronic small vessel ischemic disease.

Comparatively mild cerebellar atrophy.

Bilateral ethmoid and maxillary paranasal sinus disease at the
imaged levels, as described.

## 2022-09-02 IMAGING — DX DG KNEE 1-2V PORT*R*
2 series · 2 of 2 positions shown · non-contrast
Comparison: None.

CLINICAL DATA: Patient fell on knee multiple years ago.  Pain.

EXAM:
PORTABLE RIGHT KNEE - 1-2 VIEW

[knee ap]
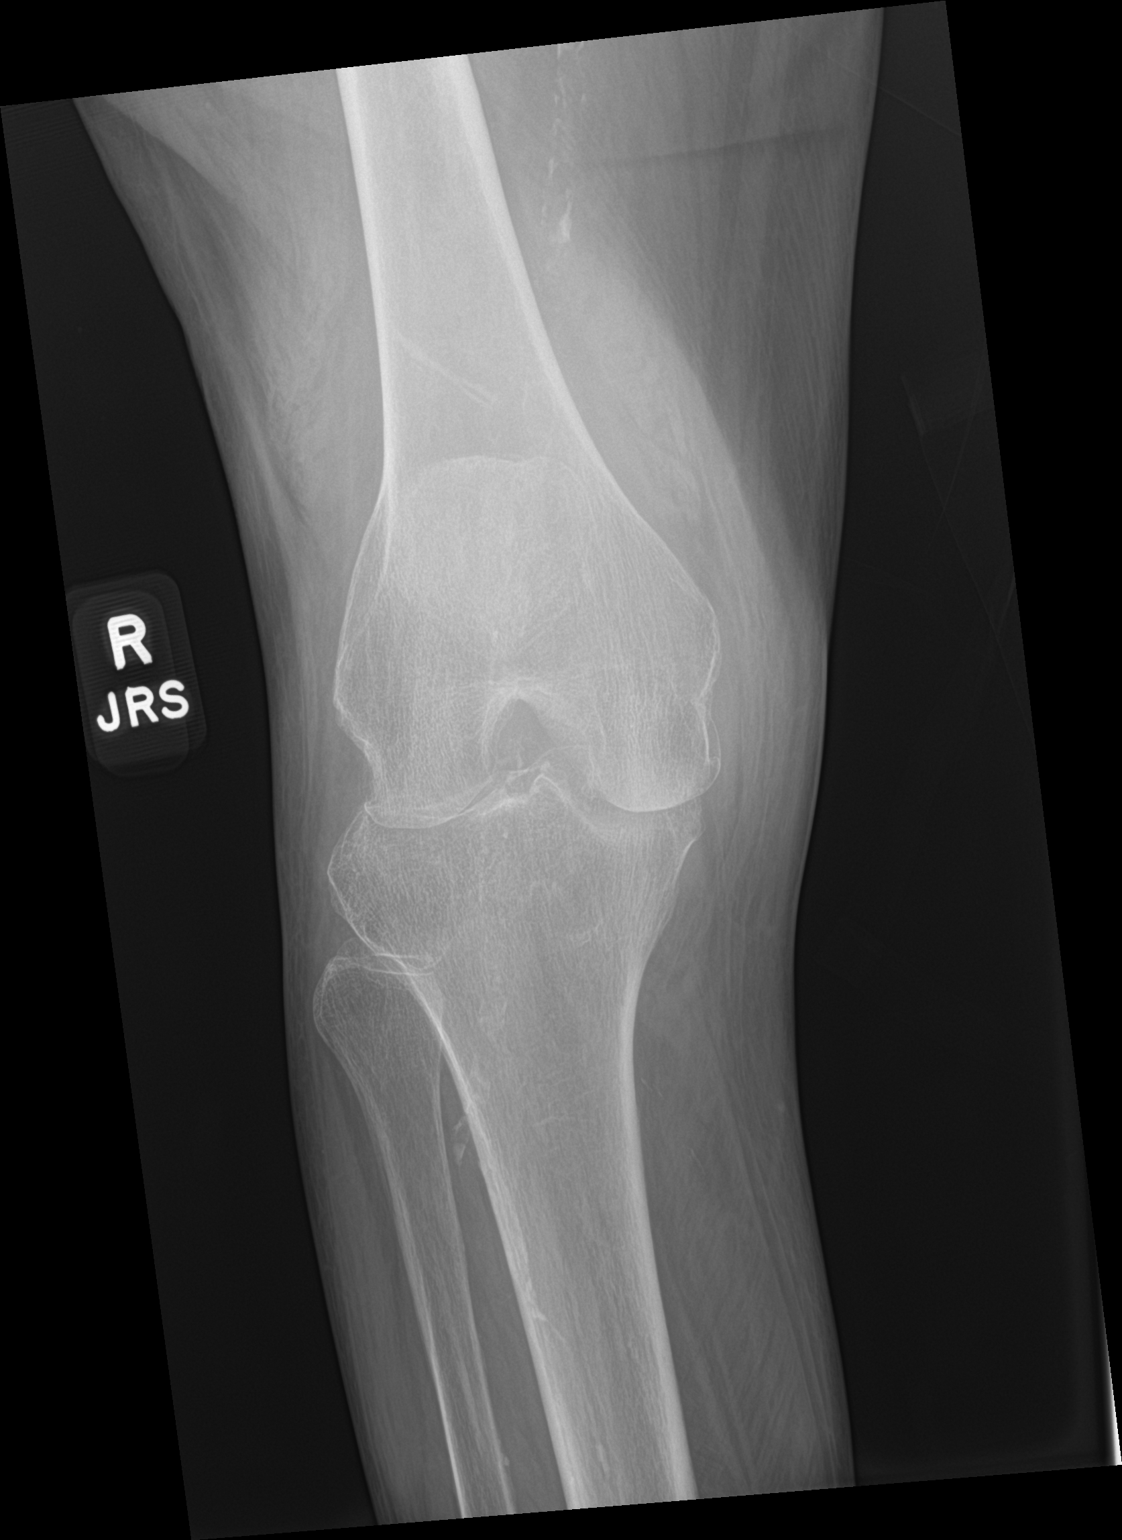

[knee lat]
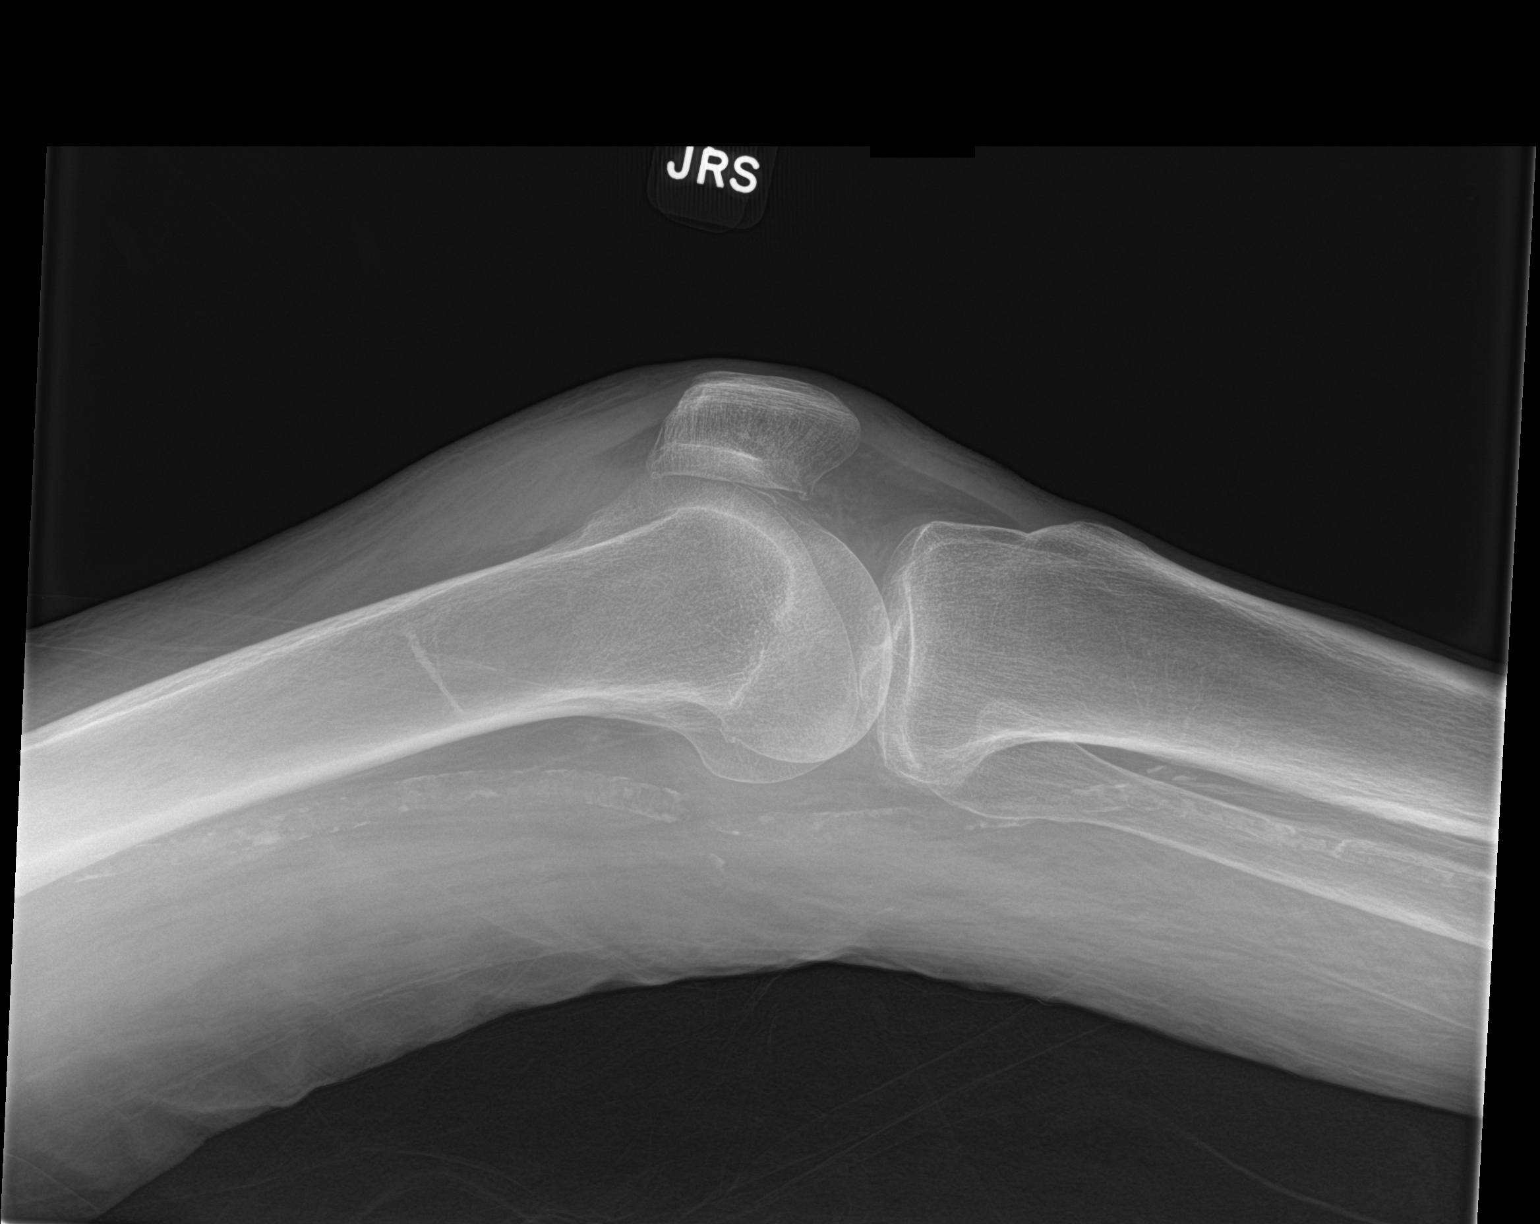

[2 of 2 positions shown; findings below may reference images not displayed]

FINDINGS: Vascular calcifications. No fractures or dislocations. Suspected
small effusion. No other abnormalities.
IMPRESSION: Suspected small effusion.  No other abnormalities.

## 2024-10-10 DEATH — deceased
